# Patient Record
Sex: Male | Born: 1950
Health system: Southern US, Community
[De-identification: ages and names within clinical notes are randomized; demographics above are authoritative.]

## PROBLEM LIST (undated history)

## (undated) DIAGNOSIS — I2510 Atherosclerotic heart disease of native coronary artery without angina pectoris: Secondary | ICD-10-CM

## (undated) DIAGNOSIS — N529 Male erectile dysfunction, unspecified: Secondary | ICD-10-CM

## (undated) DIAGNOSIS — I10 Essential (primary) hypertension: Secondary | ICD-10-CM

## (undated) DIAGNOSIS — I4891 Unspecified atrial fibrillation: Secondary | ICD-10-CM

## (undated) DIAGNOSIS — I219 Acute myocardial infarction, unspecified: Secondary | ICD-10-CM

## (undated) DIAGNOSIS — E782 Mixed hyperlipidemia: Secondary | ICD-10-CM

## (undated) DIAGNOSIS — I499 Cardiac arrhythmia, unspecified: Secondary | ICD-10-CM

## (undated) DIAGNOSIS — Z950 Presence of cardiac pacemaker: Secondary | ICD-10-CM

## (undated) HISTORY — DX: Atherosclerotic heart disease of native coronary artery without angina pectoris: I25.10

## (undated) HISTORY — DX: Mixed hyperlipidemia: E78.2

## (undated) HISTORY — PX: CATARACT EXTRACTION: SUR2

## (undated) HISTORY — DX: Essential (primary) hypertension: I10

## (undated) HISTORY — DX: Acute myocardial infarction, unspecified: I21.9

## (undated) HISTORY — DX: Male erectile dysfunction, unspecified: N52.9

## (undated) HISTORY — DX: Unspecified atrial fibrillation: I48.91

---

## 1997-11-22 ENCOUNTER — Ambulatory Visit (HOSPITAL_COMMUNITY): Admission: RE | Admit: 1997-11-22 | Discharge: 1997-11-22 | Payer: Self-pay | Admitting: Internal Medicine

## 1999-05-07 DIAGNOSIS — I219 Acute myocardial infarction, unspecified: Secondary | ICD-10-CM

## 1999-05-07 HISTORY — PX: CORONARY ARTERY BYPASS GRAFT: SHX141

## 1999-05-07 HISTORY — DX: Acute myocardial infarction, unspecified: I21.9

## 1999-10-02 ENCOUNTER — Inpatient Hospital Stay (HOSPITAL_COMMUNITY): Admission: EM | Admit: 1999-10-02 | Discharge: 1999-10-09 | Payer: Self-pay | Admitting: Cardiology

## 1999-10-04 ENCOUNTER — Encounter: Payer: Self-pay | Admitting: Cardiothoracic Surgery

## 1999-10-05 ENCOUNTER — Encounter: Payer: Self-pay | Admitting: Cardiothoracic Surgery

## 1999-10-06 ENCOUNTER — Encounter: Payer: Self-pay | Admitting: Cardiothoracic Surgery

## 1999-10-07 ENCOUNTER — Encounter: Payer: Self-pay | Admitting: Cardiothoracic Surgery

## 2004-07-20 ENCOUNTER — Ambulatory Visit: Payer: Self-pay | Admitting: Cardiology

## 2004-08-03 ENCOUNTER — Ambulatory Visit: Payer: Self-pay | Admitting: Internal Medicine

## 2005-09-02 ENCOUNTER — Ambulatory Visit: Payer: Self-pay | Admitting: Cardiology

## 2005-09-12 ENCOUNTER — Ambulatory Visit: Payer: Self-pay | Admitting: Cardiology

## 2005-10-31 ENCOUNTER — Ambulatory Visit: Payer: Self-pay | Admitting: Cardiology

## 2005-11-07 ENCOUNTER — Ambulatory Visit: Payer: Self-pay | Admitting: Cardiology

## 2006-10-13 ENCOUNTER — Ambulatory Visit: Payer: Self-pay | Admitting: Physician Assistant

## 2006-10-13 ENCOUNTER — Encounter: Payer: Self-pay | Admitting: Cardiology

## 2007-07-28 ENCOUNTER — Encounter: Payer: Self-pay | Admitting: Cardiology

## 2008-03-30 ENCOUNTER — Ambulatory Visit: Payer: Self-pay | Admitting: Cardiology

## 2008-03-30 ENCOUNTER — Encounter: Payer: Self-pay | Admitting: Physician Assistant

## 2008-04-07 ENCOUNTER — Encounter: Payer: Self-pay | Admitting: Physician Assistant

## 2008-04-08 ENCOUNTER — Ambulatory Visit: Payer: Self-pay | Admitting: Cardiology

## 2009-01-04 DIAGNOSIS — E785 Hyperlipidemia, unspecified: Secondary | ICD-10-CM

## 2009-01-04 DIAGNOSIS — I10 Essential (primary) hypertension: Secondary | ICD-10-CM | POA: Insufficient documentation

## 2009-01-04 DIAGNOSIS — I2510 Atherosclerotic heart disease of native coronary artery without angina pectoris: Secondary | ICD-10-CM

## 2009-03-06 ENCOUNTER — Telehealth (INDEPENDENT_AMBULATORY_CARE_PROVIDER_SITE_OTHER): Payer: Self-pay | Admitting: *Deleted

## 2009-05-16 ENCOUNTER — Encounter: Payer: Self-pay | Admitting: Cardiology

## 2009-05-22 ENCOUNTER — Ambulatory Visit: Payer: Self-pay | Admitting: Cardiology

## 2009-05-22 DIAGNOSIS — Z951 Presence of aortocoronary bypass graft: Secondary | ICD-10-CM

## 2010-06-05 NOTE — Assessment & Plan Note (Signed)
Summary: 1 YR FU -SRS   Visit Type:  Follow-up Primary Provider:  Dr Fara Chute  CC:  follow-up visit.  History of Present Illness: the patient is a 60 year old male with a history of coronary artery bypass grafting in May of 2001.  He status post out of hospital in inferior wall myocardial infarction.  Ejection fraction was 45% by Cardiolite in 2007.the patient is a history of hypertension and dyslipidemia his last cholesterol levels we have available demonstrate an HDL of 28 and a LDL of 116.  His EKG demonstrates old inferior Q waves.  The patient does not smoke.  He denies any chest pain shortness of breath orthopnea or PND.he has good exercise tolerance.  He reports no orthopnea PND palpitations or syncope.  He has a prior history of hypertension but reports that his blood pressure has been well-controlled with home monitoring.  Clinical Review Panels:  CXR CXR results Previous median sternotomy and coronary bypass grafting         have been performed.  The cardiac silhouette is at the upper range         normal size.  Lungs are free of infiltrates.  No pleural disease is         evident.  Bones appear average for age.                   IMPRESSION:         No acute or active process is identified. (07/04/2008)    Preventive Screening-Counseling & Management  Alcohol-Tobacco     Smoking Status: never  Current Medications (verified): 1)  Multivitamins  Tabs (Multiple Vitamin) .... Take 1 Tablet By Mouth Once A Day 2)  Niaspan 500 Mg Cr-Tabs (Niacin (Antihyperlipidemic)) .... Take 1 Tablet By Mouth Once A Day 3)  Vytorin 10-40 Mg Tabs (Ezetimibe-Simvastatin) .... Take 1 Tablet By Mouth Once A Day 4)  Metoprolol Succinate 50 Mg Xr24h-Tab (Metoprolol Succinate) .... Take 1 1/2 Tablet By Mouth Daily 5)  Lisinopril-Hydrochlorothiazide 20-25 Mg Tabs (Lisinopril-Hydrochlorothiazide) .... Take 1 Tablet By Mouth Once A Day 6)  Nitrostat 0.4 Mg Subl (Nitroglycerin) .... Place 1 Tablet  Under Tongue As Directed 7)  Aspirin 81 Mg Tbec (Aspirin) .... Take One Tablet By Mouth Daily  Allergies: 1)  Acetaminophen (Acetaminophen)  Comments:  Nurse/Medical Assistant: The patient's medications were reviewed with the patient and were updated in the Medication List. Pt brought list of medications to office visit. Cyril Loosen, RN, BSN (May 22, 2009 1:55 PM)  Past History:  Past Medical History: Last updated: 01/04/2009 CAD, NATIVE VESSEL (ICD-414.01) HYPERTENSION, UNSPECIFIED (ICD-401.9) HYPERLIPIDEMIA-MIXED (ICD-272.4) Erectile dysfunction  Family History: Last updated: 05/22/2009 noncontributory  Social History: Last updated: 05/22/2009 Full Time Tobacco Use - No.   Risk Factors: Smoking Status: never (05/22/2009)  Family History: Reviewed history and no changes required. noncontributory  Social History: Reviewed history and no changes required. Full Time Tobacco Use - No.  Smoking Status:  never  Review of Systems  The patient denies fatigue, malaise, fever, weight gain/loss, vision loss, decreased hearing, hoarseness, chest pain, palpitations, shortness of breath, prolonged cough, wheezing, sleep apnea, coughing up blood, abdominal pain, blood in stool, nausea, vomiting, diarrhea, heartburn, incontinence, blood in urine, muscle weakness, joint pain, leg swelling, rash, skin lesions, headache, fainting, dizziness, depression, anxiety, enlarged lymph nodes, easy bruising or bleeding, and environmental allergies.    Vital Signs:  Patient profile:   60 year old male Height:      74 inches  Weight:      223.50 pounds BMI:     28.80 Pulse rate:   72 / minute BP sitting:   142 / 78  (left arm) Cuff size:   regular  Vitals Entered By: Cyril Loosen, RN, BSN (May 22, 2009 1:49 PM)  Nutrition Counseling: Patient's BMI is greater than 25 and therefore counseled on weight management options. CC: follow-up visit   Physical Exam  Additional  Exam:  General: Well-developed, well-nourished in no distress head: Normocephalic and atraumatic eyes PERRLA/EOMI intact, conjunctiva and lids normal nose: No deformity or lesions mouth normal dentition, normal posterior pharynx neck: Supple, no JVD.  No masses, thyromegaly or abnormal cervical nodes lungs: Normal breath sounds bilaterally without wheezing.  Normal percussion.  Chest: Well-healed median sternotomy scar. heart: regular rate and rhythm with normal S1 and S2, no S3 or S4.  PMI is normal.  No pathological murmurs abdomen: Normal bowel sounds, abdomen is soft and nontender without masses, organomegaly or hernias noted.  No hepatosplenomegaly musculoskeletal: Back normal, normal gait muscle strength and tone normal pulsus: Pulse is normal in all 4 extremities Extremities: No peripheral pitting edema neurologic: Alert and oriented x 3 skin: Intact without lesions or rashes cervical nodes: No significant adenopathy psychologic: Normal affect    EKG  Procedure date:  05/22/2009  Findings:      normal sinus rhythm possible left atrial large medications old inferior wall myocardial infarction age undetermined no acute ischemic changes  Impression & Recommendations:  Problem # 1:  CORONARY ARTERY BYPASS GRAFT, HX OF (ICD-V45.81) although the patient is asymptomatic, I have recommended a Cardiolite stress study or stress echocardiogram.  The patient is currently refusing any further testing.  He will consider taking the test next year.  He will continue with aggressive risk factor modification, therapeutic lifestyle changes and controlled dyslipidemia.  Problem # 2:  HYPERTENSION, UNSPECIFIED (ICD-401.9) porta patient reports his blood pressure is been under good control.  He remains compliant with his medical regimen. The following medications were removed from the medication list:    Halfprin 162 Mg Tbec (Aspirin) .Marland Kitchen... Take 1 tablet by mouth once a day    Toprol Xl 50 Mg  Xr24h-tab (Metoprolol succinate) .Marland Kitchen... Take 1 1/2 tablet by mouth once a day His updated medication list for this problem includes:    Metoprolol Succinate 50 Mg Xr24h-tab (Metoprolol succinate) .Marland Kitchen... Take 1 1/2 tablet by mouth daily    Lisinopril-hydrochlorothiazide 20-25 Mg Tabs (Lisinopril-hydrochlorothiazide) .Marland Kitchen... Take 1 tablet by mouth once a day    Aspirin 81 Mg Tbec (Aspirin) .Marland Kitchen... Take one tablet by mouth daily  Problem # 3:  HYPERLIPIDEMIA-MIXED (ICD-272.4) last LDL was 116 mg percent.  At this point I recommended to the patient not to further increase medications as he tolerated Niaspan Vytorin.  Increasing simvastatin 80 mg a day will confer high risk for liver function abnormalities. The following medications were removed from the medication list:    Crestor 20 Mg Tabs (Rosuvastatin calcium) .Marland Kitchen... Take 1 tablet by mouth at bedtime His updated medication list for this problem includes:    Niaspan 500 Mg Cr-tabs (Niacin (antihyperlipidemic)) .Marland Kitchen... Take 1 tablet by mouth once a day    Vytorin 10-40 Mg Tabs (Ezetimibe-simvastatin) .Marland Kitchen... Take 1 tablet by mouth once a day  Other Orders: EKG w/ Interpretation (93000)  Patient Instructions: 1)  Your physician recommends that you continue on your current medications as directed. Please refer to the Current Medication list given to you today. 2)  Follow up in 1 year.

## 2010-09-18 NOTE — Assessment & Plan Note (Signed)
[facility]                          [facility] OFFICE NOTE   Michael Melton, Michael Melton                     MRN:          [id number]  DATE:03/30/2008                            DOB:          Jun 25, 1950    REASON FOR VISIT:  Annual followup.   Michael Melton denies any interim development of signs/symptoms suggestive  of unstable angina pectoris.  He continues to remain active both at his  job and at home.  He does, however, complain of left elbow discomfort  which has persisted for the past 3-4 months.  However, this is not  exacerbated by any exertional activity.  There appears to be some focal  point tenderness on the medial aspect.   The patient's last ischemic workup was an adequate exercise stress  Cardiolite test in May 2007.  This was felt to be a low-risk study; EF  45%.   EKG today indicates NSR at 64 bpm with nonspecific ST abnormalities.   CURRENT MEDICATIONS:  1. Niaspan 500 daily.  2. Vytorin 10/40 daily.  3. Coated aspirin.  4. Toprol-XL 75 daily.   PHYSICAL EXAMINATION:  VITAL SIGNS:  Blood pressure 176/96, pulse 63 and  regular, and weight 224 (up 4).  GENERAL:  [name], sitting upright, no distress.  HEENT:  Normocephalic and atraumatic.  NECK:  Palpable carotid pulses without bruits.  LUNGS:  Clear to auscultation in all fields.  HEART:  Regular rate and rhythm.  No significant murmurs.  ABDOMEN:  Soft, nontender, intact bowel sounds.  EXTREMITIES:  Palpable pulses with trace pedal edema.  NEUROLOGIC:  No focal deficit.   IMPRESSION:  1. Multivessel coronary artery disease.      a.     Status post out of hospital inferior myocardial infarction.      b.     Subsequent five-vessel CABG, May 2001.      c.     Adequate, low-risk exercise stress Cardiolite; EF 45%, May       2007.  2. Hypertension, controlled.  3. Dyslipidemia.   PLAN:  1. Adjust medications with addition of lisinopril/hydrochlorothiazide  10/12.5 mg daily for more aggressive blood pressure control.  Down      titrate aspirin initially to 162 daily, and ultimately 81 mg daily.      Provide prescription for p.r.n. nitroglycerin.  2. Follow up blood pressure check in 1 week, with a BMET at that time      for monitoring of electrolytes and renal function.  3. Schedule fasting lipid/liver profile for reassessment of lipid      status.  4. Schedule return clinic followup with myself and Dr. [name] in 6      months.      Michael Serpe, PA-C  Electronically Signed      [name], MD,FACC  Electronically Signed   GS/MedQ  DD: 03/30/2008  DT: 03/31/2008  Job #: [id number]   cc:   Fara Chute

## 2010-09-18 NOTE — Assessment & Plan Note (Signed)
[facility]                          [facility] OFFICE NOTE   GULED, GAHAN                     MRN:          [id number]  DATE:10/13/2006                            DOB:          1950-07-18    PRIMARY CARE PHYSICIAN:  Dr. Neita Carp.   PRIMARY CARDIOLOGIST:  Dr. [name].   HISTORY OF PRESENT ILLNESS:  Mr. Orton is a 60 year old male patient  with a history of coronary artery disease, status post inferior  myocardial infarction in 2001 with subsequent coronary artery bypass  grafting in May of 2001, who returns today for routine followup. He was  last seen in the office in June of 2007. Prior to that he had a  Cardiolite study performed. This was felt to be a low risk study. The  patient had partially reversible anterior defect of moderate size which  could represent ischemia. The patient was asymptomatic at that time and  thus was felt to be low risk. He has done well since we saw him last  year. He denies any chest discomfort or shortness of breath. Denies any  syncope. Denies any problems with edema. Denies any cough.   CURRENT MEDICATIONS:  1. Multivitamin daily.  2. Toprol XL 50 mg daily.  3. Niaspan 500 mg daily.  4. Aspirin 162 mg every 2 to 3 days.  5. Vytorin 10/80 mg daily.  6. Lisinopril 10 mg daily- he has been out of this for the last couple      of days.  7. Cialis 10 mg p.r.n.   ALLERGIES:  TYLENOL.   SOCIAL HISTORY:  Denies any tobacco abuse. He does drink quite a bit of  caffeine and does admit to some dietary salt indiscretion.   PHYSICAL EXAMINATION:  He is well-nourished, well-developed male in no  acute distress. Blood pressure 163/102, repeat blood pressure my me on  right is 150/104, on the left 158/102, manual cuff, heart rate was 81.  HEENT: Normal.  NECK: Without JVD. Carotids without bruits bilaterally.  CARDIAC: Normal S1, S2. Regular rate and rhythm without murmurs.  LUNGS: Clear to auscultation  bilaterally without wheezing, rhonchi or,  rales.  ABDOMEN: Soft nontender with normoactive bowel sounds. No organomegaly.  EXTREMITIES: Without edema, calves soft, nontender.  SKIN: Warm and dry.  NEUROLOGIC: He is alert and oriented x3. Cranial nerves II-XII grossly  intact. Dorsalis pedis and posterior tibialis pulses are 2 +  bilaterally.   Electrocardiogram reveals sinus rhythm with a heart rate of 77, leftward  axis, inferior Q waves. No significant change since previous tracing.   IMPRESSION:  1. Coronary artery disease.      a.     Status post inferior myocardial infarction in 2001.      b.     Status post coronary artery bypass grafting in May of 2001:       LIMA to the LAD right internal mammary to the first obtuse       marginal, saphenous vein graft to the obtuse marginal, saphenous       vein graft to the diagonal, saphenous to posterior descending  artery.      c.     Cardiolite study May 2007 revealing possible anterior       ischemia- felt to be low risk/the patient was asymptomatic.  2. Hypertension, uncontrolled.  3. Dyslipidemia.  4. Erectile dysfunction.   PLAN:  The patient presents to the office today for follow up. Overall  from a cardiovascular standpoint he is doing well. He does need better  blood pressure control. I have increased his Toprol to 75 mg a day. I  have also asked him to get back on the lisinopril at 10 mg a day. He has  a blood pressure machine at work and he is going to keep track on his  blood pressures. If they are greater than or equal to 160/100 he is to  contact us. Otherwise he will return to the office in about 2 to 3 weeks  for blood pressure check with the nurse. If he continues to have  elevated blood pressures I would rather to try to go up on his  lisinopril then to add a diuretic, as he does work at a [employer] in  extremely hot conditions. He does need a follow up on his lipids and  LFTs. We will arrange that. He also  is requesting a refill on his  Cialis. This was prescribed to him by Roxanne Mins, PA-C some years  ago. I have refilled that for him today but have asked him to see if his  primary care physician will refill it for him in the future. I did warn  him about the contra-indications of nitroglycerin with Cialis. He will  return for follow up in 4 months to check his blood pressure and review  the results of his lipid panel.      Tereso Newcomer, PA-C  Electronically Signed      Luis Abed, MD, [facility]  Electronically Signed   SW/MedQ  DD: 10/13/2006  DT: 10/13/2006  Job #: [id number]   cc:   Fara Chute

## 2010-09-21 NOTE — Op Note (Signed)
[place]  Patient:    Michael Melton, Michael Melton                     MRN: [id number] Proc. Date: 10/04/99 Adm. Date:  [id number] Attending:  Mikey Melton CC:         CVTS office             Michael Melton. Michael Melton, M.D.             Michael Melton                           Operative Report  PREOPERATIVE DIAGNOSIS:  Class 4 post-infarction unstable angina, status post recent diaphragmatic myocardial infarct.  POSTOPERATIVE DIAGNOSIS:  Class 4 post-infarction unstable angina, status post recent diaphragmatic myocardial infarct.  OPERATION:  Coronary artery bypass grafting x 5 (left internal mammary artery to LAD, free right internal mammary artery to OM1, saphenous vein graft to OM2, saphenous vein graft to diagonal, saphenous vein graft to posterior descending branch of the right coronary).  SURGEON:  Michael Melton, M.D.  ASSISTANT:  Michael Melton, P.A.-C. and Michael Melton, P.A.-C.  ANESTHESIA:  General by Michael Melton. Michael Melton, M.D.  INDICATIONS:  The patient is a 60 year old white male who presented 48 hours after an episode of acute chest pain and ruled in for MI.  He was stabilized with heparin and nitroglycerin, and underwent diagnostic cath which demonstrated severe three vessel disease including total occlusion of the right coronary, and mild inferior wall hypokinesia.  He was referred for coronary revascularization.  Prior to the operation, I examined the patient in his hospital room following the cath, and reviewed the results of the catheterization with the patient and his family.  I discussed the indications and expected benefits of coronary artery bypass grafting, as well as the alternatives to surgical therapy for his coronary disease.  I reviewed the major aspects of the operation including the choice of conduit, the location of the surgical incisions, use of cardiopulmonary bypass and general anesthesia, and the expected  hospital recovery.  I reviewed the associated risks of the operation including the risks of MI, CVA, bleeding, infection, and death.  He understood the indications for surgery and agreed to proceed with the operation as planned under informed consent.  OPERATIVE FINDINGS:  The patients heart is mildly dilated.  His mammary arteries were approximately 1.5 mm to 2 mm with good flow.  The saphenous vein was of average to above average quality.  The inferior wall had recent evidence of mild infarct.  The right coronary was diffusely diseased and the graft to the right was placed distally in the posterior descending.  For that reason, the right mammary artery was used to the large first circumflex vessel.  DESCRIPTION OF PROCEDURE:  The patient was brought to the operating room and placed supine on the operating room table.  General anesthesia was induced under invasive and hemodynamic monitoring.  The chest, abdomen, and legs were prepped with Betadine and draped as a sterile field.  A median sternotomy was performed and both internal mammary arteries were harvested as pedicle grafts from the subclavian vessels.  The vein was harvested from the right lower extremity.  Heparin was administered and the ACT was documented as being therapeutic.  The patient was placed on cardiopulmonary bypass after being cannulated to the ascending aorta and right atrium.  The heart was inspected and  found to have inferior wall edema and evidence of recent mild infarct. The coronaries were identified and the mammary arteries were prepared for the distal anastomoses.  A cardioplegia cannula was placed and the patient was cooled to 28 degrees.  As the aortic cross-clamp was applied, 500 cc of cold blood cardioplegia was delivered to the aortic root with immediate cardioplegic arrest and septal temperature dropping less than 12 degrees. ________ saline slush was used to augment myocardial preservation and  a pericardial insufflator pad was used to protect the left phrenic nerve.  The distal coronary anastomoses were then performed.  The first distal anastomosis was to the first diagonal.  It was a 1.8 mm vessel with proximal 90% stenosis and a reverse saphenous vein was sewn end-to-side with a running 7-0 Prolene.  The second distal anastomosis was to the posterior descending which was a 1.8 mm vessel with heavy proximal calcification and total occlusion.  A reverse saphenous vein was sewn end-to-side with a running 7-0 Prolene.  There was good flow through the graft.  The third distal anastomosis was to the OM2 which was a 1.5 mm vessel with proximal 90% stenosis, and reverse saphenous vein was sewn to this vessel with a running 7-0 Prolene with good flow through the graft.  The cardioplegia was redosed.  The fourth distal anastomosis was a free right mammary artery to the OM1 which is a large 1.8 mm vessel with proximal 90% stenosis.  An end-to-side anastomosis using running 8-0 Prolene was constructed with good flow through the graft.  The fifth distal anastomosis was to the mid LAD which was a 2.0 mm vessel with proximal 90% stenosis.  The left internal mammary artery pedicle was brought through an opening created in the left lateral pericardium.  It was brought down on the LAD and sewn end-to-side with a running 8-0 Prolene.  There was excellent flow through the anastomosis with immediate rise in septal temperature after release of the pedicle clamp on the mammary artery.  The mammary and pedicle was secured to the epicardium and the aortic cross-clamp was removed.  The heart was noted to resume a spontaneous rhythm.  A partial occluding clamp was placed on the ascending aorta and three proximal vein anastomoses were performed using a 4.0 mm punch and running 6-0 Prolene.  The ascending aorta did have some thickening and atherosclerotic changes.  The partial clamp was removed  and the vein grafts were perfused.  The free right mammary graft was  sewn to the proximal portion of the vein graft to the circumflex marginal #2. This was constructed with a running 7-0 Prolene in an end-to-side fashion between the mammary and the front of the vein.  The partial vascular clamps were removed from the vein and there was excellent flow through all the grafts.  The patient was rewarmed to 37 degrees and temporary pacing wires were applied.  When the patient reached 37 degrees, the lungs re-expanded and the ventilator was resumed.  The patient weaned from cardiopulmonary bypass without inotropics and without blood products.  Protamine was administered and the cannulas were removed.  The mediastinum was irrigated with warm antibiotic irrigation and hemostasis was adequate.  The leg incision was irrigated and closed in the standard fashion.  The pericardium was loosely reapproximated superiorly over the aorta and vein grafts.  Two mediastinal and two pleural chest tubes were placed and brought out through separate incisions.  The sternum was reapproximated with interrupted steel wire.  The pectoralis fascia  and subcutaneous layers were closed with a running Vicryl.  The skin was closed with staples and sterile dressings were applied.  Total cardiopulmonary bypass time was 165 minutes with aortic cross-clamp time of 60 minutes. DD:  10/04/99 TD:  10/09/99 Job: [id number]

## 2010-09-21 NOTE — Discharge Summary (Signed)
[place]  Patient:    Michael Melton, Michael Melton                     MRN: [id number] Adm. Date:  [id number] Disc. Date: [id number] Attending:  [name] Dictator:   Eugenia Pancoast, P.A. CC:         Madolyn Frieze. Jens Som, M.D. [facility]             [name], M.D.             [facility], [place], [place]             Luis Abed, M.D. [facility]                           Discharge Summary  DATE OF BIRTH:  Oct 02, 1980  ADDENDUM:  While the patient was in the hospital, he was noted to have elevated blood sugars from 126-146 range and, because of this he was referred to [facility] for diabetes nutrition management.  This is for control of new onset of diabetes. DD:  10/08/99 TD:  10/11/99 Job: [id number]

## 2010-09-21 NOTE — Discharge Summary (Signed)
[place]. [facility]  Patient:    Michael Melton, Michael Melton                     MRN: [id number] Adm. Date:  [id number] Disc. Date: [id number] Attending:  [name] Dictator:   Eugenia Pancoast, P.A. CC:         [name], M.D.             [facility],             [place], [place]             Luis Abed, M.D. [facility]             Thomas D. Riley Kill, M.D. [facility]                           Discharge Summary  DATE OF BIRTH:  November 27, 1950  FINAL DIAGNOSIS:  Coronary artery disease.  PROCEDURES:  Oct 03, 1999, left heart catheterization per Dr. Arturo Morton. Riley Kill, M.D.  Oct 04, 1999, coronary artery bypass x 5 with the left internal mammary artery to the left anterior descending, free right internal mammary artery to the first obtuse marginal, a saphenous vein graft to the second obtuse marginal, a saphenous vein graft to the diagonal, a saphenous vein graft to the posterior descending artery.  SURGEON:  [name], M.D.  HISTORY OF PRESENT ILLNESS:  This is a 60 year old gentleman without any prior history of hypertension, diabetes, or elevated lipids, or tobacco use.  He has no prior history of any cardiac problems.  He presents to the emergency room at [facility] after onset of substernal epigastric pain "like gas."  On that day, patient had had six hot dogs and two loaded burgers at about 3:30 p.m. that day.  Later that evening, the above symptoms occurred. Patient took Rolaids without relief.   He had profuse diaphoresis, some shortness of breath, and he vomited x 1 at 6:00 a.m. on Sunday.  He had some weakness and left shoulder pain.  He rested on Sunday and took an aspirin x 1, and then he went to work on Monday.  But, because of persistent left shoulder discomfort, he sought medical attention with [facility].  At that time, an EKG was done which showed Q waves in II, III, and aVF.  Because of  these occurrences, patient was transferred to [facility].  Patient was subsequently admitted to [facility] on Oct 02, 1999.  HOSPITAL COURSE:  Patient was admitted and on the following day, Oct 03, 1999, he underwent a left heart catheterization which showed significant three-vessel disease.  Because of these findings, he was referred to Dr. Donata Clay for surgical revascularization.  Patient had a total right coronary artery.  He had mild inferior hypokinesis and significant three vessel disease with a 70% proximal stenosis of the LAD and 80% proximal stenosis of the circumflex.  Because of these findings, as noted, he was referred to Dr. Donata Clay.  Dr. Donata Clay saw the patient.  He discussed the surgery.  Risks and benefits were explained to the patient.  The patient understood and agreed to surgery.  On Oct 04, 1999, patient underwent coronary artery bypass x 5 with the LIMA to the LAD, a free RIMA to the first obtuse marginal, a saphenous vein graft to the second obtuse marginal,  a saphenous vein graft to the diagonal branch, and saphenous vein graft to the posterior descending artery.  Patient tolerated the procedure well.  No intraoperative complications occurred.  Postoperatively, patient did satisfactorily.  He was extubated on the operative day.  He progressed in a satisfactory manner.  On the first postoperative day, he was stable without any complaints at that time.  He continued to progress in a satisfactory manner.  He was transferred to the step-down unit on the second postoperative day.  There, he continued to do well, went through his cardiac rehab phase without difficulty.  His incision was healing satisfactorily.  He did have some mild sinus tachycardia noted on postoperative day #3 but this resolved.  He remained in sinus rhythm throughout his stay.  No other untoward events occurred during his stay.  He progressed well, tolerating his diet.  By October 08, 1999, he was prepared for discharge.  DISCHARGE MEDICATIONS: 1. Lopressor 50 mg one-half tablet q.12h. 2. Enteric-coated aspirin 325 mg q.d. 3. Multivitamin with zinc and folic acid one q.d. 3. Tylox one to two p.o. q.4-6h. p.r.n. pain.  FOLLOW-UP:  Patient will follow up with Dr. Myrtis Ser in [place], [place] on October 23, 1999.  He will come to Dr. Vincent Gros office on October 16, 1999, for staple removal.  He will then see Dr. Donata Clay in approximately three weeks.  CONDITION ON DISCHARGE:  Patient is subsequently discharged home in satisfactory and stable condition on October 09, 1999.DD:  10/08/99 TD:  10/10/99 Job: [id number]

## 2010-10-30 ENCOUNTER — Encounter: Payer: Self-pay | Admitting: Cardiology

## 2010-11-01 ENCOUNTER — Other Ambulatory Visit: Payer: Self-pay | Admitting: *Deleted

## 2010-11-01 MED ORDER — LISINOPRIL-HYDROCHLOROTHIAZIDE 20-25 MG PO TABS: 1.0000 | ORAL_TABLET | Freq: Every day | ORAL | Status: DC

## 2011-04-03 ENCOUNTER — Other Ambulatory Visit: Payer: Self-pay | Admitting: Cardiology

## 2011-06-03 ENCOUNTER — Encounter: Payer: Self-pay | Admitting: *Deleted

## 2011-06-03 ENCOUNTER — Ambulatory Visit (INDEPENDENT_AMBULATORY_CARE_PROVIDER_SITE_OTHER): Payer: Self-pay | Admitting: Cardiology

## 2011-06-03 VITALS — BP 152/93 | HR 60 | Ht 74.0 in | Wt 215.0 lb

## 2011-06-03 DIAGNOSIS — I219 Acute myocardial infarction, unspecified: Secondary | ICD-10-CM

## 2011-06-03 DIAGNOSIS — I10 Essential (primary) hypertension: Secondary | ICD-10-CM

## 2011-06-03 DIAGNOSIS — I2510 Atherosclerotic heart disease of native coronary artery without angina pectoris: Secondary | ICD-10-CM

## 2011-06-03 DIAGNOSIS — Z951 Presence of aortocoronary bypass graft: Secondary | ICD-10-CM

## 2011-06-03 NOTE — Patient Instructions (Signed)
Continue all current medications. Your physician wants you to follow up in:  1 year.  You will receive a reminder letter in the mail one-two months in advance.  If you don't receive a letter, please call our office to schedule the follow up appointment   

## 2011-06-08 ENCOUNTER — Encounter: Payer: Self-pay | Admitting: Cardiology

## 2011-06-08 DIAGNOSIS — Z951 Presence of aortocoronary bypass graft: Secondary | ICD-10-CM | POA: Insufficient documentation

## 2011-06-08 DIAGNOSIS — I10 Essential (primary) hypertension: Secondary | ICD-10-CM | POA: Insufficient documentation

## 2011-06-08 DIAGNOSIS — I219 Acute myocardial infarction, unspecified: Secondary | ICD-10-CM | POA: Insufficient documentation

## 2011-06-08 NOTE — Progress Notes (Signed)
Michael Bottoms, MD, [facility] Board Certified in Adult Cardiovascular Medicine,Internal Medicine and Critical Care Medicine    CC: Follow up patient with coronary artery disease  HPI:  The patient is a 61 year old male with history of coronary artery bypass grafting in May of 2001.  He has an ejection fraction of 45%.  He had a nonischemic Cardiolite in 2007.  Since that time he has declined any further stress testing.  Most recently last year he declined a stress test.  He reports however no symptoms of chest pain or shortness of breath orthopnea or PND.  He has no palpitations or syncope.  He has good exercise tolerance.  He states that his blood pressure is well controlled at home.   PMH: reviewed and listed in Problem List in Electronic Records (and see below) Past Medical History  Diagnosis Date  . CAD in native artery   . Unspecified essential hypertension   . Hyperlipidemia, mixed   . Erectile dysfunction   . Status post coronary artery bypass grafting     May 2001  . Myocardial infarction     Out of hospital myocardial infarction/inferior wall, status post Cardiolite study 2007.  Ejection fraction 45%  . Hypertension    No past surgical history on file.  Allergies/SH/FHX : available in Electronic Records for review  Allergies  Allergen Reactions  . Acetaminophen     REACTION: hallucinations   History   Social History  . Marital Status: Married    Spouse Name: N/A    Number of Children: N/A  . Years of Education: N/A   Occupational History  . Full time [employer]   Social History Main Topics  . Smoking status: Never Smoker   . Smokeless tobacco: Never Used  . Alcohol Use: Not on file  . Drug Use: Not on file  . Sexually Active: Not on file   Other Topics Concern  . Not on file   Social History Narrative  . No narrative on file   No family history on file.  Medications: Current Outpatient Prescriptions  Medication Sig Dispense Refill  . aspirin 81  MG tablet Take 81 mg by mouth daily.        Marland Kitchen CIALIS 10 MG tablet Take 1 tablet by mouth Once daily as needed.      . ezetimibe-simvastatin (VYTORIN) 10-40 MG per tablet Take 1 tablet by mouth daily.        Marland Kitchen lisinopril-hydrochlorothiazide (PRINZIDE,ZESTORETIC) 20-25 MG per tablet Take 1 tablet by mouth daily. PATIENT NEED OFFICE VISIT  90 tablet  0  . metoprolol (TOPROL-XL) 50 MG 24 hr tablet TAKE 1 AND 1/2 TABLETS     DAILY  45 tablet  0  . Multiple Vitamin (MULTIVITAMIN) tablet Take 1 tablet by mouth daily. Doesn't take this everyday      . niacin (NIASPAN) 500 MG CR tablet Take 500 mg by mouth daily.        . nitroGLYCERIN (NITROSTAT) 0.4 MG SL tablet Place 0.4 mg under the tongue every 5 (five) minutes as needed.         ROS: No nausea or vomiting. No fever or chills.No melena or hematochezia.No bleeding.No claudication  Physical Exam: BP 152/93  Pulse 60  Ht 6\' 2"  (1.88 m)  Wt 215 lb (97.523 kg)  BMI 27.60 kg/m2 General:well-nourished white male in no distress Neck:normal carotid upstroke.  No carotid bruits.  No thyromegaly.  Non-nodular thyroid Lungs:clear breath sounds bilaterally Cardiac:regular rate and rhythm  with normal S1 and S2 and no murmur rubs or gallops Vascular:no edema.  Normal distal pulses Skin:warm and dry Physcologic:normal affect  12lead ZOX:WRUEAV sinus rhythm and poor R-wave progression with Q waves in leads 23 and aVF consistent with an old inferior wall myocardial infarction Limited bedside ECHO:ejection fraction is 60% without definite wall motion abnormalities   Patient Active Problem List  Diagnoses  . HYPERLIPIDEMIA-MIXED  . HYPERTENSION, UNSPECIFIED  . CAD, NATIVE VESSEL  . CORONARY ARTERY BYPASS GRAFT, HX OF  . Hypertension  . Myocardial infarction  . Status post coronary artery bypass grafting    PLAN   Patient remains asymptomatic.  We will continue risk factor modification.  He again declined any further stress testing  We did  perform a limited bedsideechocardiogram demonstrating normal LV function and no wall motion abnormalities.  I asked the patient to monitor his blood pressure carefully at home as again it was elevated in the office today.  However he assures me that his blood pressure readings have been normal at home.

## 2012-09-03 ENCOUNTER — Ambulatory Visit: Payer: 59 | Admitting: Cardiology

## 2012-09-14 ENCOUNTER — Encounter: Payer: Self-pay | Admitting: Cardiology

## 2012-09-14 ENCOUNTER — Encounter: Payer: Self-pay | Admitting: *Deleted

## 2012-09-14 ENCOUNTER — Ambulatory Visit (INDEPENDENT_AMBULATORY_CARE_PROVIDER_SITE_OTHER): Payer: 59 | Admitting: Cardiology

## 2012-09-14 VITALS — BP 137/80 | HR 69 | Ht 74.0 in | Wt 216.1 lb

## 2012-09-14 DIAGNOSIS — I10 Essential (primary) hypertension: Secondary | ICD-10-CM

## 2012-09-14 DIAGNOSIS — Z79899 Other long term (current) drug therapy: Secondary | ICD-10-CM

## 2012-09-14 DIAGNOSIS — Z951 Presence of aortocoronary bypass graft: Secondary | ICD-10-CM

## 2012-09-14 DIAGNOSIS — E785 Hyperlipidemia, unspecified: Secondary | ICD-10-CM

## 2012-09-14 DIAGNOSIS — I219 Acute myocardial infarction, unspecified: Secondary | ICD-10-CM

## 2012-09-14 DIAGNOSIS — I2510 Atherosclerotic heart disease of native coronary artery without angina pectoris: Secondary | ICD-10-CM

## 2012-09-14 MED ORDER — ATORVASTATIN CALCIUM 40 MG PO TABS: 40.0000 mg | ORAL_TABLET | Freq: Every day | ORAL | Status: DC

## 2012-09-14 NOTE — Patient Instructions (Addendum)
Your physician recommends that you schedule a follow-up appointment in: 3 months. Your physician has recommended you make the following change in your medication: STOP NIASPAN. START LIPITOR 40 MG DAILY. Your new prescription has been sent to your pharmacy. All other medications will remain the same. Your physician has requested that you have an exercise tolerance test. For further information please visit [email or web address] Please also follow instruction sheet, as given. Your physician recommends that you return for a FASTING lipid/liver profile in 10 week. Around November 23, 2012, Please have your fasting lab work done at [facility].

## 2012-09-14 NOTE — Progress Notes (Signed)
HPI The patient presents for first time visit with me.  He has a history of coronary disease with bypass grafting in 2001. His last stress perfusion study was 2007. I did review some ER records and he was recently in the emergency room with hypertension. Since that visit a couple of days ago his blood pressure has been well controlled. He otherwise has been feeling relatively well. He has had some soreness in his left upper chest that he associates with doing some work and reaching in a certain way on a ladder. He says it's sort out to move his arm in a certain direction. This is not like his previous angina. He is able reactive. He's not having any chest pressure, neck or arm discomfort consistent with angina. He's not having any palpitations, presyncope or syncope. He does have muscle aches and reports flushing with niacin.  Allergies  Allergen Reactions  . Acetaminophen     REACTION: hallucinations    Current Outpatient Prescriptions  Medication Sig Dispense Refill  . aspirin 81 MG tablet Take 81 mg by mouth daily.        Marland Kitchen CIALIS 10 MG tablet Take 1 tablet by mouth Once daily as needed.      Marland Kitchen lisinopril (PRINIVIL,ZESTRIL) 10 MG tablet Take 10 mg by mouth daily.      Marland Kitchen lisinopril-hydrochlorothiazide (PRINZIDE,ZESTORETIC) 20-25 MG per tablet Take 1 tablet by mouth daily. PATIENT NEED OFFICE VISIT  90 tablet  0  . metoprolol (TOPROL-XL) 50 MG 24 hr tablet TAKE 1 AND 1/2 TABLETS     DAILY  45 tablet  0  . Multiple Vitamin (MULTIVITAMIN) tablet Take 1 tablet by mouth daily. Doesn't take this everyday      . nitroGLYCERIN (NITROSTAT) 0.4 MG SL tablet Place 0.4 mg under the tongue every 5 (five) minutes as needed.       Marland Kitchen atorvastatin (LIPITOR) 40 MG tablet Take 1 tablet (40 mg total) by mouth daily.  90 tablet  3   No current facility-administered medications for this visit.    Past Medical History  Diagnosis Date  . CAD in native artery   . Unspecified essential hypertension   .  Hyperlipidemia, mixed   . Erectile dysfunction   . Myocardial infarction     Out of hospital myocardial infarction/inferior wall, status post Cardiolite study 2007.  Ejection fraction 45%    Past Surgical History  Procedure Laterality Date  . Coronary artery bypass graft      2001.  LIMA to LAD, SVG to OM1, SVG to OM 2, SVG to diagonal, SVG to PDA.  . Cataract extraction      ROS:  As stated in the HPI and negative for all other systems.  PHYSICAL EXAM BP 137/80  Pulse 69  Ht 6\' 2"  (1.88 m)  Wt 216 lb 1.9 oz (98.031 kg)  BMI 27.74 kg/m2  SpO2 95% GENERAL:  Well appearing HEENT:  Pupils equal round and reactive, fundi not visualized, oral mucosa unremarkable NECK:  No jugular venous distention, waveform within normal limits, carotid upstroke brisk and symmetric, no bruits, no thyromegaly LYMPHATICS:  No cervical, inguinal adenopathy LUNGS:  Clear to auscultation bilaterally BACK:  No CVA tenderness CHEST:  Well healed sternotomy scar. HEART:  PMI not displaced or sustained,S1 and S2 within normal limits, no S3, no S4, no clicks, no rubs, no murmurs ABD:  Flat, positive bowel sounds normal in frequency in pitch, no bruits, no rebound, no guarding, no midline pulsatile mass, no  hepatomegaly, no splenomegaly EXT:  2 plus pulses throughout, no edema, no cyanosis no clubbing SKIN:  No rashes no nodules NEURO:  Cranial nerves II through XII grossly intact, motor grossly intact throughout PSYCH:  Cognitively intact, oriented to person place and time  EKG:  Sinus rhythm, rate 63, old inferior infarct, poor anterior R wave progression, premature ventricular contractions, nonspecific lateral T-wave flattening. 09/04/12  ASSESSMENT AND PLAN  HYPERLIPIDEMIA-MIXED :  We spent a great deal of time discussing this and I reviewed previous records. He had been on statins in the past and these were discontinued. I cannot find that the discussion of why and he does not recall. He actually doesn't  like taking niacin and doesn't tolerate it. He says he gets hot flashes. We will try to switch him to Lipitor 40 mg daily. He can get a lipid profile and liver enzymes in about 10 weeks.  HYPERTENSION, UNSPECIFIED :  Despite the high blood pressure the other day he is doing well currently. No change in therapy is indicated.  CAD, NATIVE VESSEL/CORONARY ARTERY BYPASS GRAFT, HX OF :  He has atypical ongoing chest pain. Given this stress testing is indicated. I'm going to start with an exercise treadmill test on medications. I suspect this will be submaximal but I have a low pretest suspicion that he has disease. However, if he has ongoing pain in the future and this test has been negative he might need further testing

## 2012-09-14 NOTE — Progress Notes (Signed)
Feels some type of muscular pain in back that may be referring to chest.  No SOB or dizziness.  Does notice with movement of arms that he feels area pulling.

## 2012-09-16 DIAGNOSIS — I2510 Atherosclerotic heart disease of native coronary artery without angina pectoris: Secondary | ICD-10-CM

## 2012-09-17 ENCOUNTER — Other Ambulatory Visit: Payer: Self-pay | Admitting: Cardiology

## 2012-09-21 ENCOUNTER — Telehealth: Payer: Self-pay | Admitting: *Deleted

## 2012-09-21 NOTE — Telephone Encounter (Signed)
Message copied by Eustace Moore on Mon Sep 21, 2012 10:56 AM ------      Message from: Rollene Rotunda      Created: Sat Sep 19, 2012  9:48 AM       Stress test with no obvious ischemia but frequent ectopy.  I will see him back in two months to discuss further.  Send results to Estanislado Pandy, MD       ------

## 2012-09-21 NOTE — Telephone Encounter (Signed)
Patient informed. 

## 2012-10-05 ENCOUNTER — Other Ambulatory Visit: Payer: Self-pay | Admitting: *Deleted

## 2012-10-05 MED ORDER — LISINOPRIL 10 MG PO TABS: 10.0000 mg | ORAL_TABLET | Freq: Every day | ORAL | Status: DC

## 2012-12-31 ENCOUNTER — Encounter: Payer: Self-pay | Admitting: Cardiology

## 2012-12-31 ENCOUNTER — Ambulatory Visit (INDEPENDENT_AMBULATORY_CARE_PROVIDER_SITE_OTHER): Payer: 59 | Admitting: Cardiology

## 2012-12-31 VITALS — BP 134/88 | HR 81 | Ht 74.0 in | Wt 217.0 lb

## 2012-12-31 DIAGNOSIS — I255 Ischemic cardiomyopathy: Secondary | ICD-10-CM

## 2012-12-31 DIAGNOSIS — I219 Acute myocardial infarction, unspecified: Secondary | ICD-10-CM

## 2012-12-31 DIAGNOSIS — I10 Essential (primary) hypertension: Secondary | ICD-10-CM

## 2012-12-31 DIAGNOSIS — Z951 Presence of aortocoronary bypass graft: Secondary | ICD-10-CM

## 2012-12-31 DIAGNOSIS — I2589 Other forms of chronic ischemic heart disease: Secondary | ICD-10-CM

## 2012-12-31 NOTE — Progress Notes (Signed)
HPI The patient presents for first time visit with me.  He has a history of coronary disease with bypass grafting in 2001. His last stress perfusion study was 2007. At our first visit together I did change in the Lipitor as he was having some symptoms on his other therapy. I sent him for routine screening exercise treadmill testing and he had no evidence of ischemic ST changes though he had frequent ectopy with some NSVT. Reviewing his previous Cardiolite in 2007 his EF was said to be 47%. He has had no reassessment of this since then. He denies any chest discomfort, neck or arm discomfort. He's had no palpitations and doesn't feel the stent has had no presyncope or syncope. He has no shortness of breath, PND or orthopnea. He works aggressively in the yard.  Allergies  Allergen Reactions  . Acetaminophen     REACTION: hallucinations    Current Outpatient Prescriptions  Medication Sig Dispense Refill  . aspirin 81 MG tablet Take 81 mg by mouth daily.        Marland Kitchen atorvastatin (LIPITOR) 40 MG tablet Take 40 mg by mouth daily.      Marland Kitchen CIALIS 10 MG tablet Take 1 tablet by mouth Once daily as needed.      Marland Kitchen lisinopril (PRINIVIL,ZESTRIL) 10 MG tablet Take 1 tablet (10 mg total) by mouth daily.  30 tablet  1  . metoprolol (TOPROL-XL) 50 MG 24 hr tablet TAKE 1 AND 1/2 TABLETS     DAILY  45 tablet  0  . Multiple Vitamin (MULTIVITAMIN) tablet Take 1 tablet by mouth daily. Doesn't take this everyday      . nitroGLYCERIN (NITROSTAT) 0.4 MG SL tablet Place 0.4 mg under the tongue every 5 (five) minutes as needed.        No current facility-administered medications for this visit.    Past Medical History  Diagnosis Date  . CAD in native artery   . Unspecified essential hypertension   . Hyperlipidemia, mixed   . Erectile dysfunction   . Myocardial infarction     Out of hospital myocardial infarction/inferior wall, status post Cardiolite study 2007.  Ejection fraction 45%    Past Surgical History    Procedure Laterality Date  . Coronary artery bypass graft      2001.  LIMA to LAD, SVG to OM1, SVG to OM 2, SVG to diagonal, SVG to PDA.  . Cataract extraction      ROS:  As stated in the HPI and negative for all other systems.  PHYSICAL EXAM BP 134/88  Pulse 81  Ht 6\' 2"  (1.88 m)  Wt 217 lb (98.431 kg)  BMI 27.85 kg/m2 GENERAL:  Well appearing HEENT:  Pupils equal round and reactive, fundi not visualized, oral mucosa unremarkable NECK:  No jugular venous distention, waveform within normal limits, carotid upstroke brisk and symmetric, no bruits, no thyromegaly LYMPHATICS:  No cervical, inguinal adenopathy LUNGS:  Clear to auscultation bilaterally BACK:  No CVA tenderness CHEST:  Well healed sternotomy scar. HEART:  PMI not displaced or sustained,S1 and S2 within normal limits, no S3, no S4, no clicks, no rubs, no murmurs ABD:  Flat, positive bowel sounds normal in frequency in pitch, no bruits, no rebound, no guarding, no midline pulsatile mass, no hepatomegaly, no splenomegaly EXT:  2 plus pulses throughout, no edema, no cyanosis no clubbing SKIN:  No rashes no nodules NEURO:  Cranial nerves II through XII grossly intact, motor grossly intact throughout PSYCH:  Cognitively intact,  oriented to person place and time    ASSESSMENT AND PLAN  HYPERLIPIDEMIA-MIXED :  He tolerated the change to Lipitor. He said his labs were checked recently and he was told they were "good". He will get me a copy of this.  HYPERTENSION, UNSPECIFIED :  The blood pressure is at target. No change in medications is indicated. We will continue with therapeutic lifestyle changes (TLC).  CAD, NATIVE VESSEL/CORONARY ARTERY BYPASS GRAFT, HX OF :  He had no ischemia on his recent exercise treadmill test. He did have some PVCs and short runs of NSVT. Given this and a slightly reduced ejection fraction in the past I will reassess his EF was an echocardiogram. If this is stable or improved no change in therapy  will be indicated.

## 2012-12-31 NOTE — Patient Instructions (Addendum)

## 2013-01-07 ENCOUNTER — Other Ambulatory Visit (INDEPENDENT_AMBULATORY_CARE_PROVIDER_SITE_OTHER): Payer: 59

## 2013-01-07 DIAGNOSIS — I219 Acute myocardial infarction, unspecified: Secondary | ICD-10-CM

## 2013-01-07 DIAGNOSIS — I255 Ischemic cardiomyopathy: Secondary | ICD-10-CM

## 2013-01-07 DIAGNOSIS — I2589 Other forms of chronic ischemic heart disease: Secondary | ICD-10-CM

## 2013-01-14 ENCOUNTER — Telehealth: Payer: Self-pay | Admitting: *Deleted

## 2013-01-14 NOTE — Telephone Encounter (Signed)
Patient informed via voicemail. Copy sent to PCP 

## 2013-01-14 NOTE — Telephone Encounter (Signed)
Message copied by Eustace Moore on Thu Jan 14, 2013  1:26 PM ------      Message from: Rollene Rotunda      Created: Thu Jan 14, 2013  7:59 AM       EF is the same or slightly better than previous.  Call Mr. Reh with the results and send results to Estanislado Pandy, MD       ------

## 2013-02-02 ENCOUNTER — Other Ambulatory Visit: Payer: Self-pay | Admitting: *Deleted

## 2013-02-02 MED ORDER — METOPROLOL SUCCINATE ER 50 MG PO TB24: ORAL_TABLET | ORAL | Status: DC

## 2013-06-21 ENCOUNTER — Encounter: Payer: Self-pay | Admitting: Cardiology

## 2013-06-22 HISTORY — PX: LAPAROSCOPIC CHOLECYSTECTOMY: SUR755

## 2013-11-18 ENCOUNTER — Other Ambulatory Visit: Payer: Self-pay

## 2013-11-18 MED ORDER — ATORVASTATIN CALCIUM 40 MG PO TABS: 40.0000 mg | ORAL_TABLET | Freq: Every day | ORAL | Status: DC

## 2014-02-03 ENCOUNTER — Encounter: Payer: Self-pay | Admitting: *Deleted

## 2014-02-08 ENCOUNTER — Encounter: Payer: Self-pay | Admitting: Cardiology

## 2014-02-08 ENCOUNTER — Ambulatory Visit (INDEPENDENT_AMBULATORY_CARE_PROVIDER_SITE_OTHER): Payer: 59 | Admitting: Cardiology

## 2014-02-08 VITALS — BP 119/75 | HR 65 | Ht 74.0 in | Wt 217.0 lb

## 2014-02-08 DIAGNOSIS — I10 Essential (primary) hypertension: Secondary | ICD-10-CM

## 2014-02-08 DIAGNOSIS — E785 Hyperlipidemia, unspecified: Secondary | ICD-10-CM

## 2014-02-08 DIAGNOSIS — I2510 Atherosclerotic heart disease of native coronary artery without angina pectoris: Secondary | ICD-10-CM

## 2014-02-08 MED ORDER — LISINOPRIL 10 MG PO TABS: 10.0000 mg | ORAL_TABLET | Freq: Every day | ORAL | Status: DC

## 2014-02-08 NOTE — Progress Notes (Signed)
Clinical Summary [name] is a 63 y.o.male last seen by Dr Antoine Poche, this is our first visit together. He is seen for the following medical problems.  1. CAD - hx of CABG in 2001, at [facility].  - GXT 09/2012 with no ischemic changes, did have frequent PVCs, 3 beat run of NSVT - echo 01/2013 LVEF 50%, grade I diastoilc dysfunction.   - denies any chest pain. Walks regularly, denies any SOB or DOE. No LE edema, no orthopnea - compliant with meds:   2. Hyperlipidemia - prior side effects on other statins, significant muscle.  - compliant with current statin, tolerating well  3, HTN - checks sometimes at home. Typically around 100s/60s - compliant with meds  Past Medical History  Diagnosis Date  . CAD in native artery   . Unspecified essential hypertension   . Hyperlipidemia, mixed   . Erectile dysfunction   . Myocardial infarction     Out of hospital myocardial infarction/inferior wall, status post Cardiolite study 2007.  Ejection fraction 45%     Allergies  Allergen Reactions  . Acetaminophen     REACTION: hallucinations     Current Outpatient Prescriptions  Medication Sig Dispense Refill  . aspirin 81 MG tablet Take 81 mg by mouth daily.        Marland Kitchen atorvastatin (LIPITOR) 40 MG tablet Take 1 tablet (40 mg total) by mouth daily.  30 tablet  1  . CIALIS 10 MG tablet Take 1 tablet by mouth Once daily as needed.      Marland Kitchen lisinopril (PRINIVIL,ZESTRIL) 10 MG tablet Take 1 tablet (10 mg total) by mouth daily.  30 tablet  1  . metoprolol succinate (TOPROL-XL) 50 MG 24 hr tablet Take 1 & 1/2 by mouth daily  135 tablet  3  . Multiple Vitamin (MULTIVITAMIN) tablet Take 1 tablet by mouth daily. Doesn't take this everyday      . nitroGLYCERIN (NITROSTAT) 0.4 MG SL tablet Place 0.4 mg under the tongue every 5 (five) minutes as needed.        No current facility-administered medications for this visit.     Past Surgical History  Procedure Laterality Date  . Coronary artery  bypass graft      2001.  LIMA to LAD, SVG to OM1, SVG to OM 2, SVG to diagonal, SVG to PDA.  . Cataract extraction    . Laparoscopic cholecystectomy  06/22/2013    Dr. Cristy Folks     Allergies  Allergen Reactions  . Acetaminophen     REACTION: hallucinations      No family history on file.   Social History Mr. Lodge reports that he has never smoked. He has never used smokeless tobacco. Mr. Bowdish has no alcohol history on file.   Review of Systems CONSTITUTIONAL: No weight loss, fever, chills, weakness or fatigue.  HEENT: Eyes: No visual loss, blurred vision, double vision or yellow sclerae.No hearing loss, sneezing, congestion, runny nose or sore throat.  SKIN: No rash or itching.  CARDIOVASCULAR: per HPI RESPIRATORY: No shortness of breath, cough or sputum.  GASTROINTESTINAL: No anorexia, nausea, vomiting or diarrhea. No abdominal pain or blood.  GENITOURINARY: No burning on urination, no polyuria NEUROLOGICAL: No headache, dizziness, syncope, paralysis, ataxia, numbness or tingling in the extremities. No change in bowel or bladder control.  MUSCULOSKELETAL: No muscle, back pain, joint pain or stiffness.  LYMPHATICS: No enlarged nodes. No history of splenectomy.  PSYCHIATRIC: No history of depression or anxiety.  ENDOCRINOLOGIC: No  reports of sweating, cold or heat intolerance. No polyuria or polydipsia.  Marland Kitchen.   Physical Examination p 65 bp 119/75 Wt 217 lbs BMI 28 Gen: resting comfortably, no acute distress HEENT: no scleral icterus, pupils equal round and reactive, no palptable cervical adenopathy,  CV: RRR, no m/r/g, no JVD, no carotid bruits Resp: Clear to auscultation bilaterally GI: abdomen is soft, non-tender, non-distended, normal bowel sounds, no hepatosplenomegaly MSK: extremities are warm, no edema.  Skin: warm, no rash Neuro:  no focal deficits Psych: appropriate affect   Diagnostic Studies 01/2013 Echo Study Conclusions  - Left ventricle: Systolic  function is low normal, with an estimated EF of 50-55%, but closer to 50%. There is mild global hypokinesis. The cavity size was normal. Wall thickness was increased in a pattern of mild LVH. There was an increased relative contribution of atrial contraction to ventricular filling. Doppler parameters are consistent with abnormal left ventricular relaxation (grade 1 diastolic dysfunction). - Ventricular septum: Motion consistent with post-operative changes. The contour showed diastolic flattening. - Left atrium: The atrium was mildly dilated.    09/2012 ETT Exc 6 min 50 sec, 10.1 METs. No specific ischemic changes, frequent PVCs and PACs, 3 beat run of NSVT.   Assessment and Plan   1. CAD - no current symptoms, continue risk factor modification and secondary prevention  2. Hyperlipidemia - side effects on high dose statin, continue lipitor 40mg  daily - request most recent lipid panel from pcp  3. HTN - at goal, continue current meds     Antoine PocheJonathan F. Schyler Counsell, M.D.

## 2014-02-08 NOTE — Patient Instructions (Signed)
There were no changes to your medications. Continue as directed. Your physician wants you to follow up in:  1 year.  You will receive a reminder letter in the mail one-two months in advance.  If you don't receive a letter, please call our office to schedule the follow up appointment. 

## 2014-09-05 ENCOUNTER — Ambulatory Visit (INDEPENDENT_AMBULATORY_CARE_PROVIDER_SITE_OTHER): Payer: 59 | Admitting: Cardiology

## 2014-09-05 ENCOUNTER — Encounter: Payer: Self-pay | Admitting: Cardiology

## 2014-09-05 VITALS — BP 144/90 | HR 69 | Ht 74.0 in | Wt 221.0 lb

## 2014-09-05 DIAGNOSIS — I10 Essential (primary) hypertension: Secondary | ICD-10-CM | POA: Diagnosis not present

## 2014-09-05 MED ORDER — ATORVASTATIN CALCIUM 80 MG PO TABS: 80.0000 mg | ORAL_TABLET | Freq: Every day | ORAL | Status: DC

## 2014-09-05 NOTE — Patient Instructions (Signed)
Your physician wants you to follow-up in: 6 MONTHS WITH DR. BRANCH You will receive a reminder letter in the mail two months in advance. If you don't receive a letter, please call our office to schedule the follow-up appointment.  Your physician has recommended you make the following change in your medication:   INCREASE ATORVASTATIN 80 MG DAILY  CONTINUE ALL OTHER MEDICATIONS AS DIRECTED  Thank you for choosing [facility]!!

## 2014-09-05 NOTE — Progress Notes (Signed)
Clinical Summary [name] is a 64 y.o.male seen today for follow up of the following medical problems.   1. CAD - [facility] in 2001, [facility].  - GXT 09/2012 with no ischemic changes, did have frequent PVCs, 3 beat run of NSVT - echo 01/2013 LVEF 50%, grade I diastoilc dysfunction.   - recently has had some episodes of chest pain - left sided pain to left shoulder blade, sharp pain7/10. No other associated symptoms. Can occur at rest or with exertion, but most often after eating Can last 1-20 minutes. Not positional. No SOB or DOE, remains very physically active. Since starting zantac significant decrease in frequency. Heavy burping is often associated with symptoms and helps relieve pain.   2. Hyperlipidemia - compliant with statin - no recent panel in our system, reports recent blood work at his job  3, HTN - compliant with meds Past Medical History  Diagnosis Date  . CAD in native artery   . Unspecified essential hypertension   . Hyperlipidemia, mixed   . Erectile dysfunction   . Myocardial infarction     Out of hospital myocardial infarction/inferior wall, status post Cardiolite study 2007.  Ejection fraction 45%     Allergies  Allergen Reactions  . Acetaminophen     REACTION: hallucinations     Current Outpatient Prescriptions  Medication Sig Dispense Refill  . aspirin 81 MG tablet Take 81 mg by mouth daily.      Marland Kitchen. atorvastatin (LIPITOR) 40 MG tablet Take 1 tablet (40 mg total) by mouth daily. 30 tablet 1  . CIALIS 10 MG tablet Take 1 tablet by mouth Once daily as needed.    Marland Kitchen. lisinopril (PRINIVIL,ZESTRIL) 10 MG tablet Take 1 tablet (10 mg total) by mouth daily. 90 tablet 3  . metoprolol succinate (TOPROL-XL) 50 MG 24 hr tablet Take 1 & 1/2 by mouth daily 135 tablet 3  . Multiple Vitamin (MULTIVITAMIN) tablet Take 1 tablet by mouth daily. Doesn't take this everyday    . nitroGLYCERIN (NITROSTAT) 0.4 MG SL tablet Place 0.4 mg under the tongue every 5  (five) minutes as needed.      No current facility-administered medications for this visit.     Past Surgical History  Procedure Laterality Date  . Coronary artery bypass graft      2001.  LIMA to LAD, SVG to OM1, SVG to OM 2, SVG to diagonal, SVG to PDA.  . Cataract extraction    . Laparoscopic cholecystectomy  06/22/2013    Dr. Cristy FolksBeacham     Allergies  Allergen Reactions  . Acetaminophen     REACTION: hallucinations      No family history on file.   Social History Michael Melton reports that he has never smoked. He has never used smokeless tobacco. [name] has no alcohol history on file.   Review of Systems CONSTITUTIONAL: No weight loss, fever, chills, weakness or fatigue.  HEENT: Eyes: No visual loss, blurred vision, double vision or yellow sclerae.No hearing loss, sneezing, congestion, runny nose or sore throat.  SKIN: No rash or itching.  CARDIOVASCULAR: per HPI RESPIRATORY: No shortness of breath, cough or sputum.  GASTROINTESTINAL: No anorexia, nausea, vomiting or diarrhea. No abdominal pain or blood.  GENITOURINARY: No burning on urination, no polyuria NEUROLOGICAL: No headache, dizziness, syncope, paralysis, ataxia, numbness or tingling in the extremities. No change in bowel or bladder control.  MUSCULOSKELETAL: No muscle, back pain, joint pain or stiffness.  LYMPHATICS: No enlarged  nodes. No history of splenectomy.  PSYCHIATRIC: No history of depression or anxiety.  ENDOCRINOLOGIC: No reports of sweating, cold or heat intolerance. No polyuria or polydipsia.  Marland Kitchen   Physical Examination p 69 bp 140/70 Wt 221 lbs BMI 28 Gen: resting comfortably, no acute distress HEENT: no scleral icterus, pupils equal round and reactive, no palptable cervical adenopathy,  CV: RRR, no m/r/g, no JVD, no carotid bruits Resp: Clear to auscultation bilaterally GI: abdomen is soft, non-tender, non-distended, normal bowel sounds, no hepatosplenomegaly MSK: extremities are warm, no  edema.  Skin: warm, no rash Neuro:  no focal deficits Psych: appropriate affect   Diagnostic Studies  01/2013 Echo Study Conclusions  - Left ventricle: Systolic function is low normal, with an estimated EF of 50-55%, but closer to 50%. There is mild global hypokinesis. The cavity size was normal. Wall thickness was increased in a pattern of mild LVH. There was an increased relative contribution of atrial contraction to ventricular filling. Doppler parameters are consistent with abnormal left ventricular relaxation (grade 1 diastolic dysfunction). - Ventricular septum: Motion consistent with post-operative changes. The contour showed diastolic flattening. - Left atrium: The atrium was mildly dilated.    09/2012 ETT Exc 6 min 50 sec, 10.1 METs. No specific ischemic changes, frequent PVCs and PACs, 3 beat run of NSVT.    Assessment and Plan   1. CAD - current symptoms very classic for GERD. Continue zantac as it is relieving his symptoms. No indication for stress testing at this time.  - EKG reviewed in clinic with chronic lateral ST/T changes, no new ischemic changes  2. Hyperlipidemia - will try to increase atorva to  daily due to his hx of CAD - he will drop off his labs from work  3. HTN - at goal, continue current meds   F/u 6 months  Antoine Poche, M.D.

## 2015-04-10 ENCOUNTER — Encounter: Payer: Self-pay | Admitting: Cardiology

## 2015-04-10 ENCOUNTER — Encounter (INDEPENDENT_AMBULATORY_CARE_PROVIDER_SITE_OTHER): Payer: 59 | Admitting: Cardiology

## 2015-04-10 ENCOUNTER — Encounter: Payer: Self-pay | Admitting: *Deleted

## 2015-04-10 NOTE — Patient Instructions (Signed)

## 2015-04-10 NOTE — Progress Notes (Deleted)
Patient ID: Michael Melton, male   DOB: December 28, 1950, 64 y.o.   MRN: [id number]     Clinical Summary Michael Melton is a 64 y.o.male seen today for follow up of the following medical problems.   1. CAD - hx of CABG in 2001, at [facility].  - GXT 09/2012 with no ischemic changes, did have frequent PVCs, 3 beat run of NSVT - echo 01/2013 LVEF 50%, grade I diastoilc dysfunction.   - recently has had some episodes of chest pain - left sided pain to left shoulder blade, sharp pain7/10. No other associated symptoms. Can occur at rest or with exertion, but most often after eating Can last 1-20 minutes. Not positional. No SOB or DOE, remains very physically active. Since starting zantac significant decrease in frequency. Heavy burping is often associated with symptoms and helps relieve pain.   - denies any significant chest pain. Not as much associated with eating. Sharp pain through left chest, 6/10. Occurred with activity. No other associated symptoms. Lasted up 10 minutes. Not positional. 3 episodes over the last 3 months, last episode 3-4 weeks ago. Takes zantac just prn.    2. Hyperlipidemia - compliant with statin - no recent panel in our system, reports recent blood work at his job  3, HTN - compliant with meds  SH: works at [employer]   Past Medical History  Diagnosis Date  . CAD in native artery   . Unspecified essential hypertension   . Hyperlipidemia, mixed   . Erectile dysfunction   . Myocardial infarction [facility])     Out of hospital myocardial infarction/inferior wall, status post Cardiolite study 2007.  Ejection fraction 45%     Allergies  Allergen Reactions  . Acetaminophen     REACTION: hallucinations     Current Outpatient Prescriptions  Medication Sig Dispense Refill  . aspirin 81 MG tablet Take 81 mg by mouth daily.      Marland Kitchen atorvastatin (LIPITOR) 80 MG tablet Take 1 tablet (80 mg total) by mouth daily. 90 tablet 3  . CIALIS 10 MG tablet Take 1 tablet by mouth Once  daily as needed.    Marland Kitchen lisinopril (PRINIVIL,ZESTRIL) 10 MG tablet Take 1 tablet (10 mg total) by mouth daily. 90 tablet 3  . metoprolol succinate (TOPROL-XL) 50 MG 24 hr tablet Take 1 & 1/2 by mouth daily 135 tablet 3  . Multiple Vitamin (MULTIVITAMIN) tablet Take 1 tablet by mouth daily. Doesn't take this everyday    . nitroGLYCERIN (NITROSTAT) 0.4 MG SL tablet Place 0.4 mg under the tongue every 5 (five) minutes as needed.     . Ranitidine HCl (ZANTAC PO) Take by mouth as needed.     No current facility-administered medications for this visit.     Past Surgical History  Procedure Laterality Date  . Coronary artery bypass graft      2001.  LIMA to LAD, SVG to OM1, SVG to OM 2, SVG to diagonal, SVG to PDA.  . Cataract extraction    . Laparoscopic cholecystectomy  06/22/2013    Dr. Cristy Folks     Allergies  Allergen Reactions  . Acetaminophen     REACTION: hallucinations      No family history on file.   Social History Michael Melton reports that he has never smoked.  He has never used smokeless tobacco. Michael Melton has no alcohol history on file.   Review of Systems CONSTITUTIONAL: No weight loss, fever, chills, weakness or fatigue.  HEENT: Eyes: No visual loss,  blurred vision, double vision or yellow sclerae.No hearing loss, sneezing, congestion, runny nose or sore throat.  SKIN: No rash or itching.  CARDIOVASCULAR:  RESPIRATORY: No shortness of breath, cough or sputum.  GASTROINTESTINAL: No anorexia, nausea, vomiting or diarrhea. No abdominal pain or blood.  GENITOURINARY: No burning on urination, no polyuria NEUROLOGICAL: No headache, dizziness, syncope, paralysis, ataxia, numbness or tingling in the extremities. No change in bowel or bladder control.  MUSCULOSKELETAL: No muscle, back pain, joint pain or stiffness.  LYMPHATICS: No enlarged nodes. No history of splenectomy.  PSYCHIATRIC: No history of depression or anxiety.  ENDOCRINOLOGIC: No reports of sweating, cold or  heat intolerance. No polyuria or polydipsia.  Marland Kitchen.   Physical Examination There were no vitals filed for this visit. There were no vitals filed for this visit.  Gen: resting comfortably, no acute distress HEENT: no scleral icterus, pupils equal round and reactive, no palptable cervical adenopathy,  CV Resp: Clear to auscultation bilaterally GI: abdomen is soft, non-tender, non-distended, normal bowel sounds, no hepatosplenomegaly MSK: extremities are warm, no edema.  Skin: warm, no rash Neuro:  no focal deficits Psych: appropriate affect   Diagnostic Studies  01/2013 Echo Study Conclusions  - Left ventricle: Systolic function is low normal, with an estimated EF of 50-55%, but closer to 50%. There is mild global hypokinesis. The cavity size was normal. Wall thickness was increased in a pattern of mild LVH. There was an increased relative contribution of atrial contraction to ventricular filling. Doppler parameters are consistent with abnormal left ventricular relaxation (grade 1 diastolic dysfunction). - Ventricular septum: Motion consistent with post-operative changes. The contour showed diastolic flattening. - Left atrium: The atrium was mildly dilated.    09/2012 ETT Exc 6 min 50 sec, 10.1 METs. No specific ischemic changes, frequent PVCs and PACs, 3 beat run of NSVT.    Assessment and Plan   1. CAD - current symptoms very classic for GERD. Continue zantac as it is relieving his symptoms. No indication for stress testing at this time.  - EKG reviewed in clinic with chronic lateral ST/T changes, no new ischemic changes  2. Hyperlipidemia - will try to increase atorva to 80mg  daily due to his hx of CAD - he will drop off his labs from work  3. HTN - at goal, continue current meds     Antoine PocheJonathan F. Trula Frede, M.D., F.A.C.C.

## 2015-04-11 NOTE — Progress Notes (Signed)
ERROR No Show  

## 2015-04-19 ENCOUNTER — Other Ambulatory Visit: Payer: Self-pay | Admitting: Cardiology

## 2015-04-19 ENCOUNTER — Other Ambulatory Visit: Payer: Self-pay | Admitting: *Deleted

## 2015-10-18 ENCOUNTER — Encounter: Payer: Self-pay | Admitting: *Deleted

## 2015-10-20 ENCOUNTER — Ambulatory Visit (INDEPENDENT_AMBULATORY_CARE_PROVIDER_SITE_OTHER): Payer: 59 | Admitting: Cardiology

## 2015-10-20 VITALS — BP 156/81 | HR 80 | Ht 74.0 in | Wt 216.0 lb

## 2015-10-20 DIAGNOSIS — E785 Hyperlipidemia, unspecified: Secondary | ICD-10-CM | POA: Diagnosis not present

## 2015-10-20 DIAGNOSIS — I10 Essential (primary) hypertension: Secondary | ICD-10-CM

## 2015-10-20 DIAGNOSIS — I2510 Atherosclerotic heart disease of native coronary artery without angina pectoris: Secondary | ICD-10-CM

## 2015-10-20 NOTE — Progress Notes (Signed)
Clinical Summary Michael Melton is a 65 y.o.male seen today for follow up of the following medical problems.   1. CAD - hx of CABG in 2001, at [facility].  - GXT 09/2012 with no ischemic changes, did have frequent PVCs, 3 beat run of NSVT - echo 01/2013 LVEF 50%, grade I diastoilc dysfunction.   - rare symptoms since last visit. Overall previous chest pain significantly improved after starting zantac.  - no SOB or DOE - compliant with meds   2. Hyperlipidemia - compliant with statin - 09/2015 Labs from work show  TC 151 HDL 40 TG 81 LDL 94  3, HTN - compliant with meds - bp's at home 120-130s/60s     SH: heading [place] next [place].     Past Medical History  Diagnosis Date  . CAD in native artery   . Unspecified essential hypertension   . Hyperlipidemia, mixed   . Erectile dysfunction   . Myocardial infarction [facility])     Out of hospital myocardial infarction/inferior wall, status post Cardiolite study 2007.  Ejection fraction 45%     Allergies  Allergen Reactions  . Acetaminophen     REACTION: hallucinations     Current Outpatient Prescriptions  Medication Sig Dispense Refill  . aspirin 81 MG tablet Take 81 mg by mouth daily.      Marland Kitchen atorvastatin (LIPITOR) 80 MG tablet Take 1 tablet (80 mg total) by mouth daily. 90 tablet 3  . CIALIS 10 MG tablet Take 1 tablet by mouth Once daily as needed.    Marland Kitchen lisinopril (PRINIVIL,ZESTRIL) 10 MG tablet TAKE 1 TABLET BY MOUTH EVERY DAY 90 tablet 3  . metoprolol succinate (TOPROL-XL) 50 MG 24 hr tablet Take 1 & 1/2 by mouth daily 135 tablet 3  . Multiple Vitamin (MULTIVITAMIN) tablet Take 1 tablet by mouth daily. Doesn't take this everyday    . nitroGLYCERIN (NITROSTAT) 0.4 MG SL tablet Place 0.4 mg under the tongue every 5 (five) minutes as needed.     . Ranitidine HCl (ZANTAC PO) Take by mouth as needed.     No current facility-administered medications for this visit.     Past Surgical History  Procedure  Laterality Date  . Coronary artery bypass graft      2001.  LIMA to LAD, SVG to OM1, SVG to OM 2, SVG to diagonal, SVG to PDA.  . Cataract extraction    . Laparoscopic cholecystectomy  06/22/2013    Dr. Cristy Folks     Allergies  Allergen Reactions  . Acetaminophen     REACTION: hallucinations      No family history on file.   Social History Michael Melton reports that he has never smoked. He has never used smokeless tobacco. Michael Melton has no alcohol history on file.   Review of Systems CONSTITUTIONAL: No weight loss, fever, chills, weakness or fatigue.  HEENT: Eyes: No visual loss, blurred vision, double vision or yellow sclerae.No hearing loss, sneezing, congestion, runny nose or sore throat.  SKIN: No rash or itching.  CARDIOVASCULAR: per HPI RESPIRATORY: No shortness of breath, cough or sputum.  GASTROINTESTINAL: No anorexia, nausea, vomiting or diarrhea. No abdominal pain or blood.  GENITOURINARY: No burning on urination, no polyuria NEUROLOGICAL: No headache, dizziness, syncope, paralysis, ataxia, numbness or tingling in the extremities. No change in bowel or bladder control.  MUSCULOSKELETAL: No muscle, back pain, joint pain or stiffness.  LYMPHATICS: No enlarged nodes. No history of splenectomy.  PSYCHIATRIC: No history  of depression or anxiety.  ENDOCRINOLOGIC: No reports of sweating, cold or heat intolerance. No polyuria or polydipsia.  Marland Kitchen.   Physical Examination Filed Vitals:   10/20/15 1440  BP: 156/81  Pulse: 80   Filed Vitals:   10/20/15 1440  Height: 6\' 2"  (1.88 m)  Weight: 216 lb (97.977 kg)    Gen: resting comfortably, no acute distress HEENT: no scleral icterus, pupils equal round and reactive, no palptable cervical adenopathy,  CV Resp: Clear to auscultation bilaterally GI: abdomen is soft, non-tender, non-distended, normal bowel sounds, no hepatosplenomegaly MSK: extremities are warm, no edema.  Skin: warm, no rash Neuro:  no focal  deficits Psych: appropriate affect   Diagnostic Studies  01/2013 Echo Study Conclusions  - Left ventricle: Systolic function is low normal, with an estimated EF of 50-55%, but closer to 50%. There is mild global hypokinesis. The cavity size was normal. Wall thickness was increased in a pattern of mild LVH. There was an increased relative contribution of atrial contraction to ventricular filling. Doppler parameters are consistent with abnormal left ventricular relaxation (grade 1 diastolic dysfunction). - Ventricular septum: Motion consistent with post-operative changes. The contour showed diastolic flattening. - Left atrium: The atrium was mildly dilated.    09/2012 ETT Exc 6 min 50 sec, 10.1 METs. No specific ischemic changes, frequent PVCs and PACs, 3 beat run of NSVT.    Assessment and Plan   1. CAD - previous chest pain symptoms significantly improved with zantac - EKG in clinic shows SR with chronic nonspecific ST/T changes - we will continue current meds  2. Hyperlipidemia - at goal, continue current statin  3. HTN - at goal, we will continue current meds   F/u 1 year     Michael Melton, M.D.

## 2015-10-20 NOTE — Patient Instructions (Signed)
Medication Instructions:  Continue all current medications.  Labwork: NONE  Testing/Procedures: NONE  Follow-Up: Your physician wants you to follow up in:  1 year.  You will receive a reminder letter in the mail one-two months in advance.  If you don't receive a letter, please call our office to schedule the follow up appointment    If you need a refill on your cardiac medications before your next appointment, please call your pharmacy.  

## 2015-10-22 ENCOUNTER — Encounter: Payer: Self-pay | Admitting: Cardiology

## 2015-10-23 ENCOUNTER — Encounter: Payer: Self-pay | Admitting: *Deleted

## 2015-10-23 ENCOUNTER — Other Ambulatory Visit: Payer: Self-pay | Admitting: *Deleted

## 2015-10-23 MED ORDER — ATORVASTATIN CALCIUM 80 MG PO TABS: 80.0000 mg | ORAL_TABLET | Freq: Every day | ORAL | Status: DC

## 2016-06-04 ENCOUNTER — Ambulatory Visit (INDEPENDENT_AMBULATORY_CARE_PROVIDER_SITE_OTHER): Payer: 59 | Admitting: Cardiology

## 2016-06-04 ENCOUNTER — Encounter: Payer: Self-pay | Admitting: Cardiology

## 2016-06-04 ENCOUNTER — Encounter: Payer: Self-pay | Admitting: *Deleted

## 2016-06-04 VITALS — BP 136/78 | HR 56 | Ht 74.0 in | Wt 217.2 lb

## 2016-06-04 DIAGNOSIS — I10 Essential (primary) hypertension: Secondary | ICD-10-CM

## 2016-06-04 DIAGNOSIS — R079 Chest pain, unspecified: Secondary | ICD-10-CM | POA: Diagnosis not present

## 2016-06-04 DIAGNOSIS — I2510 Atherosclerotic heart disease of native coronary artery without angina pectoris: Secondary | ICD-10-CM

## 2016-06-04 DIAGNOSIS — E782 Mixed hyperlipidemia: Secondary | ICD-10-CM

## 2016-06-04 NOTE — Patient Instructions (Signed)
Your physician recommends that you schedule a follow-up appointment in: TO BE DETERMINED AFTER TESTING   Your physician recommends that you continue on your current medications as directed. Please refer to the Current Medication list given to you today.  Your physician has requested that you have en exercise stress myoview. For further information please visit [email or web address]. Please follow instruction sheet, as given.  Thank you for choosing [facility]!!    

## 2016-06-04 NOTE — Progress Notes (Signed)
Clinical Summary [name] is a 66 y.o.male seen today for follow up of the following medical problems.   1. CAD - hx of CABG in 2001, at [facility].  - GXT 09/2012 with no ischemic changes, did have frequent PVCs, 3 beat run of NSVT - echo 01/2013 LVEF 50%, grade I diastoilc dysfunction.   - recent chest pain symptoms started about 2 weeks ago. Across entire chest into left side, burning like pain. Can be severe. Tends to occur with exertion, resolves with rest. Not a constant pain. No SOB, no N/V/D, no diaphoresis. Longest is a few minutes - 2 episodes during major snow while using snow blower. Can occur with other forms of exertion.  - compliant with meds  2. Hyperlipidemia -he remains compliant with statin - 09/2015 Labs from work show  TC 151 HDL 40 TG 81 LDL 94  3, HTN - compliant with meds - bp's at home stable around 120-130s/60s        Past Medical History:  Diagnosis Date  . CAD in native artery   . Erectile dysfunction   . Hyperlipidemia, mixed   . Myocardial infarction    Out of hospital myocardial infarction/inferior wall, status post Cardiolite study 2007.  Ejection fraction 45%  . Unspecified essential hypertension      Allergies  Allergen Reactions  . Acetaminophen     REACTION: hallucinations     Current Outpatient Prescriptions  Medication Sig Dispense Refill  . aspirin 81 MG tablet Take 81 mg by mouth daily.      Marland Kitchen. atorvastatin (LIPITOR) 80 MG tablet Take 1 tablet (80 mg total) by mouth daily. 90 tablet 3  . CIALIS 10 MG tablet Take 1 tablet by mouth Once daily as needed.    Marland Kitchen. lisinopril (PRINIVIL,ZESTRIL) 10 MG tablet TAKE 1 TABLET BY MOUTH EVERY DAY 90 tablet 3  . metoprolol succinate (TOPROL-XL) 50 MG 24 hr tablet Take 1 & 1/2 by mouth daily 135 tablet 3  . Multiple Vitamin (MULTIVITAMIN) tablet Take 1 tablet by mouth daily. Doesn't take this everyday    . nitroGLYCERIN (NITROSTAT) 0.4 MG SL tablet Place 0.4 mg under the tongue  every 5 (five) minutes as needed.     . Ranitidine HCl (ZANTAC PO) Take by mouth as needed.     No current facility-administered medications for this visit.      Past Surgical History:  Procedure Laterality Date  . CATARACT EXTRACTION    . CORONARY ARTERY BYPASS GRAFT     2001.  LIMA to LAD, SVG to OM1, SVG to OM 2, SVG to diagonal, SVG to PDA.  Marland Kitchen. LAPAROSCOPIC CHOLECYSTECTOMY  06/22/2013   Dr. Cristy FolksBeacham     Allergies  Allergen Reactions  . Acetaminophen     REACTION: hallucinations      No family history on file.   Social History Michael Melton reports that he has never smoked. He has never used smokeless tobacco. [name] has no alcohol history on file.   Review of Systems CONSTITUTIONAL: No weight loss, fever, chills, weakness or fatigue.  HEENT: Eyes: No visual loss, blurred vision, double vision or yellow sclerae.No hearing loss, sneezing, congestion, runny nose or sore throat.  SKIN: No rash or itching.  CARDIOVASCULAR: per HPI RESPIRATORY: No shortness of breath, cough or sputum.  GASTROINTESTINAL: No anorexia, nausea, vomiting or diarrhea. No abdominal pain or blood.  GENITOURINARY: No burning on urination, no polyuria NEUROLOGICAL: No headache, dizziness, syncope, paralysis, ataxia, numbness or tingling  in the extremities. No change in bowel or bladder control.  MUSCULOSKELETAL: No muscle, back pain, joint pain or stiffness.  LYMPHATICS: No enlarged nodes. No history of splenectomy.  PSYCHIATRIC: No history of depression or anxiety.  ENDOCRINOLOGIC: No reports of sweating, cold or heat intolerance. No polyuria or polydipsia.  Marland Kitchen   Physical Examination Vitals:   06/04/16 1010  BP: 136/78  Pulse: (!) 56   Vitals:   06/04/16 1010  Weight: 217 lb 3.2 oz (98.5 kg)  Height: 6\' 2"  (1.88 m)    Gen: resting comfortably, no acute distress HEENT: no scleral icterus, pupils equal round and reactive, no palptable cervical adenopathy,  CV: RRR, no m/r/g, no  jvd Resp: Clear to auscultation bilaterally GI: abdomen is soft, non-tender, non-distended, normal bowel sounds, no hepatosplenomegaly MSK: extremities are warm, no edema.  Skin: warm, no rash Neuro:  no focal deficits Psych: appropriate affect   Diagnostic Studies 01/2013 Echo Study Conclusions  - Left ventricle: Systolic function is low normal, with an estimated EF of 50-55%, but closer to 50%. There is mild global hypokinesis. The cavity size was normal. Wall thickness was increased in a pattern of mild LVH. There was an increased relative contribution of atrial contraction to ventricular filling. Doppler parameters are consistent with abnormal left ventricular relaxation (grade 1 diastolic dysfunction). - Ventricular septum: Motion consistent with post-operative changes. The contour showed diastolic flattening. - Left atrium: The atrium was mildly dilated.    09/2012 ETT Exc 6 min 50 sec, 10.1 METs. No specific ischemic changes, frequent PVCs and PACs, 3 beat run of NSVT.     Assessment and Plan  1. CAD - recent chest pain symptoms. EKG in slinic SR, no acute ischemic changes - we will obtain an exericse nuclear stress test to further evaluate  2. Hyperlipidemia - lipids at goal, continue current statin  3. HTN - he has been at goal,continue current meds   F/u pending stress results      Antoine Poche, M.D.

## 2016-06-14 ENCOUNTER — Encounter (HOSPITAL_COMMUNITY): Payer: Self-pay

## 2016-06-14 ENCOUNTER — Encounter (HOSPITAL_COMMUNITY): Admission: RE | Admit: 2016-06-14 | Payer: 59 | Source: Ambulatory Visit

## 2016-06-14 ENCOUNTER — Encounter (HOSPITAL_COMMUNITY)
Admission: RE | Admit: 2016-06-14 | Discharge: 2016-06-14 | Disposition: A | Discharge status: Home / Self Care | Payer: 59 | Source: Ambulatory Visit | Attending: Cardiology | Admitting: Cardiology

## 2016-06-14 DIAGNOSIS — R079 Chest pain, unspecified: Secondary | ICD-10-CM | POA: Diagnosis present

## 2016-06-14 DIAGNOSIS — I510 Cardiac septal defect, acquired: Secondary | ICD-10-CM | POA: Insufficient documentation

## 2016-06-14 LAB — NM MYOCAR MULTI W/SPECT W/WALL MOTION / EF
CHL CUP NUCLEAR SDS: 3
CHL CUP NUCLEAR SRS: 9
CHL CUP NUCLEAR SSS: 12
CHL RATE OF PERCEIVED EXERTION: 15
CSEPED: 7 min
CSEPPHR: 150 {beats}/min
Estimated workload: 10.1 METS
Exercise duration (sec): 11 s
LHR: 0.41
LV dias vol: 133 mL (ref 62–150)
LVSYSVOL: 80 mL
MPHR: 157 {beats}/min
NUC STRESS TID: 1.04
Percent HR: 95 %
Rest HR: 66 {beats}/min

## 2016-06-14 MED ORDER — TECHNETIUM TC 99M TETROFOSMIN IV KIT
10.0000 | PACK | Freq: Once | INTRAVENOUS | Status: AC | PRN
  Administered 2016-06-14: 11 via INTRAVENOUS

## 2016-06-14 MED ORDER — TECHNETIUM TC 99M TETROFOSMIN IV KIT
30.0000 | PACK | Freq: Once | INTRAVENOUS | Status: AC | PRN
  Administered 2016-06-14: 32 via INTRAVENOUS

## 2016-06-14 MED ORDER — REGADENOSON 0.4 MG/5ML IV SOLN
INTRAVENOUS | Status: AC
  Filled 2016-06-14: qty 5

## 2016-06-14 MED ORDER — SODIUM CHLORIDE 0.9% FLUSH
INTRAVENOUS | Status: AC
  Filled 2016-06-14: qty 10

## 2016-06-18 ENCOUNTER — Telehealth: Payer: Self-pay | Admitting: *Deleted

## 2016-06-18 DIAGNOSIS — R9439 Abnormal result of other cardiovascular function study: Secondary | ICD-10-CM

## 2016-06-18 NOTE — Telephone Encounter (Signed)
-----   Message from Antoine PocheJonathan F Branch, MD sent at 06/14/2016  3:51 PM EST ----- Stress test shows a small area that suggests possibly a new small blockage, and also suggests that the pumping function of his heart may be mildly decreased. Has he had any more chest pain? Please order an echo for him. Based on echo and if any additional symptoms will help us decide where to go from here  Dominga FerryJ Branch MD

## 2016-06-18 NOTE — Telephone Encounter (Signed)
Pt aware and agreeable to echo - pt denies any chest pain since having stress test - will place orders and forward to schedulers

## 2016-07-10 ENCOUNTER — Ambulatory Visit (INDEPENDENT_AMBULATORY_CARE_PROVIDER_SITE_OTHER): Payer: 59

## 2016-07-10 ENCOUNTER — Other Ambulatory Visit: Payer: Self-pay

## 2016-07-10 DIAGNOSIS — R9439 Abnormal result of other cardiovascular function study: Secondary | ICD-10-CM | POA: Diagnosis not present

## 2016-07-12 ENCOUNTER — Telehealth: Payer: Self-pay | Admitting: Cardiology

## 2016-07-12 NOTE — Telephone Encounter (Signed)
Returned call

## 2016-07-12 NOTE — Telephone Encounter (Signed)
Echo does show some mild weakness to the pumping function of his heart. He should see me back in 1-2 weeks (can have a 940 or 340 slot) to discuss test results in detail and we can decide if additional testing or treatment is Corinna Capraneccesary   J BrancH MD   Pt aware and will come 3/16 Friday @ 9:40 [place] office for f/u appt

## 2016-07-18 ENCOUNTER — Encounter: Payer: Self-pay | Admitting: Cardiology

## 2016-07-18 ENCOUNTER — Encounter: Payer: Self-pay | Admitting: *Deleted

## 2016-07-18 ENCOUNTER — Ambulatory Visit (INDEPENDENT_AMBULATORY_CARE_PROVIDER_SITE_OTHER): Payer: 59 | Admitting: Cardiology

## 2016-07-18 ENCOUNTER — Telehealth: Payer: Self-pay | Admitting: Cardiology

## 2016-07-18 VITALS — BP 136/73 | HR 80 | Ht 74.0 in | Wt 223.2 lb

## 2016-07-18 DIAGNOSIS — I10 Essential (primary) hypertension: Secondary | ICD-10-CM | POA: Diagnosis not present

## 2016-07-18 DIAGNOSIS — I5022 Chronic systolic (congestive) heart failure: Secondary | ICD-10-CM

## 2016-07-18 DIAGNOSIS — I25119 Atherosclerotic heart disease of native coronary artery with unspecified angina pectoris: Secondary | ICD-10-CM | POA: Diagnosis not present

## 2016-07-18 DIAGNOSIS — E782 Mixed hyperlipidemia: Secondary | ICD-10-CM | POA: Diagnosis not present

## 2016-07-18 MED ORDER — METOPROLOL SUCCINATE ER 100 MG PO TB24: 100.0000 mg | ORAL_TABLET | Freq: Every day | ORAL | 1 refills | Status: DC

## 2016-07-18 NOTE — Telephone Encounter (Signed)
Pre-cert Verification for the following procedure   LHC 4/2 @ 8:30AM DR Eldridge DaceVARANASI

## 2016-07-18 NOTE — Progress Notes (Addendum)
   Clinical Summary Michael Melton is a 65 y.o.male seen today for follow up of the following medical problems.   1. CAD - hx of CABG in 2001, at [place].  - GXT 09/2012 with no ischemic changes, did have frequent PVCs, 3 beat run of NSVT - echo 01/2013 LVEF 50%, grade I diastoilc dysfunction.  - recent chest pain symptoms started about 2 weeks ago. Across entire chest into left side, burning like pain. Can be severe. Tends to occur with exertion, resolves with rest. Not a constant pain. No SOB, no N/V/D, no diaphoresis. Longest is a few minutes - 2 episodes during major snow while using snow blower. Can occur with other forms of exertion.  - compliant with meds  - echo 3/32018 LVEF 40%, grade I diastolic dysfunction - 06/2016 nuclear stress primarily scar with mild peri-infarct ischemia. LVEF 40%, intermediate risk.   - has had some chest pain. Throbbing pain midchest, can last 1min up to 30 minutes. Can occur with exertion at times. Can sometimes get better with antacid some times. Some times comes on after eating.   2. Hyperlipidemia -compliant with statin - 09/2015 lipid panel shows TC 151 HDL 40 TG 81 LDL 94  3, HTN - he is compliant with meds    Past Medical History:  Diagnosis Date  . CAD in native artery   . Erectile dysfunction   . Hyperlipidemia, mixed   . Myocardial infarction    Out of hospital myocardial infarction/inferior wall, status post Cardiolite study 2007.  Ejection fraction 45%  . Unspecified essential hypertension      Allergies  Allergen Reactions  . Acetaminophen     REACTION: hallucinations     Current Outpatient Prescriptions  Medication Sig Dispense Refill  . aspirin 81 MG tablet Take 81 mg by mouth daily.      . atorvastatin (LIPITOR) 80 MG tablet Take 1 tablet (80 mg total) by mouth daily. 90 tablet 3  . CIALIS 10 MG tablet Take 1 tablet by mouth Once daily as needed.    . lisinopril (PRINIVIL,ZESTRIL) 10 MG tablet TAKE 1 TABLET BY  MOUTH EVERY DAY 90 tablet 3  . metoprolol succinate (TOPROL-XL) 50 MG 24 hr tablet Take 1 & 1/2 by mouth daily 135 tablet 3  . Multiple Vitamin (MULTIVITAMIN) tablet Take 1 tablet by mouth daily. Doesn't take this everyday    . nitroGLYCERIN (NITROSTAT) 0.4 MG SL tablet Place 0.4 mg under the tongue every 5 (five) minutes as needed.     . Ranitidine HCl (ZANTAC PO) Take by mouth as needed.     No current facility-administered medications for this visit.      Past Surgical History:  Procedure Laterality Date  . CATARACT EXTRACTION    . CORONARY ARTERY BYPASS GRAFT     2001.  LIMA to LAD, SVG to OM1, SVG to OM 2, SVG to diagonal, SVG to PDA.  . LAPAROSCOPIC CHOLECYSTECTOMY  06/22/2013   Dr. Beacham     Allergies  Allergen Reactions  . Acetaminophen     REACTION: hallucinations      No family history on file.   Social History Michael Melton reports that he has never smoked. He has never used smokeless tobacco. Michael Melton has no alcohol history on file.   Review of Systems CONSTITUTIONAL: No weight loss, fever, chills, weakness or fatigue.  HEENT: Eyes: No visual loss, blurred vision, double vision or yellow sclerae.No hearing loss, sneezing, congestion, runny nose or sore throat.    SKIN: No rash or itching.  CARDIOVASCULAR: per hpi RESPIRATORY: No shortness of breath, cough or sputum.  GASTROINTESTINAL: No anorexia, nausea, vomiting or diarrhea. No abdominal pain or blood.  GENITOURINARY: No burning on urination, no polyuria NEUROLOGICAL: No headache, dizziness, syncope, paralysis, ataxia, numbness or tingling in the extremities. No change in bowel or bladder control.  MUSCULOSKELETAL: No muscle, back pain, joint pain or stiffness.  LYMPHATICS: No enlarged nodes. No history of splenectomy.  PSYCHIATRIC: No history of depression or anxiety.  ENDOCRINOLOGIC: No reports of sweating, cold or heat intolerance. No polyuria or polydipsia.  .   Physical Examination Vitals:    07/18/16 1534  BP: 136/73  Pulse: 80   Vitals:   07/18/16 1534  Weight: 223 lb 3.2 oz (101.2 kg)  Height: 6' 2" (1.88 m)    Gen: resting comfortably, no acute distress HEENT: no scleral icterus, pupils equal round and reactive, no palptable cervical adenopathy,  CV: RRR, no m/r/g, n ojvd Resp: Clear to auscultation bilaterally GI: abdomen is soft, non-tender, non-distended, normal bowel sounds, no hepatosplenomegaly MSK: extremities are warm, no edema.  Skin: warm, no rash Neuro:  no focal deficits Psych: appropriate affect   Diagnostic Studies 01/2013 Echo Study Conclusions  - Left ventricle: Systolic function is low normal, with an estimated EF of 50-55%, but closer to 50%. There is mild global hypokinesis. The cavity size was normal. Wall thickness was increased in a pattern of mild LVH. There was an increased relative contribution of atrial contraction to ventricular filling. Doppler parameters are consistent with abnormal left ventricular relaxation (grade 1 diastolic dysfunction). - Ventricular septum: Motion consistent with post-operative changes. The contour showed diastolic flattening. - Left atrium: The atrium was mildly dilated.   09/2012 ETT Exc 6 min 50 sec, 10.1 METs. No specific ischemic changes, frequent PVCs and PACs, 3 beat run of NSVT.   06/2016 Nuclear stress test  ECG nondiagnostic due to IVCD. No arrhythmias noted during exercise.  Blood pressure demonstrated a hypertensive response to exercise.  Medium, moderate intensity, inferior/inferolateral defect extended from apex to base. There is a partially reversible zone in the inferolateral wall at the base. Most suggestive of scar with mild peri-infarct ischemia.  This is an intermediate risk study.  Nuclear stress EF: 40%. Consider confirmation with echocardiography.   07/2016 echo Study Conclusions  - Left ventricle: The cavity size was at the upper limits of   normal. Wall thickness was  increased in a pattern of mild LVH.   The estimated ejection fraction was 40%. Diffuse hypokinesis.   There is akinesis of the basalinferior myocardium. Doppler   parameters are consistent with abnormal left ventricular   relaxation (grade 1 diastolic dysfunction). - Aortic valve: Mildly calcified annulus. Trileaflet. - Mitral valve: Mildly thickened leaflets . There was mild   regurgitation. - Left atrium: The atrium was mildly dilated. - Tricuspid valve: There was trivial regurgitation. - Pulmonary arteries: Systolic pressure could not be accurately   estimated. - Pericardium, extracardiac: There was no pericardial effusion.  Impressions:  - Mild LVH with upper normal chamber size and LVEF approximately   40%. There is diffuse hypokinesis with inferior basal akinesis.   Probable grade 1 diastolic dysfunction. Mild left atrial   enlargement. Mildly thickened mitral leaflets with mild mitral   regurgitation.  Assessment and Plan  1. CAD/Chronic systolic HF/ICM - recent chest pain symptoms. Nuclear stress with evidence of ischemia, echo shows decrease in LVEF - we will plan for LHC, patient has requested to   be completed first week of April.  - increase Toprol XL to 100mg daily in setting of decreased LVEF  2. Hyperlipidemia - his lipids have been at goal, continue  statin  3. HTN - at goal, continue current meds   F/u 1 month  I have reviewed the risks, indications, and alternatives to cardiac catheterization, possible angioplasty, and stenting with the patient . Risks include but are not limited to bleeding, infection, vascular injury, stroke, myocardial infection, arrhythmia, kidney injury, radiation-related injury in the case of prolonged fluoroscopy use, emergency cardiac surgery, and death. The patient understands the risks of serious complication is 1-2 in 1000 with diagnostic cardiac cath and 1-2% or less with angioplasty/stenting.     Loy Mccartt F. Hiroshi Krummel, M.D.,  F.A.C.C. 

## 2016-07-18 NOTE — Patient Instructions (Signed)
Your physician recommends that you schedule a follow-up appointment in: 1 MONTH WITH DR. BRANCH  Your physician has recommended you make the following change in your medication:   INCREASE TOPROL XL 100 MG DAILY   Your physician has requested that you have a cardiac catheterization. Cardiac catheterization is used to diagnose and/or treat various heart conditions. Doctors may recommend this procedure for a number of different reasons. The most common reason is to evaluate chest pain. Chest pain can be a symptom of coronary artery disease (CAD), and cardiac catheterization can show whether plaque is narrowing or blocking your heart's arteries. This procedure is also used to evaluate the valves, as well as measure the blood flow and oxygen levels in different parts of your heart. For further information please visit [email or web address] Please follow instruction sheet, as given.  Thank you for choosing [facility]!!

## 2016-07-19 ENCOUNTER — Ambulatory Visit: Payer: 59 | Admitting: Cardiology

## 2016-07-21 ENCOUNTER — Other Ambulatory Visit: Payer: Self-pay | Admitting: Cardiology

## 2016-07-21 DIAGNOSIS — R0789 Other chest pain: Secondary | ICD-10-CM

## 2016-07-26 LAB — PROTIME-INR

## 2016-07-30 ENCOUNTER — Telehealth: Payer: Self-pay | Admitting: *Deleted

## 2016-07-30 NOTE — Telephone Encounter (Signed)
-----   Message from Antoine PocheJonathan F Branch, MD sent at 07/30/2016 10:06 AM EDT ----- Labs look good  Dominga FerryJ Branch MD

## 2016-07-30 NOTE — Telephone Encounter (Signed)
Pt aware - routed to pcp  

## 2016-08-03 ENCOUNTER — Other Ambulatory Visit: Payer: Self-pay | Admitting: Cardiology

## 2016-08-05 ENCOUNTER — Ambulatory Visit (HOSPITAL_COMMUNITY)
Admission: RE | Admit: 2016-08-05 | Discharge: 2016-08-06 | Disposition: A | Discharge status: Home / Self Care | Payer: 59 | Source: Ambulatory Visit | Attending: Interventional Cardiology | Admitting: Interventional Cardiology

## 2016-08-05 ENCOUNTER — Encounter (HOSPITAL_COMMUNITY): Payer: Self-pay | Admitting: Interventional Cardiology

## 2016-08-05 ENCOUNTER — Encounter (HOSPITAL_COMMUNITY)
Admission: RE | Disposition: A | Discharge status: Home / Self Care | Payer: Self-pay | Source: Ambulatory Visit | Attending: Interventional Cardiology

## 2016-08-05 DIAGNOSIS — R9439 Abnormal result of other cardiovascular function study: Secondary | ICD-10-CM

## 2016-08-05 DIAGNOSIS — E782 Mixed hyperlipidemia: Secondary | ICD-10-CM | POA: Insufficient documentation

## 2016-08-05 DIAGNOSIS — I110 Hypertensive heart disease with heart failure: Secondary | ICD-10-CM | POA: Insufficient documentation

## 2016-08-05 DIAGNOSIS — I2582 Chronic total occlusion of coronary artery: Secondary | ICD-10-CM | POA: Insufficient documentation

## 2016-08-05 DIAGNOSIS — I25810 Atherosclerosis of coronary artery bypass graft(s) without angina pectoris: Secondary | ICD-10-CM | POA: Diagnosis not present

## 2016-08-05 DIAGNOSIS — I252 Old myocardial infarction: Secondary | ICD-10-CM | POA: Insufficient documentation

## 2016-08-05 DIAGNOSIS — R079 Chest pain, unspecified: Secondary | ICD-10-CM | POA: Diagnosis not present

## 2016-08-05 DIAGNOSIS — Z951 Presence of aortocoronary bypass graft: Secondary | ICD-10-CM

## 2016-08-05 DIAGNOSIS — Z955 Presence of coronary angioplasty implant and graft: Secondary | ICD-10-CM

## 2016-08-05 DIAGNOSIS — Z7982 Long term (current) use of aspirin: Secondary | ICD-10-CM | POA: Diagnosis not present

## 2016-08-05 DIAGNOSIS — I2510 Atherosclerotic heart disease of native coronary artery without angina pectoris: Secondary | ICD-10-CM

## 2016-08-05 DIAGNOSIS — I5042 Chronic combined systolic (congestive) and diastolic (congestive) heart failure: Secondary | ICD-10-CM | POA: Insufficient documentation

## 2016-08-05 DIAGNOSIS — R0789 Other chest pain: Secondary | ICD-10-CM

## 2016-08-05 HISTORY — PX: CORONARY STENT INTERVENTION: CATH118234

## 2016-08-05 HISTORY — PX: CORONARY BALLOON ANGIOPLASTY: CATH118233

## 2016-08-05 HISTORY — PX: LEFT HEART CATH AND CORS/GRAFTS ANGIOGRAPHY: CATH118250

## 2016-08-05 LAB — POCT ACTIVATED CLOTTING TIME: ACTIVATED CLOTTING TIME: 406 s

## 2016-08-05 SURGERY — LEFT HEART CATH AND CORS/GRAFTS ANGIOGRAPHY
Anesthesia: LOCAL

## 2016-08-05 MED ORDER — SODIUM CHLORIDE 0.9 % IV SOLN
INTRAVENOUS | Status: DC | PRN
  Administered 2016-08-05: 1.75 mg/kg/h via INTRAVENOUS

## 2016-08-05 MED ORDER — IOPAMIDOL (ISOVUE-370) INJECTION 76%
INTRAVENOUS | Status: AC
  Filled 2016-08-05: qty 100

## 2016-08-05 MED ORDER — LISINOPRIL 10 MG PO TABS
10.0000 mg | ORAL_TABLET | Freq: Every day | ORAL | Status: DC
  Administered 2016-08-06: 11:00:00 10 mg via ORAL
  Filled 2016-08-05: qty 1

## 2016-08-05 MED ORDER — SODIUM CHLORIDE 0.9% FLUSH: 3.0000 mL | Freq: Two times a day (BID) | INTRAVENOUS | Status: DC

## 2016-08-05 MED ORDER — SODIUM CHLORIDE 0.9% FLUSH: 3.0000 mL | INTRAVENOUS | Status: DC | PRN

## 2016-08-05 MED ORDER — LIDOCAINE HCL (PF) 1 % IJ SOLN
INTRAMUSCULAR | Status: AC
  Filled 2016-08-05: qty 30

## 2016-08-05 MED ORDER — LABETALOL HCL 5 MG/ML IV SOLN: 10.0000 mg | INTRAVENOUS | Status: AC | PRN

## 2016-08-05 MED ORDER — SODIUM CHLORIDE 0.9 % IV SOLN: INTRAVENOUS | Status: AC

## 2016-08-05 MED ORDER — SODIUM CHLORIDE 0.9 % WEIGHT BASED INFUSION
3.0000 mL/kg/h | INTRAVENOUS | Status: DC
  Administered 2016-08-05: 3 mL/kg/h via INTRAVENOUS

## 2016-08-05 MED ORDER — NITROGLYCERIN 0.4 MG SL SUBL: 0.4000 mg | SUBLINGUAL_TABLET | SUBLINGUAL | Status: DC | PRN

## 2016-08-05 MED ORDER — CLOPIDOGREL BISULFATE 300 MG PO TABS
ORAL_TABLET | ORAL | Status: AC
  Filled 2016-08-05: qty 2

## 2016-08-05 MED ORDER — NITROGLYCERIN 1 MG/10 ML FOR IR/CATH LAB
INTRA_ARTERIAL | Status: DC | PRN
  Administered 2016-08-05: 200 ug via INTRACORONARY

## 2016-08-05 MED ORDER — HEPARIN (PORCINE) IN NACL 2-0.9 UNIT/ML-% IJ SOLN
INTRAMUSCULAR | Status: DC | PRN
  Administered 2016-08-05: 1000 mL

## 2016-08-05 MED ORDER — CLOPIDOGREL BISULFATE 300 MG PO TABS
ORAL_TABLET | ORAL | Status: DC | PRN
  Administered 2016-08-05: 600 mg via ORAL

## 2016-08-05 MED ORDER — CLOPIDOGREL BISULFATE 75 MG PO TABS
75.0000 mg | ORAL_TABLET | Freq: Every day | ORAL | Status: DC
  Administered 2016-08-06: 10:00:00 75 mg via ORAL
  Filled 2016-08-05: qty 1

## 2016-08-05 MED ORDER — METOPROLOL SUCCINATE ER 25 MG PO TB24
100.0000 mg | ORAL_TABLET | Freq: Every day | ORAL | Status: DC
  Administered 2016-08-06: 100 mg via ORAL
  Filled 2016-08-05: qty 4

## 2016-08-05 MED ORDER — IOPAMIDOL (ISOVUE-370) INJECTION 76%
INTRAVENOUS | Status: DC | PRN
  Administered 2016-08-05: 145 mL via INTRA_ARTERIAL

## 2016-08-05 MED ORDER — MORPHINE SULFATE (PF) 4 MG/ML IV SOLN: 2.0000 mg | INTRAVENOUS | Status: DC | PRN

## 2016-08-05 MED ORDER — ATORVASTATIN CALCIUM 80 MG PO TABS
80.0000 mg | ORAL_TABLET | Freq: Every day | ORAL | Status: DC
  Administered 2016-08-06: 11:00:00 80 mg via ORAL
  Filled 2016-08-05: qty 1

## 2016-08-05 MED ORDER — ASPIRIN 81 MG PO CHEW: 81.0000 mg | CHEWABLE_TABLET | ORAL | Status: DC

## 2016-08-05 MED ORDER — MIDAZOLAM HCL 2 MG/2ML IJ SOLN
INTRAMUSCULAR | Status: AC
  Filled 2016-08-05: qty 2

## 2016-08-05 MED ORDER — LIDOCAINE HCL (PF) 1 % IJ SOLN
INTRAMUSCULAR | Status: DC | PRN
  Administered 2016-08-05: 20 mL

## 2016-08-05 MED ORDER — NITROGLYCERIN 1 MG/10 ML FOR IR/CATH LAB
INTRA_ARTERIAL | Status: AC
  Filled 2016-08-05: qty 10

## 2016-08-05 MED ORDER — SODIUM CHLORIDE 0.9 % IV SOLN: 250.0000 mL | INTRAVENOUS | Status: DC | PRN

## 2016-08-05 MED ORDER — IOPAMIDOL (ISOVUE-370) INJECTION 76%
INTRAVENOUS | Status: AC
  Filled 2016-08-05: qty 125

## 2016-08-05 MED ORDER — FAMOTIDINE 20 MG PO TABS
10.0000 mg | ORAL_TABLET | Freq: Every day | ORAL | Status: DC
  Administered 2016-08-06: 11:00:00 10 mg via ORAL
  Filled 2016-08-05: qty 1

## 2016-08-05 MED ORDER — ANGIOPLASTY BOOK
Freq: Once | Status: DC
Start: 2016-08-06 — End: 2016-08-06
  Filled 2016-08-05: qty 1

## 2016-08-05 MED ORDER — FENTANYL CITRATE (PF) 100 MCG/2ML IJ SOLN
INTRAMUSCULAR | Status: DC | PRN
  Administered 2016-08-05 (×3): 25 ug via INTRAVENOUS

## 2016-08-05 MED ORDER — ASPIRIN 81 MG PO CHEW: 81.0000 mg | CHEWABLE_TABLET | Freq: Every day | ORAL | Status: DC

## 2016-08-05 MED ORDER — BIVALIRUDIN 250 MG IV SOLR
INTRAVENOUS | Status: AC
  Filled 2016-08-05: qty 250

## 2016-08-05 MED ORDER — HEPARIN (PORCINE) IN NACL 2-0.9 UNIT/ML-% IJ SOLN
INTRAMUSCULAR | Status: AC
  Filled 2016-08-05: qty 1000

## 2016-08-05 MED ORDER — ASPIRIN 81 MG PO CHEW
81.0000 mg | CHEWABLE_TABLET | Freq: Every day | ORAL | Status: DC
  Administered 2016-08-06: 81 mg via ORAL
  Filled 2016-08-05: qty 1

## 2016-08-05 MED ORDER — SODIUM CHLORIDE 0.9% FLUSH
3.0000 mL | INTRAVENOUS | Status: DC | PRN
  Administered 2016-08-05: 3 mL via INTRAVENOUS
  Filled 2016-08-05: qty 3

## 2016-08-05 MED ORDER — MIDAZOLAM HCL 2 MG/2ML IJ SOLN
INTRAMUSCULAR | Status: DC | PRN
  Administered 2016-08-05: 2 mg via INTRAVENOUS
  Administered 2016-08-05 (×2): 1 mg via INTRAVENOUS

## 2016-08-05 MED ORDER — FENTANYL CITRATE (PF) 100 MCG/2ML IJ SOLN
INTRAMUSCULAR | Status: AC
  Filled 2016-08-05: qty 2

## 2016-08-05 MED ORDER — BIVALIRUDIN BOLUS VIA INFUSION - CUPID
INTRAVENOUS | Status: DC | PRN
  Administered 2016-08-05: 74.85 mg via INTRAVENOUS

## 2016-08-05 MED ORDER — HYDRALAZINE HCL 20 MG/ML IJ SOLN: 5.0000 mg | INTRAMUSCULAR | Status: AC | PRN

## 2016-08-05 MED ORDER — SODIUM CHLORIDE 0.9 % WEIGHT BASED INFUSION: 1.0000 mL/kg/h | INTRAVENOUS | Status: DC

## 2016-08-05 MED ORDER — HYDRALAZINE HCL 20 MG/ML IJ SOLN: 10.0000 mg | Freq: Three times a day (TID) | INTRAMUSCULAR | Status: DC | PRN

## 2016-08-05 MED ORDER — ONDANSETRON HCL 4 MG/2ML IJ SOLN: 4.0000 mg | Freq: Four times a day (QID) | INTRAMUSCULAR | Status: DC | PRN

## 2016-08-05 SURGICAL SUPPLY — 23 items
BALLN ANGIOSCULPT RX 2.5X10 (BALLOONS) ×2
BALLN MOZEC 2.0X12 (BALLOONS) ×2
BALLN MOZEC 2.50X20 (BALLOONS) ×2
BALLN ~~LOC~~ EMERGE MR 4.0X12 (BALLOONS) ×2
BALLOON ANGIOSCULPT RX 2.5X10 (BALLOONS) ×1 IMPLANT
BALLOON MOZEC 2.0X12 (BALLOONS) ×1 IMPLANT
BALLOON MOZEC 2.50X20 (BALLOONS) ×1 IMPLANT
BALLOON ~~LOC~~ EMERGE MR 4.0X12 (BALLOONS) ×1 IMPLANT
CATH INFINITI 5FR MULTPACK ANG (CATHETERS) ×2 IMPLANT
DEVICE WIRE ANGIOSEAL 6FR (Vascular Products) ×2 IMPLANT
KIT ENCORE 26 ADVANTAGE (KITS) ×2 IMPLANT
KIT HEART LEFT (KITS) ×2 IMPLANT
PACK CARDIAC CATHETERIZATION (CUSTOM PROCEDURE TRAY) ×2 IMPLANT
SHEATH PINNACLE 5F 10CM (SHEATH) ×2 IMPLANT
SHEATH PINNACLE 6F 10CM (SHEATH) ×2 IMPLANT
STENT PROMUS PREM MR 3.5X28 (Permanent Stent) ×2 IMPLANT
SYR MEDRAD MARK V 150ML (SYRINGE) ×2 IMPLANT
TRANSDUCER W/STOPCOCK (MISCELLANEOUS) ×2 IMPLANT
TUBING CIL FLEX 10 FLL-RA (TUBING) ×2 IMPLANT
VALVE GUARDIAN II ~~LOC~~ HEMO (MISCELLANEOUS) ×2 IMPLANT
WIRE ASAHI PROWATER 180CM (WIRE) ×2 IMPLANT
WIRE EMERALD 3MM-J .035X150CM (WIRE) ×2 IMPLANT
WIRE HI TORQ BMW 190CM (WIRE) ×2 IMPLANT

## 2016-08-05 NOTE — H&P (View-Only) (Signed)
Clinical Summary Mr. Barmore is a 66 y.o.male seen today for follow up of the following medical problems.   1. CAD - hx of CABG in 2001, at [facility].  - GXT 09/2012 with no ischemic changes, did have frequent PVCs, 3 beat run of NSVT - echo 01/2013 LVEF 50%, grade I diastoilc dysfunction.  - recent chest pain symptoms started about 2 weeks ago. Across entire chest into left side, burning like pain. Can be severe. Tends to occur with exertion, resolves with rest. Not a constant pain. No SOB, no N/V/D, no diaphoresis. Longest is a few minutes - 2 episodes during major snow while using snow blower. Can occur with other forms of exertion.  - compliant with meds  - echo 3/32018 LVEF 40%, grade I diastolic dysfunction - 06/2016 nuclear stress primarily scar with mild peri-infarct ischemia. LVEF 40%, intermediate risk.   - has had some chest pain. Throbbing pain midchest, can last up to 30 minutes. Can occur with exertion at times. Can sometimes get better with antacid some times. Some times comes on after eating.   2. Hyperlipidemia -compliant with statin - 09/2015 lipid panel shows TC 151 HDL 40 TG 81 LDL 94  3, HTN - he is compliant with meds    Past Medical History:  Diagnosis Date  . CAD in native artery   . Erectile dysfunction   . Hyperlipidemia, mixed   . Myocardial infarction    Out of hospital myocardial infarction/inferior wall, status post Cardiolite study 2007.  Ejection fraction 45%  . Unspecified essential hypertension      Allergies  Allergen Reactions  . Acetaminophen     REACTION: hallucinations     Current Outpatient Prescriptions  Medication Sig Dispense Refill  . aspirin 81 MG tablet Take 81 mg by mouth daily.      Marland Kitchen atorvastatin (LIPITOR) 80 MG tablet Take 1 tablet (80 mg total) by mouth daily. 90 tablet 3  . CIALIS 10 MG tablet Take 1 tablet by mouth Once daily as needed.    Marland Kitchen lisinopril (PRINIVIL,ZESTRIL) 10 MG tablet TAKE 1 TABLET BY  MOUTH EVERY DAY 90 tablet 3  . metoprolol succinate (TOPROL-XL) 50 MG 24 hr tablet Take 1 & 1/2 by mouth daily 135 tablet 3  . Multiple Vitamin (MULTIVITAMIN) tablet Take 1 tablet by mouth daily. Doesn't take this everyday    . nitroGLYCERIN (NITROSTAT) 0.4 MG SL tablet Place 0.4 mg under the tongue every 5 (five) minutes as needed.     . Ranitidine HCl (ZANTAC PO) Take by mouth as needed.     No current facility-administered medications for this visit.      Past Surgical History:  Procedure Laterality Date  . CATARACT EXTRACTION    . CORONARY ARTERY BYPASS GRAFT     2001.  LIMA to LAD, SVG to OM1, SVG to OM 2, SVG to diagonal, SVG to PDA.  Marland Kitchen LAPAROSCOPIC CHOLECYSTECTOMY  06/22/2013   Dr. Cristy Folks     Allergies  Allergen Reactions  . Acetaminophen     REACTION: hallucinations      No family history on file.   Social History Mr. Vanderheiden reports that he has never smoked. He has never used smokeless tobacco. Mr. Glorioso has no alcohol history on file.   Review of Systems CONSTITUTIONAL: No weight loss, fever, chills, weakness or fatigue.  HEENT: Eyes: No visual loss, blurred vision, double vision or yellow sclerae.No hearing loss, sneezing, congestion, runny nose or sore throat.  SKIN: No rash or itching.  CARDIOVASCULAR: per hpi RESPIRATORY: No shortness of breath, cough or sputum.  GASTROINTESTINAL: No anorexia, nausea, vomiting or diarrhea. No abdominal pain or blood.  GENITOURINARY: No burning on urination, no polyuria NEUROLOGICAL: No headache, dizziness, syncope, paralysis, ataxia, numbness or tingling in the extremities. No change in bowel or bladder control.  MUSCULOSKELETAL: No muscle, back pain, joint pain or stiffness.  LYMPHATICS: No enlarged nodes. No history of splenectomy.  PSYCHIATRIC: No history of depression or anxiety.  ENDOCRINOLOGIC: No reports of sweating, cold or heat intolerance. No polyuria or polydipsia.  Marland Kitchen   Physical Examination Vitals:    07/18/16 1534  BP: 136/73  Pulse: 80   Vitals:   07/18/16 1534  Weight: 223 lb 3.2 oz (101.2 kg)  Height:  (1.88 m)    Gen: resting comfortably, no acute distress HEENT: no scleral icterus, pupils equal round and reactive, no palptable cervical adenopathy,  CV: RRR, no m/r/g, n ojvd Resp: Clear to auscultation bilaterally GI: abdomen is soft, non-tender, non-distended, normal bowel sounds, no hepatosplenomegaly MSK: extremities are warm, no edema.  Skin: warm, no rash Neuro:  no focal deficits Psych: appropriate affect   Diagnostic Studies 01/2013 Echo Study Conclusions  - Left ventricle: Systolic function is low normal, with an estimated EF of 50-55%, but closer to 50%. There is mild global hypokinesis. The cavity size was normal. Wall thickness was increased in a pattern of mild LVH. There was an increased relative contribution of atrial contraction to ventricular filling. Doppler parameters are consistent with abnormal left ventricular relaxation (grade 1 diastolic dysfunction). - Ventricular septum: Motion consistent with post-operative changes. The contour showed diastolic flattening. - Left atrium: The atrium was mildly dilated.   09/2012 ETT Exc 6 min 50 sec, 10.1 METs. No specific ischemic changes, frequent PVCs and PACs, 3 beat run of NSVT.   06/2016 Nuclear stress test  ECG nondiagnostic due to IVCD. No arrhythmias noted during exercise.  Blood pressure demonstrated a hypertensive response to exercise.  Medium, moderate intensity, inferior/inferolateral defect extended from apex to base. There is a partially reversible zone in the inferolateral wall at the base. Most suggestive of scar with mild peri-infarct ischemia.  This is an intermediate risk study.  Nuclear stress EF: 40%. Consider confirmation with echocardiography.   07/2016 echo Study Conclusions  - Left ventricle: The cavity size was at the upper limits of   normal. Wall thickness was  increased in a pattern of mild LVH.   The estimated ejection fraction was 40%. Diffuse hypokinesis.   There is akinesis of the basalinferior myocardium. Doppler   parameters are consistent with abnormal left ventricular   relaxation (grade 1 diastolic dysfunction). - Aortic valve: Mildly calcified annulus. Trileaflet. - Mitral valve: Mildly thickened leaflets . There was mild   regurgitation. - Left atrium: The atrium was mildly dilated. - Tricuspid valve: There was trivial regurgitation. - Pulmonary arteries: Systolic pressure could not be accurately   estimated. - Pericardium, extracardiac: There was no pericardial effusion.  Impressions:  - Mild LVH with upper normal chamber size and LVEF approximately   40%. There is diffuse hypokinesis with inferior basal akinesis.   Probable grade 1 diastolic dysfunction. Mild left atrial   enlargement. Mildly thickened mitral leaflets with mild mitral   regurgitation.  Assessment and Plan  1. CAD/Chronic systolic HF/ICM - recent chest pain symptoms. Nuclear stress with evidence of ischemia, echo shows decrease in LVEF - we will plan for LHC, patient has requested to  be completed first week of April.  - increase Toprol XL to  daily in setting of decreased LVEF  2. Hyperlipidemia - his lipids have been at goal, continue  statin  3. HTN - at goal, continue current meds   F/u 1 month  I have reviewed the risks, indications, and alternatives to cardiac catheterization, possible angioplasty, and stenting with the patient . Risks include but are not limited to bleeding, infection, vascular injury, stroke, myocardial infection, arrhythmia, kidney injury, radiation-related injury in the case of prolonged fluoroscopy use, emergency cardiac surgery, and death. The patient understands the risks of serious complication is 1-2 in 1000 with diagnostic cardiac cath and 1-2% or less with angioplasty/stenting.     Antoine Poche, M.D.,  F.A.C.C.

## 2016-08-05 NOTE — Care Management Note (Signed)
Case Management Note  Patient Details  Name: Michael Melton MRN: [id number] Date of Birth: Jun 28, 1950  Subjective/Objective:     s/p coronary stent intervention, will be on plavix and asa, NCM will cont to follow for dc needs.               Action/Plan:   Expected Discharge Date:                  Expected Discharge Plan:  Home/Self Care  In-House Referral:     Discharge planning Services  CM Consult  Post Acute Care Choice:    Choice offered to:     DME Arranged:    DME Agency:     HH Arranged:    HH Agency:     Status of Service:  In process, will continue to follow  If discussed at Long Length of Stay Meetings, dates discussed:    Additional Comments:  Leone Haven, RN 08/05/2016, 2:22 PM

## 2016-08-05 NOTE — Discharge Summary (Signed)
The patient has been seen in conjunction with Laverda Page, NP-C. All aspects of care have been considered and discussed. The patient has been personally interviewed, examined, and all clinical data has been reviewed.   Bypass graft failure documented by coronary angiography including atresia of LIMA to LAD, occlusion of SVG to RCA, occlusion of SVG to circumflex/OM 2, occlusion of SVG to obtuse marginal 1. Patent SVG to the diagonal, representing the only patent graft.  Successful native circumflex stent. Decreased LV function with estimated EF 35%.  No recurrent symptoms.  All laboratory data is stable. Discharge this morning and will need outpatient up titration of heart failure therapy   Discharge Summary    Patient ID: Michael Melton,  MRN: [id number], DOB/AGE: [birth year] 66 y.o.  Admit date: 08/05/2016 Discharge date: 08/06/2016  Primary Care Provider: Estanislado Pandy Primary Cardiologist: Branch  Discharge Diagnoses    Principal Problem:   Abnormal nuclear stress test Active Problems:   Status post coronary artery bypass grafting   Other chest pain   Chronic combined systolic and diastolic heart failure (HCC)   Allergies Allergies  Allergen Reactions  . Acetaminophen     REACTION: hallucinations    Diagnostic Studies/Procedures    LHC: 08/05/16  Conclusion     Severe native three-vessel coronary artery disease.  Mid RCA lesion, 100 %stenosed. SVG to PDA is occluded. There are right to right and left to right collaterals.  Mid LAD lesion, 100 %stenosed. LIMA to LAD is atretic. LAD fills from flow from the SVG to diagonal filling the LAD.  Ost 2nd Mrg to 2nd Mrg lesion, 99 %stenosed. SVG to OM2 is occluded.  Ost 1st Mrg to 1st Mrg lesion, 80 %stenosed. Free RIMA graft to OM 1 is occluded. A STENT PROMUS PREM MR 3.5X28 drug eluting stent was successfully placed., postdilated to > 4mm.  Post intervention, there is a 0% residual stenosis.  Mid Cx  lesion, 80 %stenosed. THis was treated with initial cutting balloon angioplasty with a 2.5 mm balloon prior to stent placement in the OM. Then after stent placement, PTCA was done with a 2.0 balloon.  Post intervention, there is a 10% residual stenosis.  The left ventricular ejection fraction is 25-35% by visual estimate.  LV end diastolic pressure is moderately elevated.  There is moderate to severe left ventricular systolic dysfunction.  There is no aortic valve stenosis.   Severe coronary artery disease. Severe graft disease. Only patent graft is the SVG to diagonal which feeds both the diagonal and the LAD. Successful stenting of a large OM1 and balloon angioplasty of the mid circumflex. The RCA territory fills by right to right and left to right collaterals. Continue aggressive secondary prevention. He'll need dual antiplatelet therapy for at least a year. Consider clopidogrel longer-term given the extent of his disease.   _____________   History of Present Illness     Michael Melton is a 66 yo male with PMH of CAD s/p CABG in 2011, ICM, HL and HTN who is followed by Dr. Wyline Mood. Has GXT in 5/14 that showed no ischemic changes. Last underwent 2/18 with nuclear stress with primary scar with mid peri-infarct ischemia, with EF repotnoted at intermediate risk. He presented to the office on 07/18/16 with ongoing chest pain that was worse with exertion. Reported 2 episodes while using the snow blower. Given reported symptoms he was planned for LHC. His Toprol XL was increased to  daily in the setting of reduced EF.  Hospital Course     Consultants: None  He presented for outpatient cath on 08/05/16 with Dr. Eldridge Dace showing both severe CAD and graft disease, with the only patent graft being the SVG to the diag/LAD. Had successful stenting of the large OM1 with balloon angioplasty of the mid circumflex. Plan to continue DAPT with ASA and plavix for at least one year.   He felt well post  procedure. No chest pain. Labs were stable, along with cath site. Worked with cardiac rehab without complaints. Noted to have a decreased EF this admission. Currently on medical therapy with Toprol XL, lisinopril, ASA and Plavix. Given that he received contrast, will hold on adding spironolactone, but consider addition, and up tiration of medications at follow up visit. He was seen and assessed by Dr. Katrinka Blazing on 08/06/16 and determined stable for discharge home. Follow up has been arranged in the office. Zantac was changed to Protonix given the addition of Plavix.   Physical Exam:  Gen: Pleasant WM, no acute distress. HEENT: no scleral icterus, pupils equal round and reactive, no palptable cervical adenopathy,  CV: RRR, no m/r/g, no JVD Resp: Clear to auscultation bilaterally GI: abdomen is soft, non-tender, non-distended, normal bowel sounds, no hepatosplenomegaly MSK: extremities are warm, no edema. Right groin site without bruising or hematoma.  Skin: warm, no rash Neuro:  no focal deficits Psych: appropriate affect _____________  Discharge Vitals Blood pressure (!) 152/67, pulse 64, temperature 98.4 F (36.9 C), temperature source Oral, resp. rate 19, height  (1.88 m), weight 222 lb 7.1 oz (100.9 kg), SpO2 93 %.  Filed Weights   08/05/16 0758 08/06/16 0300  Weight: 220 lb (99.8 kg) 222 lb 7.1 oz (100.9 kg)    Labs & Radiologic Studies    CBC  Recent Labs  08/06/16 0255  WBC 9.8  HGB 13.0  HCT 40.2  MCV 89.7  PLT 160   Basic Metabolic Panel  Recent Labs  08/06/16 0255  NA 138  K 4.0  CL 104  CO2 27  GLUCOSE 101*  BUN 10  CREATININE 0.90  CALCIUM 8.7*   Liver Function Tests No results for input(s): AST, ALT, ALKPHOS, BILITOT, PROT, ALBUMIN in the last 72 hours. No results for input(s): LIPASE, AMYLASE in the last 72 hours. Cardiac Enzymes No results for input(s): CKTOTAL, CKMB, CKMBINDEX, TROPONINI in the last 72 hours. BNP Invalid input(s):  POCBNP D-Dimer No results for input(s): DDIMER in the last 72 hours. Hemoglobin A1C No results for input(s): HGBA1C in the last 72 hours. Fasting Lipid Panel No results for input(s): CHOL, HDL, LDLCALC, TRIG, CHOLHDL, LDLDIRECT in the last 72 hours. Thyroid Function Tests No results for input(s): TSH, T4TOTAL, T3FREE, THYROIDAB in the last 72 hours.  Invalid input(s): FREET3 _____________  No results found. Disposition   Pt is being discharged home today in good condition.  Follow-up Plans & Appointments    Follow-up Information    Jacolyn Reedy, PA-C Follow up on 08/14/2016.   Specialty:  Cardiology Why:  at 2pm for your follow up appt. Contact information: [address] [place] [address] [phone]          Discharge Instructions    (HEART FAILURE PATIENTS) Call MD:  Anytime you have any of the following symptoms: 1) 3 pound weight gain in 24 hours or 5 pounds in 1 week 2) shortness of breath, with or without a dry hacking cough 3) swelling in the hands, feet or stomach 4) if you have to sleep  on extra pillows at night in order to breathe.    Complete by:  As directed    Amb Referral to Cardiac Rehabilitation    Complete by:  As directed    Diagnosis:  Coronary Stents   Call MD for:  redness, tenderness, or signs of infection (pain, swelling, redness, odor or green/yellow discharge around incision site)    Complete by:  As directed    Diet - low sodium heart healthy    Complete by:  As directed    Discharge instructions    Complete by:  As directed    Groin Site Care Refer to this sheet in the next few weeks. These instructions provide you with information on caring for yourself after your procedure. Your caregiver may also give you more specific instructions. Your treatment has been planned according to current medical practices, but problems sometimes occur. Call your caregiver if you have any problems or questions after your procedure. HOME CARE  INSTRUCTIONS You may shower 24 hours after the procedure. Remove the bandage (dressing) and gently wash the site with plain soap and water. Gently pat the site dry.  Do not apply powder or lotion to the site.  Do not sit in a bathtub, swimming pool, or whirlpool for 5 to 7 days.  No bending, squatting, or lifting anything over 10 pounds (4.5 kg) as directed by your caregiver.  Inspect the site at least twice daily.  Do not drive home if you are discharged the same day of the procedure. Have someone else drive you.  You may drive 24 hours after the procedure unless otherwise instructed by your caregiver.  What to expect: Any bruising will usually fade within 1 to 2 weeks.  Blood that collects in the tissue (hematoma) may be painful to the touch. It should usually decrease in size and tenderness within 1 to 2 weeks.  SEEK IMMEDIATE MEDICAL CARE IF: You have unusual pain at the groin site or down the affected leg.  You have redness, warmth, swelling, or pain at the groin site.  You have drainage (other than a small amount of blood on the dressing).  You have chills.  You have a fever or persistent symptoms for more than 72 hours.  You have a fever and your symptoms suddenly get worse.  Your leg becomes pale, cool, tingly, or numb.  You have heavy bleeding from the site. Hold pressure on the site. .   Increase activity slowly    Complete by:  As directed       Discharge Medications   Current Discharge Medication List    START taking these medications   Details  clopidogrel (PLAVIX) 75 MG tablet Take 1 tablet (75 mg total) by mouth daily with breakfast. Qty: 30 tablet, Refills: 11    pantoprazole (PROTONIX) 40 MG tablet Take 1 tablet (40 mg total) by mouth daily. Qty: 30 tablet, Refills: 6      CONTINUE these medications which have NOT CHANGED   Details  aspirin 81 MG tablet Take 81 mg by mouth daily.      atorvastatin (LIPITOR) 80 MG tablet Take 1 tablet (80 mg total) by mouth  daily. Qty: 90 tablet, Refills: 3    lisinopril (PRINIVIL,ZESTRIL) 10 MG tablet TAKE 1 TABLET BY MOUTH EVERY DAY Qty: 90 tablet, Refills: 3    metoprolol succinate (TOPROL-XL) 100 MG 24 hr tablet Take 1 tablet (100 mg total) by mouth daily. Take with or immediately following a meal. Qty: 90 tablet,  Refills: 1    Multiple Vitamin (MULTIVITAMIN) tablet Take 1 tablet by mouth daily. Doesn't take this everyday    nitroGLYCERIN (NITROSTAT) 0.4 MG SL tablet Place 0.4 mg under the tongue every 5 (five) minutes as needed.       STOP taking these medications     CIALIS 10 MG tablet      ranitidine (ZANTAC) 150 MG tablet          Aspirin prescribed at discharge?  Yes High Intensity Statin Prescribed? (Lipitor 40-80mg  or Crestor 20-40mg ): Yes Beta Blocker Prescribed? Yes For EF <40%, was ACEI/ARB Prescribed? Yes ADP Receptor Inhibitor Prescribed? (i.e. Plavix etc.-Includes Medically Managed Patients): Yes For EF <40%, Aldosterone Inhibitor Prescribed? No: Consider in at outpatient follow up Was EF assessed during THIS hospitalization? Yes Was Cardiac Rehab II ordered? (Included Medically managed Patients): Yes   Outstanding Labs/Studies   BMET, follow up echo in 6 weeks for LV function.   Duration of Discharge Encounter   Greater than 30 minutes including physician time.  Signed, Laverda Page NP-C 08/06/2016, 9:26 AM

## 2016-08-05 NOTE — Interval H&P Note (Signed)
Cath Lab Visit (complete for each Cath Lab visit)  Clinical Evaluation Leading to the Procedure:   ACS: No.  Non-ACS:    Anginal Classification: CCS III  Anti-ischemic medical therapy: Minimal Therapy (1 class of medications)  Non-Invasive Test Results: Intermediate-risk stress test findings: cardiac mortality 1-3%/year  Prior CABG: Previous CABG      History and Physical Interval Note:  08/05/2016 9:03 AM  Michael Melton  has presented today for surgery, with the diagnosis of cp  The various methods of treatment have been discussed with the patient and family. After consideration of risks, benefits and other options for treatment, the patient has consented to  Procedure(s): Left Heart Cath and Coronary Angiography (N/A) as a surgical intervention .  The patient's history has been reviewed, patient examined, no change in status, stable for surgery.  I have reviewed the patient's chart and labs.  Questions were answered to the patient's satisfaction.     Lance Muss

## 2016-08-06 ENCOUNTER — Encounter (HOSPITAL_COMMUNITY): Payer: Self-pay | Admitting: Cardiology

## 2016-08-06 ENCOUNTER — Telehealth: Payer: Self-pay

## 2016-08-06 DIAGNOSIS — I5042 Chronic combined systolic (congestive) and diastolic (congestive) heart failure: Secondary | ICD-10-CM

## 2016-08-06 DIAGNOSIS — I2582 Chronic total occlusion of coronary artery: Secondary | ICD-10-CM | POA: Diagnosis not present

## 2016-08-06 DIAGNOSIS — I25810 Atherosclerosis of coronary artery bypass graft(s) without angina pectoris: Secondary | ICD-10-CM | POA: Diagnosis not present

## 2016-08-06 DIAGNOSIS — R0789 Other chest pain: Secondary | ICD-10-CM | POA: Diagnosis not present

## 2016-08-06 DIAGNOSIS — R9439 Abnormal result of other cardiovascular function study: Secondary | ICD-10-CM

## 2016-08-06 DIAGNOSIS — I252 Old myocardial infarction: Secondary | ICD-10-CM | POA: Diagnosis not present

## 2016-08-06 DIAGNOSIS — R079 Chest pain, unspecified: Secondary | ICD-10-CM | POA: Diagnosis not present

## 2016-08-06 LAB — BASIC METABOLIC PANEL
ANION GAP: 7 (ref 5–15)
BUN: 10 mg/dL (ref 6–20)
CALCIUM: 8.7 mg/dL — AB (ref 8.9–10.3)
CO2: 27 mmol/L (ref 22–32)
Chloride: 104 mmol/L (ref 101–111)
Creatinine, Ser: 0.9 mg/dL (ref 0.61–1.24)
GFR calc non Af Amer: >60 mL/min (ref >60)
Glucose, Bld: 101 mg/dL — ABNORMAL HIGH (ref 65–99)
POTASSIUM: 4 mmol/L (ref 3.5–5.1)
Sodium: 138 mmol/L (ref 135–145)

## 2016-08-06 LAB — CBC
HEMATOCRIT: 40.2 % (ref 39.0–52.0)
HEMOGLOBIN: 13 g/dL (ref 13.0–17.0)
MCH: 29 pg (ref 26.0–34.0)
MCHC: 32.3 g/dL (ref 30.0–36.0)
MCV: 89.7 fL (ref 78.0–100.0)
Platelets: 160 10*3/uL (ref 150–400)
RBC: 4.48 MIL/uL (ref 4.22–5.81)
RDW: 13 % (ref 11.5–15.5)
WBC: 9.8 10*3/uL (ref 4.0–10.5)

## 2016-08-06 MED ORDER — PANTOPRAZOLE SODIUM 40 MG PO TBEC: 40.0000 mg | DELAYED_RELEASE_TABLET | Freq: Every day | ORAL | 6 refills | Status: DC

## 2016-08-06 MED ORDER — CLOPIDOGREL BISULFATE 75 MG PO TABS
75.0000 mg | ORAL_TABLET | Freq: Every day | ORAL | 11 refills | Status: DC
Start: 2016-08-06 — End: 2017-12-26

## 2016-08-06 NOTE — Progress Notes (Signed)
CARDIAC REHAB PHASE I   PRE:  Rate/Rhythm: 64 SR  BP:  Supine: 154/73  Sitting:   Standing:    SaO2: 93%RA  MODE:  Ambulation: 800 ft   POST:  Rate/Rhythm: 74 SR  BP:  Supine: 157/65  Sitting:   Standing:    SaO2: 97%RA 0805-0917 Pt walked 800 ft with steady gait. Tolerated well. No CP. Gave pt CHF booklet and reviewed signs/symptoms of when to call MD. Encouraged 2000 mg sodium restriction, 2L FR, and daily weights. Gave heart healthy and low sodium diets. Discussed CRP 2 and will refer to [facility]. Discussed importance of plavix with stent and reviewed NTG use. Pt voiced understanding of ed.   Luetta Nutting, RN BSN  08/06/2016 9:13 AM

## 2016-08-06 NOTE — Telephone Encounter (Signed)
-----   Message from Dyane Dustman sent at 08/06/2016  8:14 AM EDT ----- Regarding: TCM TCM - patient to be discharged 4/3.Marland Kitchen  Thanks, [employer]

## 2016-08-06 NOTE — Telephone Encounter (Signed)
LMTCB, apt Wed 4/11 at 2 pm with m lenze

## 2016-08-08 ENCOUNTER — Telehealth: Payer: Self-pay

## 2016-08-08 NOTE — Telephone Encounter (Signed)
Patient cancelled TCM apt here in [place] and made apt in [place] office

## 2016-08-08 NOTE — Telephone Encounter (Signed)
-----   Message from Terry L Goins sent at 08/06/2016  8:14 AM EDT ----- Regarding: TCM TCM - patient to be discharged 4/3..  Thanks, Terry 

## 2016-08-14 ENCOUNTER — Encounter: Payer: 59 | Admitting: Physician Assistant

## 2016-09-05 ENCOUNTER — Ambulatory Visit (INDEPENDENT_AMBULATORY_CARE_PROVIDER_SITE_OTHER): Payer: 59 | Admitting: Cardiology

## 2016-09-05 ENCOUNTER — Encounter: Payer: Self-pay | Admitting: Cardiology

## 2016-09-05 VITALS — BP 131/76 | HR 64 | Ht 74.0 in | Wt 219.2 lb

## 2016-09-05 DIAGNOSIS — R079 Chest pain, unspecified: Secondary | ICD-10-CM

## 2016-09-05 DIAGNOSIS — I5022 Chronic systolic (congestive) heart failure: Secondary | ICD-10-CM | POA: Diagnosis not present

## 2016-09-05 DIAGNOSIS — I2510 Atherosclerotic heart disease of native coronary artery without angina pectoris: Secondary | ICD-10-CM

## 2016-09-05 NOTE — Patient Instructions (Signed)
Your physician wants you to follow-up in: [facility] You will receive a reminder letter in the mail two months in advance. If you don't receive a letter, please call our office to schedule the follow-up appointment.  Your physician has recommended you make the following change in your medication:   DECREASE LIPITOR 40 MG DAILY  Thank you for choosing [facility]!!

## 2016-09-05 NOTE — Progress Notes (Signed)
Clinical Summary [name] is a 66 y.o.male seen today for follow up of the following medical problems. This is a focused visit on his history of CAD.   1. CAD - h[facility] of [facility] in 2001, [facility].  - GXT 09/2012 with no ischemic changes, did have frequent PVCs, 3 beat run of NSVT - echo 01/2013 LVEF 50%, grade I diastoilc dysfunction.  - recent chest pain symptoms started about 2 weeks ago. Across entire chest into left side, burning like pain. Can be severe. Tends to occur with exertion, resolves with rest. Not a constant pain. No SOB, no N/V/D, no diaphoresis. Longest is a few minutes - 2 episodes during major snow while using snow blower. Can occur with other forms of exertion.  - compliant with meds  - echo 3/32018 LVEF 40%, grade I diastolic dysfunction - 06/2016 nuclear stress primarily scar with mild peri-infarct ischemia. LVEF 40%, intermediate risk.    - cath 08/2016: mid LAD occluded, mid LCX 80%, RCA 100%. OM 1 80%. OM2 99%. SVG-RPDA occluded, LIMA-LAD atretic and occluded in midportion, LAD fills from SVG-diag. SVG-D2 patent, RIMA-OM1 occluded, SVG-OM2 occluded. RCA territory fills by collaterals Received angiolpasty to LCX, OM1 80% receivded DES. Recs for indefinite plavix.    - no recent chest pain since last visit. No SOB or DOE. Compliant with meds    Past Medical History:  Diagnosis Date  . CAD in native artery    08/05/16 Cath, atresia of LIMA to LAD, occlusion of SVG-->RCA, SVG-->Circumflex/OM2, SVG-->OM1, patent SVG to diag, DES to native circumflex.   . Erectile dysfunction   . Hyperlipidemia, mixed   . Myocardial infarction [facility](HCC)    Out of hospital myocardial infarction/inferior wall, status post Cardiolite study 2007.  Ejection fraction 45%  . Unspecified essential hypertension      Allergies  Allergen Reactions  . Acetaminophen     REACTION: hallucinations     Current Outpatient Prescriptions  Medication Sig Dispense Refill  . aspirin 81 MG  tablet Take 81 mg by mouth daily.      Marland Kitchen. atorvastatin (LIPITOR) 80 MG tablet Take 1 tablet (80 mg total) by mouth daily. 90 tablet 3  . clopidogrel (PLAVIX) 75 MG tablet Take 1 tablet (75 mg total) by mouth daily with breakfast. 30 tablet 11  . lisinopril (PRINIVIL,ZESTRIL) 10 MG tablet TAKE 1 TABLET BY MOUTH EVERY DAY 90 tablet 3  . metoprolol succinate (TOPROL-XL) 100 MG 24 hr tablet Take 1 tablet (100 mg total) by mouth daily. Take with or immediately following a meal. 90 tablet 1  . Multiple Vitamin (MULTIVITAMIN) tablet Take 1 tablet by mouth daily. Doesn't take this everyday    . nitroGLYCERIN (NITROSTAT) 0.4 MG SL tablet Place 0.4 mg under the tongue every 5 (five) minutes as needed.     . pantoprazole (PROTONIX) 40 MG tablet Take 1 tablet (40 mg total) by mouth daily. 30 tablet 6   No current facility-administered medications for this visit.      Past Surgical History:  Procedure Laterality Date  . CATARACT EXTRACTION    . CORONARY ARTERY BYPASS GRAFT     2001.  LIMA to LAD, SVG to OM1, SVG to OM 2, SVG to diagonal, SVG to PDA.  Marland Kitchen. CORONARY BALLOON ANGIOPLASTY N/A 08/05/2016   Procedure: Coronary Balloon Angioplasty;  Surgeon: Corky CraftsJayadeep S Varanasi, MD;  [facility]AS[facility] CV LAB;  Service: Cardiovascular;  Laterality: N/A;  CFX  . CORONARY STENT INTERVENTION N/A 08/05/2016  Procedure: Coronary Stent Intervention;  Surgeon: Corky Crafts, MD;  Location: [facility] INVASIVE CV LAB;  Service: Cardiovascular;  Laterality: N/A;  OM  . LAPAROSCOPIC CHOLECYSTECTOMY  06/22/2013   Dr. Cristy Folks  . LEFT HEART CATH AND CORS/GRAFTS ANGIOGRAPHY N/A 08/05/2016   Procedure: Left Heart Cath and Cors/Grafts Angiography;  Surgeon: Corky Crafts, MD;  Location: [facility] INVASIVE CV LAB;  Service: Cardiovascular;  Laterality: N/A;     Allergies  Allergen Reactions  . Acetaminophen     REACTION: hallucinations      No family history on file.   Social History [name] reports that he has never  smoked. He has never used smokeless tobacco. [name] reports that he drinks alcohol.   Review of Systems CONSTITUTIONAL: No weight loss, fever, chills, weakness or fatigue.  HEENT: Eyes: No visual loss, blurred vision, double vision or yellow sclerae.No hearing loss, sneezing, congestion, runny nose or sore throat.  SKIN: No rash or itching.  CARDIOVASCULAR: per hpi RESPIRATORY: No shortness of breath, cough or sputum.  GASTROINTESTINAL: No anorexia, nausea, vomiting or diarrhea. No abdominal pain or blood.  GENITOURINARY: No burning on urination, no polyuria NEUROLOGICAL: No headache, dizziness, syncope, paralysis, ataxia, numbness or tingling in the extremities. No change in bowel or bladder control.  MUSCULOSKELETAL: No muscle, back pain, joint pain or stiffness.  LYMPHATICS: No enlarged nodes. No history of splenectomy.  PSYCHIATRIC: No history of depression or anxiety.  ENDOCRINOLOGIC: No reports of sweating, cold or heat intolerance. No polyuria or polydipsia.  Marland Kitchen   Physical Examination Vitals:   09/05/16 1300  BP: 131/76  Pulse: 64   Vitals:   09/05/16 1300  Weight: 219 lb 3.2 oz (99.4 kg)  Height: 6\' 2"  (1.88 m)    Gen: resting comfortably, no acute distress HEENT: no scleral icterus, pupils equal round and reactive, no palptable cervical adenopathy,  CV: RRR, no mrg, no jvd Resp: Clear to auscultation bilaterally GI: abdomen is soft, non-tender, non-distended, normal bowel sounds, no hepatosplenomegaly MSK: extremities are warm, no edema.  Skin: warm, no rash Neuro:  no focal deficits Psych: appropriate affect   Diagnostic Studies 01/2013 Echo Study Conclusions  - Left ventricle: Systolic function is low normal, with an estimated EF of 50-55%, but closer to 50%. There is mild global hypokinesis. The cavity size was normal. Wall thickness was increased in a pattern of mild LVH. There was an increased relative contribution of atrial contraction to  ventricular filling. Doppler parameters are consistent with abnormal left ventricular relaxation (grade 1 diastolic dysfunction). - Ventricular septum: Motion consistent with post-operative changes. The contour showed diastolic flattening. - Left atrium: The atrium was mildly dilated.   09/2012 ETT Exc 6 min 50 sec, 10.1 METs. No specific ischemic changes, frequent PVCs and PACs, 3 beat run of NSVT.   06/2016 Nuclear stress test  ECG nondiagnostic due to IVCD. No arrhythmias noted during exercise.  Blood pressure demonstrated a hypertensive response to exercise.  Medium, moderate intensity, inferior/inferolateral defect extended from apex to base. There is a partially reversible zone in the inferolateral wall at the base. Most suggestive of scar with mild peri-infarct ischemia.  This is an intermediate risk study.  Nuclear stress EF: 40%. Consider confirmation with echocardiography.   07/2016 echo Study Conclusions  - Left ventricle: The cavity size was at the upper limits of normal. Wall thickness was increased in a pattern of mild LVH. The estimated ejection fraction was 40%. Diffuse hypokinesis. There is akinesis of the basalinferior myocardium. Doppler  parameters are consistent with abnormal left ventricular relaxation (grade 1 diastolic dysfunction). - Aortic valve: Mildly calcified annulus. Trileaflet. - Mitral valve: Mildly thickened leaflets . There was mild regurgitation. - Left atrium: The atrium was mildly dilated. - Tricuspid valve: There was trivial regurgitation. - Pulmonary arteries: Systolic pressure could not be accurately estimated. - Pericardium, extracardiac: There was no pericardial effusion.  Impressions:  - Mild LVH with upper normal chamber size and LVEF approximately 40%. There is diffuse hypokinesis with inferior basal akinesis. Probable grade 1 diastolic dysfunction. Mild left atrial enlargement. Mildly thickened  mitral leaflets with mild mitral regurgitation.   08/2016 cath  Severe native three-vessel coronary artery disease.  Mid RCA lesion, 100 %stenosed. SVG to PDA is occluded. There are right to right and left to right collaterals.  Mid LAD lesion, 100 %stenosed. LIMA to LAD is atretic. LAD fills from flow from the SVG to diagonal filling the LAD.  Ost 2nd Mrg to 2nd Mrg lesion, 99 %stenosed. SVG to OM2 is occluded.  Ost 1st Mrg to 1st Mrg lesion, 80 %stenosed. Free RIMA graft to OM 1 is occluded. A STENT PROMUS PREM MR 3.5X28 drug eluting stent was successfully placed., postdilated to > 4mm.  Post intervention, there is a 0% residual stenosis.  Mid Cx lesion, 80 %stenosed. THis was treated with initial cutting balloon angioplasty with a 2.5 mm balloon prior to stent placement in the OM. Then after stent placement, PTCA was done with a 2.0 balloon.  Post intervention, there is a 10% residual stenosis.  The left ventricular ejection fraction is 25-35% by visual estimate.  LV end diastolic pressure is moderately elevated.  There is moderate to severe left ventricular systolic dysfunction.  There is no aortic valve stenosis.   Severe coronary artery disease. Severe graft disease. Only patent graft is the SVG to diagonal which feeds both the diagonal and the LAD. Successful stenting of a large OM1 and balloon angioplasty of the mid circumflex. The RCA territory fills by right to right and left to right collaterals. Continue aggressive secondary prevention. He'll need dual antiplatelet therapy for at least a year. Consider clopidogrel longer-term given the extent of his disease.  Assessment and Plan  1. CAD/Chronic systolic HF/ICM - recent chest pain symptoms, s/p stent.  - symptoms have now resolved - continue medical therapy, indefinte DAPT        Antoine Poche, M.D.

## 2017-02-22 ENCOUNTER — Other Ambulatory Visit: Payer: Self-pay | Admitting: Cardiology

## 2017-02-28 IMAGING — NM NM MYOCAR MULTI W/SPECT W/WALL MOTION & EF
2 series · 12 of 12 positions shown · non-contrast
Comparison: none

[Series 1: rest · 8.28mm/px · 6 of 64 frames shown]
[frame 6/64]
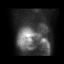
[frame 16/64]
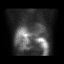
[frame 27/64]
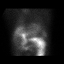
[frame 38/64]
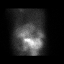
[frame 48/64]
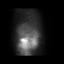
[frame 59/64]
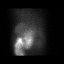

[Series 2: stress gated · 8.28mm/px · 6 of 64 frames shown]
[frame 6/64]
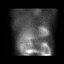
[frame 16/64]
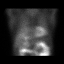
[frame 27/64]
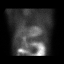
[frame 38/64]
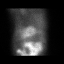
[frame 48/64]
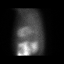
[frame 59/64]
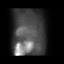

[12 of 12 positions shown; findings below may reference images not displayed]

Canned report from images found in remote index.

Refer to host system for actual result text.

## 2017-06-30 ENCOUNTER — Encounter: Payer: Self-pay | Admitting: Cardiology

## 2017-06-30 ENCOUNTER — Encounter: Payer: Self-pay | Admitting: *Deleted

## 2017-06-30 ENCOUNTER — Ambulatory Visit (INDEPENDENT_AMBULATORY_CARE_PROVIDER_SITE_OTHER): Payer: BC Managed Care – PPO | Admitting: Cardiology

## 2017-06-30 VITALS — BP 150/100 | HR 72 | Ht 74.0 in | Wt 222.0 lb

## 2017-06-30 DIAGNOSIS — I10 Essential (primary) hypertension: Secondary | ICD-10-CM | POA: Diagnosis not present

## 2017-06-30 DIAGNOSIS — I5022 Chronic systolic (congestive) heart failure: Secondary | ICD-10-CM | POA: Diagnosis not present

## 2017-06-30 DIAGNOSIS — E782 Mixed hyperlipidemia: Secondary | ICD-10-CM

## 2017-06-30 DIAGNOSIS — I2510 Atherosclerotic heart disease of native coronary artery without angina pectoris: Secondary | ICD-10-CM | POA: Diagnosis not present

## 2017-06-30 NOTE — Progress Notes (Signed)
Clinical Summary Mr. Corales is a 67 y.o.male seen today for follow up of the following medical problems.   1. CAD - hx of CABG in 2001, at [facility].  - GXT 09/2012 with no ischemic changes, did have frequent PVCs, 3 beat run of NSVT - echo 01/2013 LVEF 50%, grade I diastoilc dysfunction.  - recent chest pain symptoms started about 2 weeks ago. Across entire chest into left side, burning like pain. Can be severe. Tends to occur with exertion, resolves with rest. Not a constant pain. No SOB, no N/V/D, no diaphoresis. Longest is a few minutes - 2 episodes during major snow while using snow blower. Can occur with other forms of exertion.  - compliant with meds  - echo 3/32018 LVEF 40%, grade I diastolic dysfunction - 06/2016 nuclear stress primarily scar with mild peri-infarct ischemia. LVEF 40%, intermediate risk.    - cath 08/2016: mid LAD occluded, mid LCX 80%, RCA 100%. OM 1 80%. OM2 99%. SVG-RPDA occluded, LIMA-LAD atretic and occluded in midportion, LAD fills from SVG-diag. SVG-D2 patent, RIMA-OM1 occluded, SVG-OM2 occluded. RCA territory fills by collaterals Received angiolpasty to LCX, OM1 80% receivded DES. Recs for indefinite plavix.     - no recent chest pain. No SOB/DOE - compliant with meds  2. Hyperlipidemia  - 09/2015 lipid panel shows TC 151 HDL 40 TG 81 LDL 94 - compliant with statin  3, HTN -compliant with meds - saw pcp last week  bp 120s/70s  Past Medical History:  Diagnosis Date  . CAD in native artery    08/05/16 Cath, atresia of LIMA to LAD, occlusion of SVG-->RCA, SVG-->Circumflex/OM2, SVG-->OM1, patent SVG to diag, DES to native circumflex.   . Erectile dysfunction   . Hyperlipidemia, mixed   . Myocardial infarction [facility])    Out of hospital myocardial infarction/inferior wall, status post Cardiolite study 2007.  Ejection fraction 45%  . Unspecified essential hypertension      Allergies  Allergen Reactions  . Acetaminophen     REACTION:  hallucinations     Current Outpatient Medications  Medication Sig Dispense Refill  . aspirin 81 MG tablet Take 81 mg by mouth daily.      Marland Kitchen atorvastatin (LIPITOR) 40 MG tablet Take 40 mg by mouth daily.    . clopidogrel (PLAVIX) 75 MG tablet Take 1 tablet (75 mg total) by mouth daily with breakfast. 30 tablet 11  . lisinopril (PRINIVIL,ZESTRIL) 10 MG tablet TAKE 1 TABLET BY MOUTH EVERY DAY 90 tablet 3  . metoprolol succinate (TOPROL-XL) 100 MG 24 hr tablet TAKE 1 TABLET (100 MG TOTAL) BY MOUTH DAILY. TAKE WITH OR IMMEDIATELY FOLLOWING A MEAL. 90 tablet 1  . Multiple Vitamin (MULTIVITAMIN) tablet Take 1 tablet by mouth daily. Doesn't take this everyday    . nitroGLYCERIN (NITROSTAT) 0.4 MG SL tablet Place 0.4 mg under the tongue every 5 (five) minutes as needed.     . pantoprazole (PROTONIX) 40 MG tablet Take 1 tablet (40 mg total) by mouth daily. 30 tablet 6   No current facility-administered medications for this visit.      Past Surgical History:  Procedure Laterality Date  . CATARACT EXTRACTION    . CORONARY ARTERY BYPASS GRAFT     2001.  LIMA to LAD, SVG to OM1, SVG to OM 2, SVG to diagonal, SVG to PDA.  Marland Kitchen CORONARY BALLOON ANGIOPLASTY N/A 08/05/2016   Procedure: Coronary Balloon Angioplasty;  Surgeon: Corky Crafts, MD;  Location: [facility] INVASIVE CV LAB;  Service: Cardiovascular;  Laterality: N/A;  CFX  . CORONARY STENT INTERVENTION N/A 08/05/2016   Procedure: Coronary Stent Intervention;  Surgeon: Corky CraftsJayadeep S Varanasi, MD;  [facility];  Service: Cardiovascular;  Laterality: N/A;  OM  . LAPAROSCOPIC CHOLECYSTECTOMY  06/22/2013   Dr. Cristy FolksBeacham  . LEFT HEART CATH AND CORS/GRAFTS ANGIOGRAPHY N/A 08/05/2016   Procedure: Left Heart Cath and Cors/Grafts Angiography;  Surgeon: Corky CraftsJayadeep S Varanasi, MD;  [facility];  Service: Cardiovascular;  Laterality: N/A;     Allergies  Allergen Reactions  . Acetaminophen     REACTION: hallucinations      Family  History  Problem Relation Age of Onset  . Alzheimer's disease Mother   . Brain cancer Mother        brain tumor  . Stroke Father   . Alzheimer's disease Father      Social History Mr. Tavares reports that  has never smoked. he has never used smokeless tobacco. [name] reports that he drinks alcohol.   Review of Systems CONSTITUTIONAL: No weight loss, fever, chills, weakness or fatigue.  HEENT: Eyes: No visual loss, blurred vision, double vision or yellow sclerae.No hearing loss, sneezing, congestion, runny nose or sore throat.  SKIN: No rash or itching.  CARDIOVASCULAR: per hpi RESPIRATORY: No shortness of breath, cough or sputum.  GASTROINTESTINAL: No anorexia, nausea, vomiting or diarrhea. No abdominal pain or blood.  GENITOURINARY: No burning on urination, no polyuria NEUROLOGICAL: No headache, dizziness, syncope, paralysis, ataxia, numbness or tingling in the extremities. No change in bowel or bladder control.  MUSCULOSKELETAL: No muscle, back pain, joint pain or stiffness.  LYMPHATICS: No enlarged nodes. No history of splenectomy.  PSYCHIATRIC: No history of depression or anxiety.  ENDOCRINOLOGIC: No reports of sweating, cold or heat intolerance. No polyuria or polydipsia.  Marland Kitchen.   Physical Examination Vitals:   06/30/17 1021  BP: (!) 150/100  Pulse: 72  SpO2: 98%   Vitals:   06/30/17 1021  Weight: 222 lb (100.7 kg)  Height: 6\' 2"  (1.88 m)    Gen: resting comfortably, no acute distress HEENT: no scleral icterus, pupils equal round and reactive, no palptable cervical adenopathy,  CV: RRR, no m/r/g, no jvd Resp: Clear to auscultation bilaterally GI: abdomen is soft, non-tender, non-distended, normal bowel sounds, no hepatosplenomegaly MSK: extremities are warm, no edema.  Skin: warm, no rash Neuro:  no focal deficits Psych: appropriate affect   Diagnostic Studies  01/2013 Echo Study Conclusions  - Left ventricle: Systolic function is low normal, with  an estimated EF of 50-55%, but closer to 50%. There is mild global hypokinesis. The cavity size was normal. Wall thickness was increased in a pattern of mild LVH. There was an increased relative contribution of atrial contraction to ventricular filling. Doppler parameters are consistent with abnormal left ventricular relaxation (grade 1 diastolic dysfunction). - Ventricular septum: Motion consistent with post-operative changes. The contour showed diastolic flattening. - Left atrium: The atrium was mildly dilated.   09/2012 ETT Exc 6 min 50 sec, 10.1 METs. No specific ischemic changes, frequent PVCs and PACs, 3 beat run of NSVT.   06/2016 Nuclear stress test  ECG nondiagnostic due to IVCD. No arrhythmias noted during exercise.  Blood pressure demonstrated a hypertensive response to exercise.  Medium, moderate intensity, inferior/inferolateral defect extended from apex to base. There is a partially reversible zone in the inferolateral wall at the base. Most suggestive of scar with mild peri-infarct ischemia.  This is an intermediate risk study.  Nuclear stress EF: 40%. Consider confirmation with echocardiography.   07/2016 echo Study Conclusions  - Left ventricle: The cavity size was at the upper limits of normal. Wall thickness was increased in a pattern of mild LVH. The estimated ejection fraction was 40%. Diffuse hypokinesis. There is akinesis of the basalinferior myocardium. Doppler parameters are consistent with abnormal left ventricular relaxation (grade 1 diastolic dysfunction). - Aortic valve: Mildly calcified annulus. Trileaflet. - Mitral valve: Mildly thickened leaflets . There was mild regurgitation. - Left atrium: The atrium was mildly dilated. - Tricuspid valve: There was trivial regurgitation. - Pulmonary arteries: Systolic pressure could not be accurately estimated. - Pericardium, extracardiac: There was no pericardial  effusion.  Impressions:  - Mild LVH with upper normal chamber size and LVEF approximately 40%. There is diffuse hypokinesis with inferior basal akinesis. Probable grade 1 diastolic dysfunction. Mild left atrial enlargement. Mildly thickened mitral leaflets with mild mitral regurgitation.   08/2016 cath  Severe native three-vessel coronary artery disease.  Mid RCA lesion, 100 %stenosed. SVG to PDA is occluded. There are right to right and left to right collaterals.  Mid LAD lesion, 100 %stenosed. LIMA to LAD is atretic. LAD fills from flow from the SVG to diagonal filling the LAD.  Ost 2nd Mrg to 2nd Mrg lesion, 99 %stenosed. SVG to OM2 is occluded.  Ost 1st Mrg to 1st Mrg lesion, 80 %stenosed. Free RIMA graft to OM 1 is occluded. A STENT PROMUS PREM MR 3.5X28 drug eluting stent was successfully placed., postdilated to > 4mm.  Post intervention, there is a 0% residual stenosis.  Mid Cx lesion, 80 %stenosed. THis was treated with initial cutting balloon angioplasty with a 2.5 mm balloon prior to stent placement in the OM. Then after stent placement, PTCA was done with a 2.0 balloon.  Post intervention, there is a 10% residual stenosis.  The left ventricular ejection fraction is 25-35% by visual estimate.  LV end diastolic pressure is moderately elevated.  There is moderate to severe left ventricular systolic dysfunction.  There is no aortic valve stenosis.  Severe coronary artery disease. Severe graft disease. Only patent graft is the SVG to diagonal which feeds both the diagonal and the LAD. Successful stenting of a large OM1 and balloon angioplasty of the mid circumflex. The RCA territory fills by right to right and left to right collaterals. Continue aggressive secondary prevention. He'll need dual antiplatelet therapy for at least a year. Consider clopidogrel longer-term given the extent of his disease.    Assessment and Plan  1. CAD/Chronic systolic HF/ICM -  no recent symptoms, continue current meds - ekg in clinic today shows SR, nonspecific conduction delay  2. Hyperlipidemia -request labs from pcp - continue current statin  3. HTN - elevated in clinic, he reports bp was at goal at recent pcp visit - given info on DASH diet, discussed exercise, sodium limitation,and weight loss - continue to monitor bp at this time, no med changes  F/h 6 months  Antoine Poche, M.D.

## 2017-06-30 NOTE — Patient Instructions (Signed)
Your physician wants you to follow-up in: 6 MONTHS WITH DR [facility] You will receive a reminder letter in the mail two months in advance. If you don't receive a letter, please call our office to schedule the follow-up appointment.  Your physician recommends that you continue on your current medications as directed. Please refer to the Current Medication list given to you today.   DASH Eating Plan DASH stands for "Dietary Approaches to Stop Hypertension." The DASH eating plan is a healthy eating plan that has been shown to reduce high blood pressure (hypertension). It may also reduce your risk for type 2 diabetes, heart disease, and stroke. The DASH eating plan may also help with weight loss. What are tips for following this plan? General guidelines  Avoid eating more than 2,300 mg (milligrams) of salt (sodium) a day. If you have hypertension, you may need to reduce your sodium intake to 1,500 mg a day.  Limit alcohol intake to no more than 1 drink a day for nonpregnant women and 2 drinks a day for men. One drink equals 12 oz of beer, 5 oz of wine, or 1 oz of hard liquor.  Work with your health care provider to maintain a healthy body weight or to lose weight. Ask what an ideal weight is for you.  Get at least 30 minutes of exercise that causes your heart to beat faster (aerobic exercise) most days of the week. Activities may include walking, swimming, or biking.  Work with your health care provider or diet and nutrition specialist (dietitian) to adjust your eating plan to your individual calorie needs. Reading food labels  Check food labels for the amount of sodium per serving. Choose foods with less than 5 percent of the Daily Value of sodium. Generally, foods with less than 300 mg of sodium per serving fit into this eating plan.  To find whole grains, look for the word "whole" as the first word in the ingredient list. Shopping  Buy products labeled as "low-sodium" or "no salt  added."  Buy fresh foods. Avoid canned foods and premade or frozen meals. Cooking  Avoid adding salt when cooking. Use salt-free seasonings or herbs instead of table salt or sea salt. Check with your health care provider or pharmacist before using salt substitutes.  Do not fry foods. Cook foods using healthy methods such as baking, boiling, grilling, and broiling instead.  Cook with heart-healthy oils, such as olive, canola, soybean, or sunflower oil. Meal planning   Eat a balanced diet that includes: ? 5 or more servings of fruits and vegetables each day. At each meal, try to fill half of your plate with fruits and vegetables. ? Up to 6-8 servings of whole grains each day. ? Less than 6 oz of lean meat, poultry, or fish each day. A 3-oz serving of meat is about the same size as a deck of cards. One egg equals 1 oz. ? 2 servings of low-fat dairy each day. ? A serving of nuts, seeds, or beans 5 times each week. ? Heart-healthy fats. Healthy fats called Omega-3 fatty acids are found in foods such as flaxseeds and coldwater fish, like sardines, salmon, and mackerel.  Limit how much you eat of the following: ? Canned or prepackaged foods. ? Food that is high in trans fat, such as fried foods. ? Food that is high in saturated fat, such as fatty meat. ? Sweets, desserts, sugary drinks, and other foods with added sugar. ? Full-fat dairy products.  Do not  salt foods before eating.  Try to eat at least 2 vegetarian meals each week.  Eat more home-cooked food and less restaurant, buffet, and fast food.  When eating at a restaurant, ask that your food be prepared with less salt or no salt, if possible. What foods are recommended? The items listed may not be a complete list. Talk with your dietitian about what dietary choices are best for you. Grains Whole-grain or whole-wheat bread. Whole-grain or whole-wheat pasta. Brown rice. Orpah Cobb. Bulgur. Whole-grain and low-sodium cereals.  Pita bread. Low-fat, low-sodium crackers. Whole-wheat flour tortillas. Vegetables Fresh or frozen vegetables (raw, steamed, roasted, or grilled). Low-sodium or reduced-sodium tomato and vegetable juice. Low-sodium or reduced-sodium tomato sauce and tomato paste. Low-sodium or reduced-sodium canned vegetables. Fruits All fresh, dried, or frozen fruit. Canned fruit in natural juice (without added sugar). Meat and other protein foods Skinless chicken or [place]. Ground chicken or [place]. Pork with fat trimmed off. Fish and seafood. Egg whites. Dried beans, peas, or lentils. Unsalted nuts, nut butters, and seeds. Unsalted canned beans. Lean cuts of beef with fat trimmed off. Low-sodium, lean deli meat. Dairy Low-fat (1%) or fat-free (skim) milk. Fat-free, low-fat, or reduced-fat cheeses. Nonfat, low-sodium ricotta or cottage cheese. Low-fat or nonfat yogurt. Low-fat, low-sodium cheese. Fats and oils Soft margarine without trans fats. Vegetable oil. Low-fat, reduced-fat, or light mayonnaise and salad dressings (reduced-sodium). Canola, safflower, olive, soybean, and sunflower oils. Avocado. Seasoning and other foods Herbs. Spices. Seasoning mixes without salt. Unsalted popcorn and pretzels. Fat-free sweets. What foods are not recommended? The items listed may not be a complete list. Talk with your dietitian about what dietary choices are best for you. Grains Baked goods made with fat, such as croissants, muffins, or some breads. Dry pasta or rice meal packs. Vegetables Creamed or fried vegetables. Vegetables in a cheese sauce. Regular canned vegetables (not low-sodium or reduced-sodium). Regular canned tomato sauce and paste (not low-sodium or reduced-sodium). Regular tomato and vegetable juice (not low-sodium or reduced-sodium). Rosita Fire. Olives. Fruits Canned fruit in a light or heavy syrup. Fried fruit. Fruit in cream or butter sauce. Meat and other protein foods Fatty cuts of meat. Ribs. Fried  meat. Tomasa Blase. Sausage. Bologna and other processed lunch meats. Salami. Fatback. Hotdogs. Bratwurst. Salted nuts and seeds. Canned beans with added salt. Canned or smoked fish. Whole eggs or egg yolks. Chicken or [place] with skin. Dairy Whole or 2% milk, cream, and half-and-half. Whole or full-fat cream cheese. Whole-fat or sweetened yogurt. Full-fat cheese. Nondairy creamers. Whipped toppings. Processed cheese and cheese spreads. Fats and oils Butter. Stick margarine. Lard. Shortening. Ghee. Bacon fat. Tropical oils, such as coconut, palm kernel, or palm oil. Seasoning and other foods Salted popcorn and pretzels. Onion salt, garlic salt, seasoned salt, table salt, and sea salt. Worcestershire sauce. Tartar sauce. Barbecue sauce. Teriyaki sauce. Soy sauce, including reduced-sodium. Steak sauce. Canned and packaged gravies. Fish sauce. Oyster sauce. Cocktail sauce. Horseradish that you find on the shelf. Ketchup. Mustard. Meat flavorings and tenderizers. Bouillon cubes. Hot sauce and Tabasco sauce. Premade or packaged marinades. Premade or packaged taco seasonings. Relishes. Regular salad dressings. Where to find more information:  National Heart, Lung, and Blood Institute: PopSteam.is  American Heart Association: www.heart.org Summary  The DASH eating plan is a healthy eating plan that has been shown to reduce high blood pressure (hypertension). It may also reduce your risk for type 2 diabetes, heart disease, and stroke.  With the DASH eating plan, you should limit salt (sodium) intake to  2,300 mg a day. If you have hypertension, you may need to reduce your sodium intake to 1,500 mg a day.  When on the DASH eating plan, aim to eat more fresh fruits and vegetables, whole grains, lean proteins, low-fat dairy, and heart-healthy fats.  Work with your health care provider or diet and nutrition specialist (dietitian) to adjust your eating plan to your individual calorie needs. This information is  not intended to replace advice given to you by your health care provider. Make sure you discuss any questions you have with your health care provider. Document Released: 04/11/2011 Document Revised: 04/15/2016 Document Reviewed: 04/15/2016 Elsevier Interactive Patient Education  [employer]2018 Else[employer] Inc.

## 2017-07-04 ENCOUNTER — Encounter: Payer: Self-pay | Admitting: Cardiology

## 2017-07-07 ENCOUNTER — Other Ambulatory Visit: Payer: Self-pay | Admitting: Cardiology

## 2017-08-22 ENCOUNTER — Other Ambulatory Visit: Payer: Self-pay | Admitting: Cardiology

## 2017-10-28 DIAGNOSIS — C44729 Squamous cell carcinoma of skin of left lower limb, including hip: Secondary | ICD-10-CM | POA: Diagnosis not present

## 2017-10-28 DIAGNOSIS — L570 Actinic keratosis: Secondary | ICD-10-CM | POA: Diagnosis not present

## 2017-11-05 DIAGNOSIS — C44729 Squamous cell carcinoma of skin of left lower limb, including hip: Secondary | ICD-10-CM | POA: Diagnosis not present

## 2017-12-04 DIAGNOSIS — Z950 Presence of cardiac pacemaker: Secondary | ICD-10-CM

## 2017-12-04 HISTORY — DX: Presence of cardiac pacemaker: Z95.0

## 2017-12-23 DIAGNOSIS — I5021 Acute systolic (congestive) heart failure: Secondary | ICD-10-CM | POA: Diagnosis not present

## 2017-12-23 DIAGNOSIS — E785 Hyperlipidemia, unspecified: Secondary | ICD-10-CM | POA: Diagnosis not present

## 2017-12-23 DIAGNOSIS — Z955 Presence of coronary angioplasty implant and graft: Secondary | ICD-10-CM | POA: Diagnosis not present

## 2017-12-23 DIAGNOSIS — I255 Ischemic cardiomyopathy: Secondary | ICD-10-CM | POA: Diagnosis not present

## 2017-12-23 DIAGNOSIS — R0602 Shortness of breath: Secondary | ICD-10-CM | POA: Diagnosis not present

## 2017-12-23 DIAGNOSIS — R001 Bradycardia, unspecified: Secondary | ICD-10-CM | POA: Diagnosis not present

## 2017-12-23 DIAGNOSIS — I712 Thoracic aortic aneurysm, without rupture: Secondary | ICD-10-CM | POA: Diagnosis not present

## 2017-12-23 DIAGNOSIS — Z951 Presence of aortocoronary bypass graft: Secondary | ICD-10-CM | POA: Diagnosis not present

## 2017-12-23 DIAGNOSIS — R0600 Dyspnea, unspecified: Secondary | ICD-10-CM | POA: Diagnosis not present

## 2017-12-23 DIAGNOSIS — I4891 Unspecified atrial fibrillation: Secondary | ICD-10-CM | POA: Diagnosis not present

## 2017-12-23 DIAGNOSIS — I2510 Atherosclerotic heart disease of native coronary artery without angina pectoris: Secondary | ICD-10-CM | POA: Diagnosis not present

## 2017-12-23 DIAGNOSIS — I4430 Unspecified atrioventricular block: Secondary | ICD-10-CM | POA: Diagnosis not present

## 2017-12-23 DIAGNOSIS — F5104 Psychophysiologic insomnia: Secondary | ICD-10-CM | POA: Diagnosis not present

## 2017-12-23 DIAGNOSIS — R200 Anesthesia of skin: Secondary | ICD-10-CM | POA: Diagnosis not present

## 2017-12-23 DIAGNOSIS — W03XXXA Other fall on same level due to collision with another person, initial encounter: Secondary | ICD-10-CM | POA: Diagnosis not present

## 2017-12-23 DIAGNOSIS — I509 Heart failure, unspecified: Secondary | ICD-10-CM | POA: Diagnosis not present

## 2017-12-23 DIAGNOSIS — M79662 Pain in left lower leg: Secondary | ICD-10-CM | POA: Diagnosis not present

## 2017-12-25 ENCOUNTER — Inpatient Hospital Stay (HOSPITAL_COMMUNITY)
Admission: AD | Admit: 2017-12-25 | Discharge: 2017-12-26 | DRG: 242 | Disposition: A | Discharge status: Home / Self Care | Payer: Medicare PPO | Source: Other Acute Inpatient Hospital | Attending: Cardiology | Admitting: Cardiology

## 2017-12-25 ENCOUNTER — Encounter (HOSPITAL_COMMUNITY)
Admission: AD | Disposition: A | Discharge status: Home / Self Care | Payer: Self-pay | Source: Other Acute Inpatient Hospital | Attending: Cardiology

## 2017-12-25 DIAGNOSIS — I4891 Unspecified atrial fibrillation: Secondary | ICD-10-CM | POA: Diagnosis present

## 2017-12-25 DIAGNOSIS — I442 Atrioventricular block, complete: Secondary | ICD-10-CM | POA: Diagnosis not present

## 2017-12-25 DIAGNOSIS — Z823 Family history of stroke: Secondary | ICD-10-CM

## 2017-12-25 DIAGNOSIS — I5023 Acute on chronic systolic (congestive) heart failure: Secondary | ICD-10-CM

## 2017-12-25 DIAGNOSIS — Z7902 Long term (current) use of antithrombotics/antiplatelets: Secondary | ICD-10-CM | POA: Diagnosis not present

## 2017-12-25 DIAGNOSIS — Z9049 Acquired absence of other specified parts of digestive tract: Secondary | ICD-10-CM

## 2017-12-25 DIAGNOSIS — Z9849 Cataract extraction status, unspecified eye: Secondary | ICD-10-CM

## 2017-12-25 DIAGNOSIS — Z820 Family history of epilepsy and other diseases of the nervous system: Secondary | ICD-10-CM | POA: Diagnosis not present

## 2017-12-25 DIAGNOSIS — I447 Left bundle-branch block, unspecified: Secondary | ICD-10-CM | POA: Diagnosis present

## 2017-12-25 DIAGNOSIS — Z950 Presence of cardiac pacemaker: Secondary | ICD-10-CM

## 2017-12-25 DIAGNOSIS — I252 Old myocardial infarction: Secondary | ICD-10-CM

## 2017-12-25 DIAGNOSIS — Z7982 Long term (current) use of aspirin: Secondary | ICD-10-CM

## 2017-12-25 DIAGNOSIS — I481 Persistent atrial fibrillation: Secondary | ICD-10-CM

## 2017-12-25 DIAGNOSIS — I255 Ischemic cardiomyopathy: Secondary | ICD-10-CM

## 2017-12-25 DIAGNOSIS — Z886 Allergy status to analgesic agent status: Secondary | ICD-10-CM | POA: Diagnosis not present

## 2017-12-25 DIAGNOSIS — E782 Mixed hyperlipidemia: Secondary | ICD-10-CM | POA: Diagnosis present

## 2017-12-25 DIAGNOSIS — I2510 Atherosclerotic heart disease of native coronary artery without angina pectoris: Secondary | ICD-10-CM | POA: Diagnosis present

## 2017-12-25 DIAGNOSIS — I482 Chronic atrial fibrillation: Secondary | ICD-10-CM

## 2017-12-25 DIAGNOSIS — I110 Hypertensive heart disease with heart failure: Secondary | ICD-10-CM | POA: Diagnosis present

## 2017-12-25 DIAGNOSIS — R0902 Hypoxemia: Secondary | ICD-10-CM | POA: Diagnosis present

## 2017-12-25 DIAGNOSIS — Z951 Presence of aortocoronary bypass graft: Secondary | ICD-10-CM | POA: Diagnosis not present

## 2017-12-25 DIAGNOSIS — R001 Bradycardia, unspecified: Principal | ICD-10-CM | POA: Diagnosis present

## 2017-12-25 DIAGNOSIS — I712 Thoracic aortic aneurysm, without rupture: Secondary | ICD-10-CM | POA: Diagnosis not present

## 2017-12-25 DIAGNOSIS — I5022 Chronic systolic (congestive) heart failure: Secondary | ICD-10-CM | POA: Diagnosis not present

## 2017-12-25 DIAGNOSIS — Z808 Family history of malignant neoplasm of other organs or systems: Secondary | ICD-10-CM

## 2017-12-25 DIAGNOSIS — I5043 Acute on chronic combined systolic (congestive) and diastolic (congestive) heart failure: Secondary | ICD-10-CM | POA: Diagnosis not present

## 2017-12-25 DIAGNOSIS — R0989 Other specified symptoms and signs involving the circulatory and respiratory systems: Secondary | ICD-10-CM | POA: Diagnosis not present

## 2017-12-25 HISTORY — PX: BIV PACEMAKER INSERTION CRT-P: EP1199

## 2017-12-25 LAB — BASIC METABOLIC PANEL
Anion gap: 6 (ref 5–15)
BUN: 23 mg/dL (ref 8–23)
CALCIUM: 9.6 mg/dL (ref 8.9–10.3)
CO2: 35 mmol/L — AB (ref 22–32)
CREATININE: 1.24 mg/dL (ref 0.61–1.24)
Chloride: 100 mmol/L (ref 98–111)
GFR calc non Af Amer: 59 mL/min — ABNORMAL LOW (ref >60)
Glucose, Bld: 113 mg/dL — ABNORMAL HIGH (ref 70–99)
Potassium: 3.8 mmol/L (ref 3.5–5.1)
Sodium: 141 mmol/L (ref 135–145)

## 2017-12-25 LAB — CBC
HEMATOCRIT: 46.5 % (ref 39.0–52.0)
Hemoglobin: 14.7 g/dL (ref 13.0–17.0)
MCH: 29.2 pg (ref 26.0–34.0)
MCHC: 31.6 g/dL (ref 30.0–36.0)
MCV: 92.4 fL (ref 78.0–100.0)
Platelets: 200 10*3/uL (ref 150–400)
RBC: 5.03 MIL/uL (ref 4.22–5.81)
RDW: 12.5 % (ref 11.5–15.5)
WBC: 9.8 10*3/uL (ref 4.0–10.5)

## 2017-12-25 LAB — MRSA PCR SCREENING: MRSA BY PCR: NEGATIVE

## 2017-12-25 LAB — SURGICAL PCR SCREEN
MRSA, PCR: NEGATIVE
Staphylococcus aureus: NEGATIVE

## 2017-12-25 LAB — HIV ANTIBODY (ROUTINE TESTING W REFLEX): HIV Screen 4th Generation wRfx: NONREACTIVE

## 2017-12-25 SURGERY — BIV PACEMAKER INSERTION CRT-P

## 2017-12-25 MED ORDER — YOU HAVE A PACEMAKER BOOK
Freq: Once | Status: AC
  Administered 2017-12-25: 21:00:00
  Filled 2017-12-25: qty 1

## 2017-12-25 MED ORDER — CHLORHEXIDINE GLUCONATE 4 % EX LIQD
60.0000 mL | Freq: Once | CUTANEOUS | Status: DC
  Administered 2017-12-25: 4 via TOPICAL

## 2017-12-25 MED ORDER — ONDANSETRON HCL 4 MG/2ML IJ SOLN: 4.0000 mg | Freq: Four times a day (QID) | INTRAMUSCULAR | Status: DC | PRN

## 2017-12-25 MED ORDER — FENTANYL CITRATE (PF) 100 MCG/2ML IJ SOLN: 25.0000 ug | INTRAMUSCULAR | Status: DC | PRN

## 2017-12-25 MED ORDER — HEPARIN (PORCINE) IN NACL 1000-0.9 UT/500ML-% IV SOLN
INTRAVENOUS | Status: DC | PRN
  Administered 2017-12-25: 500 mL

## 2017-12-25 MED ORDER — CHLORHEXIDINE GLUCONATE 4 % EX LIQD: 60.0000 mL | Freq: Once | CUTANEOUS | Status: AC

## 2017-12-25 MED ORDER — MIDAZOLAM HCL 5 MG/5ML IJ SOLN
INTRAMUSCULAR | Status: AC
  Filled 2017-12-25: qty 5

## 2017-12-25 MED ORDER — CEFAZOLIN SODIUM-DEXTROSE 1-4 GM/50ML-% IV SOLN
1.0000 g | Freq: Four times a day (QID) | INTRAVENOUS | Status: AC
  Administered 2017-12-25 – 2017-12-26 (×3): 1 g via INTRAVENOUS
  Filled 2017-12-25 (×3): qty 50

## 2017-12-25 MED ORDER — IOPAMIDOL (ISOVUE-370) INJECTION 76%
INTRAVENOUS | Status: AC
  Filled 2017-12-25: qty 50

## 2017-12-25 MED ORDER — SODIUM CHLORIDE 0.9 % IV SOLN
INTRAVENOUS | Status: AC
  Filled 2017-12-25: qty 2

## 2017-12-25 MED ORDER — CLOPIDOGREL BISULFATE 75 MG PO TABS
75.0000 mg | ORAL_TABLET | Freq: Every day | ORAL | Status: DC
  Administered 2017-12-25 – 2017-12-26 (×2): 75 mg via ORAL
  Filled 2017-12-25 (×2): qty 1

## 2017-12-25 MED ORDER — FENTANYL CITRATE (PF) 100 MCG/2ML IJ SOLN
INTRAMUSCULAR | Status: AC
  Filled 2017-12-25: qty 2

## 2017-12-25 MED ORDER — CHLORHEXIDINE GLUCONATE 4 % EX LIQD
Freq: Once | CUTANEOUS | Status: AC
  Administered 2017-12-25: 12:00:00 via TOPICAL
  Filled 2017-12-25: qty 60

## 2017-12-25 MED ORDER — LIDOCAINE HCL (PF) 1 % IJ SOLN
INTRAMUSCULAR | Status: DC | PRN
  Administered 2017-12-25: 40 mL

## 2017-12-25 MED ORDER — SODIUM CHLORIDE 0.9 % IV SOLN
80.0000 mg | INTRAVENOUS | Status: AC
  Administered 2017-12-25: 80 mg
  Filled 2017-12-25: qty 2

## 2017-12-25 MED ORDER — LISINOPRIL 10 MG PO TABS
10.0000 mg | ORAL_TABLET | Freq: Every day | ORAL | Status: DC
  Administered 2017-12-25 – 2017-12-26 (×2): 10 mg via ORAL
  Filled 2017-12-25 (×2): qty 1

## 2017-12-25 MED ORDER — FUROSEMIDE 10 MG/ML IJ SOLN
40.0000 mg | Freq: Once | INTRAMUSCULAR | Status: AC
  Administered 2017-12-25: 40 mg via INTRAVENOUS
  Filled 2017-12-25: qty 4

## 2017-12-25 MED ORDER — HEPARIN (PORCINE) IN NACL 1000-0.9 UT/500ML-% IV SOLN
INTRAVENOUS | Status: AC
  Filled 2017-12-25: qty 500

## 2017-12-25 MED ORDER — ASPIRIN EC 81 MG PO TBEC
81.0000 mg | DELAYED_RELEASE_TABLET | Freq: Every day | ORAL | Status: DC
  Administered 2017-12-25 – 2017-12-26 (×2): 81 mg via ORAL
  Filled 2017-12-25 (×2): qty 1

## 2017-12-25 MED ORDER — SODIUM CHLORIDE 0.9 % IV SOLN
INTRAVENOUS | Status: DC | PRN
  Administered 2017-12-25 – 2017-12-26 (×2): 250 mL via INTRAVENOUS

## 2017-12-25 MED ORDER — MIDAZOLAM HCL 5 MG/5ML IJ SOLN
INTRAMUSCULAR | Status: DC | PRN
  Administered 2017-12-25: 1 mg via INTRAVENOUS
  Administered 2017-12-25: 2 mg via INTRAVENOUS

## 2017-12-25 MED ORDER — PANTOPRAZOLE SODIUM 40 MG PO TBEC
40.0000 mg | DELAYED_RELEASE_TABLET | Freq: Every day | ORAL | Status: DC
  Administered 2017-12-25 – 2017-12-26 (×2): 40 mg via ORAL
  Filled 2017-12-25 (×2): qty 1

## 2017-12-25 MED ORDER — CEFAZOLIN SODIUM-DEXTROSE 2-4 GM/100ML-% IV SOLN
INTRAVENOUS | Status: AC
  Filled 2017-12-25: qty 100

## 2017-12-25 MED ORDER — ADULT MULTIVITAMIN W/MINERALS CH
1.0000 | ORAL_TABLET | Freq: Every day | ORAL | Status: DC
  Administered 2017-12-25 – 2017-12-26 (×2): 1 via ORAL
  Filled 2017-12-25 (×2): qty 1

## 2017-12-25 MED ORDER — CEFAZOLIN SODIUM-DEXTROSE 2-4 GM/100ML-% IV SOLN
2.0000 g | INTRAVENOUS | Status: AC
  Administered 2017-12-25: 2 g via INTRAVENOUS
  Filled 2017-12-25: qty 100

## 2017-12-25 MED ORDER — LIDOCAINE HCL (PF) 1 % IJ SOLN
INTRAMUSCULAR | Status: AC
  Filled 2017-12-25: qty 30

## 2017-12-25 MED ORDER — SODIUM CHLORIDE 0.9 % IV SOLN: INTRAVENOUS | Status: DC

## 2017-12-25 MED ORDER — FENTANYL CITRATE (PF) 100 MCG/2ML IJ SOLN
INTRAMUSCULAR | Status: DC | PRN
  Administered 2017-12-25: 25 ug via INTRAVENOUS
  Administered 2017-12-25: 12.5 ug via INTRAVENOUS

## 2017-12-25 MED ORDER — ATORVASTATIN CALCIUM 80 MG PO TABS
80.0000 mg | ORAL_TABLET | Freq: Every day | ORAL | Status: DC
  Administered 2017-12-25: 80 mg via ORAL
  Filled 2017-12-25: qty 1

## 2017-12-25 MED ORDER — IOPAMIDOL (ISOVUE-370) INJECTION 76%
INTRAVENOUS | Status: DC | PRN
  Administered 2017-12-25: 10 mL via INTRAVENOUS

## 2017-12-25 SURGICAL SUPPLY — 16 items
CABLE SURGICAL S-101-97-12 (CABLE) ×3 IMPLANT
CATH CPS DIRECT 135 DS2C020 (CATHETERS) ×3 IMPLANT
CATH HEX JOS 2-5-2 65CM 6F REP (CATHETERS) ×3 IMPLANT
CPS IMPLANT KIT 410190 (MISCELLANEOUS) ×3 IMPLANT
LEAD QUARTET 1456Q-86 (Lead) ×1 IMPLANT
LEAD TENDRIL MRI 52CM LPA1200M (Lead) ×3 IMPLANT
LEAD TENDRIL MRI 58CM LPA1200M (Lead) ×3 IMPLANT
PACEMAKER QUDR ALLR CRT PM3562 (Pacemaker) ×1 IMPLANT
PAD PRO RADIOLUCENT 2001M-C (PAD) ×3 IMPLANT
PMKR QUADRA ALLURE CRT PM3562 (Pacemaker) ×3 IMPLANT
QUARTET 1456Q-86 (Lead) ×3 IMPLANT
SHEATH CLASSIC 8F (SHEATH) ×6 IMPLANT
SHEATH WORLEY 9FR 62CM (SHEATH) ×3 IMPLANT
TRAY PACEMAKER INSERTION (PACKS) ×3 IMPLANT
WIRE ACUITY WHISPER EDS 4648 (WIRE) ×3 IMPLANT
WIRE MAILMAN 182CM (WIRE) ×3 IMPLANT

## 2017-12-25 NOTE — Discharge Instructions (Signed)
° ° °  Supplemental Discharge Instructions for  °Pacemaker/Defibrillator Patients ° °Activity °No heavy lifting or vigorous activity with your left/right arm for 6 to 8 weeks.  Do not raise your left/right arm above your head for one week.  Gradually raise your affected arm as drawn below. ° °        ° °__         12/29/17                 12/30/17                     12/31/17                   01/01/18 ° °NO DRIVING for   1 week  ; you may begin driving on   01/01/18  . ° °WOUND CARE °- Keep the wound area clean and dry.  Do not get this area wet for one week. No showers for one week; you may shower on   01/01/18  . °- The tape/steri-strips on your wound will fall off; do not pull them off.  No bandage is needed on the site.  DO  NOT apply any creams, oils, or ointments to the wound area. °- If you notice any drainage or discharge from the wound, any swelling or bruising at the site, or you develop a fever > 101? F after you are discharged home, call the office at once. ° °Special Instructions °- You are still able to use cellular telephones; use the ear opposite the side where you have your pacemaker/defibrillator.  Avoid carrying your cellular phone near your device. °- When traveling through airports, show security personnel your identification card to avoid being screened in the metal detectors.  Ask the security personnel to use the hand wand. °- Avoid arc welding equipment, MRI testing (magnetic resonance imaging), TENS units (transcutaneous nerve stimulators).  Call the office for questions about other devices. °- Avoid electrical appliances that are in poor condition or are not properly grounded. °- Microwave ovens are safe to be near or to operate. °  °

## 2017-12-25 NOTE — Progress Notes (Signed)
Cards Fellow paged regarding new patient admit. Patient asymptomatic with HR in the 30-40s. Will continue to monitor patient.

## 2017-12-25 NOTE — H&P (Signed)
Cardiology History & Physical    Patient ID: Michael Melton MRN: [id number], DOB: 09/26/1950 Date of Encounter: 12/25/2017, 2:36 AM Primary Physician: Estanislado Pandy, MD  Chief Complaint: AF, bradycardia   HPI: Michael Melton is a 67 y.o. male with history of severe coronary artery disease status post CABG, ischemic cardiomyopathy (EF 35 to 40%), hypertension, hyperlipidemia, who presents as a transfer from [facility] for management of significant asymptomatic bradycardia and decompensated heart failure.  Overall, the patient has been doing reasonably well.  He has had some subacute shortness of breath, and has noted some dietary indiscretions (eating some high sodium foods recently).  2 days prior to admission, he was in the shower when he noticed some numbness in his left lower leg.  He presented to the emergency department for evaluation of this.  There head CT was within normal limits.  He had a CT PE, which showed no evidence of acute PE, but also an ascending thoracic aortic aneurysm of 4.6 cm.  He had no evidence of a DVT on left lower extremity Dopplers.  There he was also found to be significantly bradycardic, with heart rates in the 30s to 40s in atrial fibrillation.  He was hypoxemic, which was presumed secondary to acute decompensated heart failure.  He was admitted to the ICU at [facility], while awaiting a bed at [facility].  There he was diuresed with IV furosemide with improvement in his oxygenation and subjective improvement in his shortness of breath.  His beta-blocker was held, however this did not significantly improve his heart rates.  He was then sent to [facility] for consideration of pacemaker implantation.  On my interview, the patient feels well.  His shortness of breath is significantly improved.  At no point recently did he have any chest pain, palpitations, syncope or presyncopal symptoms.  While sitting up in the bed, his heart rate was running in the low 30s  on telemetry, and had no symptoms whatsoever with this.  Past Medical History:  Diagnosis Date  . CAD in native artery    08/05/16 Cath, atresia of LIMA to LAD, occlusion of SVG-->RCA, SVG-->Circumflex/OM2, SVG-->OM1, patent SVG to diag, DES to native circumflex.   . Erectile dysfunction   . Hyperlipidemia, mixed   . Myocardial infarction [facility])    Out of hospital myocardial infarction/inferior wall, status post Cardiolite study 2007.  Ejection fraction 45%  . Unspecified essential hypertension      Surgical History:  Past Surgical History:  Procedure Laterality Date  . CATARACT EXTRACTION    . CORONARY ARTERY BYPASS GRAFT     2001.  LIMA to LAD, SVG to OM1, SVG to OM 2, SVG to diagonal, SVG to PDA.  Marland Kitchen CORONARY BALLOON ANGIOPLASTY N/A 08/05/2016   Procedure: Coronary Balloon Angioplasty;  Surgeon: Corky Crafts, MD;  Location: [facility] INVASIVE CV LAB;  Service: Cardiovascular;  Laterality: N/A;  CFX  . CORONARY STENT INTERVENTION N/A 08/05/2016   Procedure: Coronary Stent Intervention;  Surgeon: Corky Crafts, MD;  Location: [facility] INVASIVE CV LAB;  Service: Cardiovascular;  Laterality: N/A;  OM  . LAPAROSCOPIC CHOLECYSTECTOMY  06/22/2013   Dr. Cristy Folks  . LEFT HEART CATH AND CORS/GRAFTS ANGIOGRAPHY N/A 08/05/2016   Procedure: Left Heart Cath and Cors/Grafts Angiography;  Surgeon: Corky Crafts, MD;  Location: [facility] INVASIVE CV LAB;  Service: Cardiovascular;  Laterality: N/A;     Home Meds: Prior to Admission medications   Medication Sig Start Date End Date  Taking? Authorizing Provider  aspirin 81 MG tablet Take 81 mg by mouth daily.      [provider]  atorvastatin (LIPITOR) 40 MG tablet Take 40 mg by mouth daily.    [provider]  atorvastatin (LIPITOR) 80 MG tablet TAKE 1 TABLET (80 MG TOTAL) BY MOUTH DAILY. 07/07/17   Antoine Poche, MD  clopidogrel (PLAVIX) 75 MG tablet Take 1 tablet (75 mg total) by mouth daily with breakfast. 08/06/16   Laverda Page B,  NP  lisinopril (PRINIVIL,ZESTRIL) 10 MG tablet TAKE 1 TABLET BY MOUTH EVERY DAY 08/05/16   Antoine Poche, MD  metoprolol succinate (TOPROL-XL) 100 MG 24 hr tablet TAKE 1 TABLET (100 MG TOTAL) BY MOUTH DAILY. TAKE WITH OR IMMEDIATELY FOLLOWING A MEAL. 08/22/17 11/20/17  Antoine Poche, MD  Multiple Vitamin (MULTIVITAMIN) tablet Take 1 tablet by mouth daily. Doesn't take this everyday    [provider]  nitroGLYCERIN (NITROSTAT) 0.4 MG SL tablet Place 0.4 mg under the tongue every 5 (five) minutes as needed.     [provider]  pantoprazole (PROTONIX) 40 MG tablet Take 1 tablet (40 mg total) by mouth daily. 08/06/16   Arty Baumgartner, NP    Allergies:  Allergies  Allergen Reactions  . Acetaminophen     REACTION: hallucinations    Social History   Socioeconomic History  . Marital status: Married    Spouse name: Not on file  . Number of children: Not on file  . Years of education: Not on file  . Highest education level: Not on file  Occupational History  . Occupation: Full time    Employer: [employer]  Social Needs  . Financial resource strain: Not on file  . Food insecurity:    Worry: Not on file    Inability: Not on file  . Transportation needs:    Medical: Not on file    Non-medical: Not on file  Tobacco Use  . Smoking status: Never Smoker  . Smokeless tobacco: Never Used  Substance and Sexual Activity  . Alcohol use: Yes    Alcohol/week: 0.0 standard drinks    Comment: RARE  . Drug use: No  . Sexual activity: Not on file  Lifestyle  . Physical activity:    Days per week: Not on file    Minutes per session: Not on file  . Stress: Not on file  Relationships  . Social connections:    Talks on phone: Not on file    Gets together: Not on file    Attends religious service: Not on file    Active member of club or organization: Not on file    Attends meetings of clubs or organizations: Not on file    Relationship status: Not on file  .  Intimate partner violence:    Fear of current or ex partner: Not on file    Emotionally abused: Not on file    Physically abused: Not on file    Forced sexual activity: Not on file  Other Topics Concern  . Not on file  Social History Narrative  . Not on file     Family History  Problem Relation Age of Onset  . Alzheimer's disease Mother   . Brain cancer Mother        brain tumor  . Stroke Father   . Alzheimer's disease Father     Review of Systems: All other systems reviewed and are otherwise negative except as noted above.  Labs:  Lab Results  Component Value Date   WBC 9.8 08/06/2016   HGB 13.0 08/06/2016   HCT 40.2 08/06/2016   MCV 89.7 08/06/2016   PLT 160 08/06/2016   No results for input(s): NA, K, CL, CO2, BUN, CREATININE, CALCIUM, PROT, BILITOT, ALKPHOS, ALT, AST, GLUCOSE in the last 168 hours.  Invalid input(s): LABALBU No results for input(s): CKTOTAL, CKMB, TROPONINI in the last 72 hours. No results found for: CHOL, HDL, LDLCALC, TRIG No results found for: DDIMER  Radiology/Studies:  No results found. Wt Readings from Last 3 Encounters:  12/25/17 98.6 kg  06/30/17 100.7 kg  09/05/16 99.4 kg    EKG: AF, slow ventricular response  Physical Exam: Blood pressure (!) 151/83, pulse (!) 40, temperature 98.6 F (37 C), temperature source Oral, resp. rate 19, height 6\' 2"  (1.88 m), weight 98.6 kg, SpO2 99 %. Body mass index is 27.91 kg/m. General: Well developed, well nourished, in no acute distress. Head: Normocephalic, atraumatic, sclera non-icteric, no xanthomas, nares are without discharge.  Neck: Negative for carotid bruits.  JVP 10 to 11 cm Lungs: Clear bilaterally to auscultation without wheezes, rales, or rhonchi. Breathing is unlabored. Heart: Bradycardic, irregular with normal S1 S2. No murmurs, rubs, or gallops appreciated. Abdomen: Soft, non-tender, non-distended with normoactive bowel sounds. No hepatomegaly. No rebound/guarding. No obvious  abdominal masses. Msk:  Strength and tone appear normal for age. Extremities: No clubbing or cyanosis.  Trace lower extremity edema.  Distal pedal pulses are 2+ and equal bilaterally. Neuro: Alert and oriented X 3. No focal deficit. No facial asymmetry. Moves all extremities spontaneously. Psych:  Responds to questions appropriately with a normal affect.    Assessment and Plan  67 year old man with ischemic cardiomyopathy and severe coronary disease, who presents with atrial fibrillation and slow ventricular response, and acute decompensated heart failure.  1.  Atrial fibrillation/bradycardia: Although the patient does not report symptoms that can be clearly attributed to his bradycardia, it is limiting the ability to add goal-directed medical therapy for heart failure.  As such, he meets criteria for pacemaker implantation.  Given his relatively high anticipated pacing burden and low EF, would plan for CRT or His bundle pacing.  His EF is on the cut off for ICD implantation; he did have an echo at the outside hospital which was read as an EF of 35 to 40%.  He does have a disc of this echocardiogram which can be reviewed, so I have not ordered a repeat as of yet.  It would be reasonable to implant a defibrillator lead, given that his EF is near 35%.  Continue to hold metoprolol for now.  I will defer anticoagulation at this point, but apixaban would be a good choice for this patient.    2.  Ischemic cardiomyopathy: Patient is still somewhat volume up on exam, though by history he has significantly improved with IV diuresis.  Will likely plan for additional dose of IV furosemide this morning, pending a.m. labs.  Holding metoprolol as above pending device placement.  Continue home ACE inhibitor at current dosing.  3.  CAD: Patient ruled out for acute coronary syndrome on serial troponin testing at the outside hospital.  Plan to continue home Plavix and aspirin for now; he is over 12 months out from his  most recent drug-eluting stent, and as such discontinuation of his Plavix could be considered.  The other option would be discontinuation of aspirin, continuing clopidogrel and initiating apixaban  4.  Hyperlipidemia: Continue high intensity atorvastatin.  5.  Hypertension: Continue home ACE inhibitor, adding metoprolol as aforementioned.    Signed, Esmond Plants, MD 12/25/2017, 2:36 AM

## 2017-12-25 NOTE — Progress Notes (Signed)
Progress Note  Patient Name: Michael DibbleRichard K Melton Date of Encounter: 12/25/2017  Primary Cardiologist: Dina RichJonathan Melton  Subjective   Please see H&P from this AM for full details. Briefly, he reports leg numbness, and his physician's office told him to report to the ER to be evaluated for clot/stroke. Workup for that was negative, but he was noted to be volume overloaded and bradycardic. He reports no chest pain, syncope/presyncope, chest pain or palpitations. His breathing is improved with diuresis, and he feels that he could lie on 1 pillow. Last dose of metoprolol was 12/23/17 in the AM. He was not on anticoagulation prior to presentation. He does have a history of severe CAD s/p CABG, ischemic cardiomyopathy (read as 40% here, outside echo reported as 35-40%), and incidentally found to have 4.6 cm thoracic aortic aneurysm.  Inpatient Medications    Scheduled Meds: . aspirin EC  81 mg Oral Daily  . atorvastatin  80 mg Oral Daily  . clopidogrel  75 mg Oral Q breakfast  . lisinopril  10 mg Oral Daily  . multivitamin with minerals  1 tablet Oral Daily  . pantoprazole  40 mg Oral Daily   Continuous Infusions:  PRN Meds: ondansetron (ZOFRAN) IV   Vital Signs    Vitals:   12/25/17 0600 12/25/17 0630 12/25/17 0700 12/25/17 0737  BP: 132/75 140/71 (!) 148/69   Pulse: (!) 40 (!) 41 (!) 37   Resp: 20 20 11    Temp:    98.1 F (36.7 C)  TempSrc:    Oral  SpO2: 96% 97% 99%   Weight:      Height:        Intake/Output Summary (Last 24 hours) at 12/25/2017 0843 Last data filed at 12/25/2017 0600 Gross per 24 hour  Intake -  Output 200 ml  Net -200 ml   Filed Weights   12/25/17 0100  Weight: 98.6 kg    Telemetry    Atrial fibrillation with slow ventricular rate - Personally Reviewed  ECG    Atrial fibrillation with slow ventricular rate, LBBB - Personally Reviewed  Physical Exam   GEN: No acute distress.  Mentor in place Neck: supple, JVD 10 cm Cardiac: bradycardic,  irregular S1 and S2, no murmurs, rubs, or gallops.  Respiratory: Clear to auscultation bilaterally. GI: Soft, nontender, non-distended. Bowel sounds normal MS: trace LE edema; No deformity. Neuro:  Nonfocal, moves all limbs independently Psych: Normal affect   Labs    ChemistryNo results for input(s): NA, K, CL, CO2, GLUCOSE, BUN, CREATININE, CALCIUM, PROT, ALBUMIN, AST, ALT, ALKPHOS, BILITOT, GFRNONAA, GFRAA, ANIONGAP in the last 168 hours.   HematologyNo results for input(s): WBC, RBC, HGB, HCT, MCV, MCH, MCHC, RDW, PLT in the last 168 hours.  Cardiac EnzymesNo results for input(s): TROPONINI in the last 168 hours. No results for input(s): TROPIPOC in the last 168 hours.   BNPNo results for input(s): BNP, PROBNP in the last 168 hours.   DDimer No results for input(s): DDIMER in the last 168 hours.   Radiology    No results found.  Cardiac Studies   Echo 07/10/2016 Left ventricle: The cavity size was at the upper limits of   normal. Wall thickness was increased in a pattern of mild LVH.   The estimated ejection fraction was 40%. Diffuse hypokinesis.   There is akinesis of the basalinferior myocardium. Doppler   parameters are consistent with abnormal left ventricular   relaxation (grade 1 diastolic dysfunction). - Aortic valve: Mildly calcified annulus. Trileaflet. -  Mitral valve: Mildly thickened leaflets . There was mild   regurgitation. - Left atrium: The atrium was mildly dilated. - Tricuspid valve: There was trivial regurgitation. - Pulmonary arteries: Systolic pressure could not be accurately   estimated. - Pericardium, extracardiac: There was no pericardial effusion.  Impressions: - Mild LVH with upper normal chamber size and LVEF approximately   40%. There is diffuse hypokinesis with inferior basal akinesis.   Probable grade 1 diastolic dysfunction. Mild left atrial   enlargement. Mildly thickened mitral leaflets with mild mitral   regurgitation.  Cath  08/05/16  Severe native three-vessel coronary artery disease.  Mid RCA lesion, 100 %stenosed. SVG to PDA is occluded. There are right to right and left to right collaterals.  Mid LAD lesion, 100 %stenosed. LIMA to LAD is atretic. LAD fills from flow from the SVG to diagonal filling the LAD.  Ost 2nd Mrg to 2nd Mrg lesion, 99 %stenosed. SVG to OM2 is occluded.  Ost 1st Mrg to 1st Mrg lesion, 80 %stenosed. Free RIMA graft to OM 1 is occluded. A STENT PROMUS PREM MR 3.5X28 drug eluting stent was successfully placed., postdilated to > 4mm.  Post intervention, there is a 0% residual stenosis.  Mid Cx lesion, 80 %stenosed. THis was treated with initial cutting balloon angioplasty with a 2.5 mm balloon prior to stent placement in the OM. Then after stent placement, PTCA was done with a 2.0 balloon.  Post intervention, there is a 10% residual stenosis.  The left ventricular ejection fraction is 25-35% by visual estimate.  LV end diastolic pressure is moderately elevated.  There is moderate to severe left ventricular systolic dysfunction.  There is no aortic valve stenosis.   Severe coronary artery disease. Severe graft disease. Only patent graft is the SVG to diagonal which feeds both the diagonal and the LAD. Successful stenting of a large OM1 and balloon angioplasty of the mid circumflex. The RCA territory fills by right to right and left to right collaterals. Continue aggressive secondary prevention. He'll need dual antiplatelet therapy for at least a year. Consider clopidogrel longer-term given the extent of his disease.   Patient Profile     67 y.o. male with PMH severe coronary artery disease status post CABG, ischemic cardiomyopathy (EF 35 to 40%), hypertension, hyperlipidemia, who presents as a transfer from [facility] for management of significant asymptomatic bradycardia and decompensated heart failure  Assessment & Plan    Bradycardia: has been off of metoprolol 48 hours and  still bradycardic to the 30s-40s. With bradycardia, heart failure, and inability to be on optimal medical therapy for his cardiomyopathy, he would benefit from a device. I have asked our EP team to see him. I think the question would be CRT-D vs CRT-P.  Atrial fibrillation: slow ventricular response. He is on aspirin/clopidogrel for severe CAD. Once anticoagulation is started, will need to consider dropping an antiplatelet.  Ischemic cardiomyopathy, with acute on chronic systolic and diastolic heart failure: feeling better with diuresis. Will give additional 40 mg IV today. On ACEI, will need to hold on beta blocker until after device placed.   Time Spent Directly with Patient: I have spent a total of 45 minutes with the patient reviewing hospital notes, telemetry, EKGs, labs and examining the patient as well as establishing an assessment and plan that was discussed personally with the patient.  > 50% of time was spent in direct patient care. Patient is at high risk for decompensation given his current cardiac rhythm and warrants monitoring in  a critical care setting, where he could be emergently transcutaneously paced if necessary.  CRITICAL CARE Performed by: Jodelle Red  Total critical care time: 45 minutes  Critical care time was exclusive of separately billable procedures and treating other patients. Critical care was necessary to treat or prevent imminent or life-threatening deterioration. Critical care was time spent personally by me on the following activities: development of treatment plan with patient and/or surrogate as well as nursing, discussions with consultants, evaluation of patient's response to treatment, examination of patient, obtaining history from patient or surrogate, ordering and performing treatments and interventions, ordering and review of laboratory studies, ordering and review of radiographic studies, pulse oximetry and re-evaluation of patient's  condition.  Length of Stay:  LOS: 1 day   Jodelle Red, MD, PhD [facility]  [facility]   12/25/2017, 8:43 AM   For questions or updates, please contact [facility] Please consult [email or web address] for contact info under Cardiology/STEMI.

## 2017-12-25 NOTE — Consult Note (Addendum)
ELECTROPHYSIOLOGY CONSULT NOTE    Patient ID: Michael Melton MRN: [id number], DOB/AGE: Jul 04, 1950 67 y.o.  Admit date: 12/25/2017 Date of Consult: 12/25/2017  Primary Physician: Estanislado PandySasser, Paul W, MD Primary Cardiologist: Branch Electrophysiologist: Ladona Ridgelaylor (new this admission)  Patient Profile: Michael Melton is a 67 y.o. male with a history of CAD s/p CABG, hypertension, hyperlipidemia, and ischemic cardiomyopathy who is being seen today for the evaluation of bradycardia at the request of Dr Cristal Deerhristopher.  HPI:  Michael Melton is a 67 y.o. male with the above past medical history.  He presented to [facility] with complaints of leg pain and was found to be severely bradycardic and in newly identified atrial fibrillation.  LE dopplers were done which were negative. CT demonstrated no PE.  His metoprolol has been held since Tuesday but he remains bradycardic with rates in the 30-40's.  EP has been asked to evaluate for treatment options.  He currently feels ok while in the bed without chest pain, palpitations, dyspnea, PND, orthopnea, nausea, vomiting, dizziness, syncope, edema, weight gain, or early satiety.  Echo 07/2016 demonstrated EF 40%, diffuse hypokinesis, grade 1 diastolic dysfunction.   Past Medical History:  Diagnosis Date  . CAD in native artery    08/05/16 Cath, atresia of LIMA to LAD, occlusion of SVG-->RCA, SVG-->Circumflex/OM2, SVG-->OM1, patent SVG to diag, DES to native circumflex.   . Erectile dysfunction   . Hyperlipidemia, mixed   . Myocardial infarction [facility](HCC)    Out of hospital myocardial infarction/inferior wall, status post Cardiolite study 2007.  Ejection fraction 45%  . Unspecified essential hypertension      Surgical History:  Past Surgical History:  Procedure Laterality Date  . CATARACT EXTRACTION    . CORONARY ARTERY BYPASS GRAFT     2001.  LIMA to LAD, SVG to OM1, SVG to OM 2, SVG to diagonal, SVG to PDA.  Marland Kitchen. CORONARY BALLOON ANGIOPLASTY N/A  08/05/2016   Procedure: Coronary Balloon Angioplasty;  Surgeon: Corky CraftsJayadeep S Varanasi, MD;  [facility];  Service: Cardiovascular;  Laterality: N/A;  CFX  . CORONARY STENT INTERVENTION N/A 08/05/2016   Procedure: Coronary Stent Intervention;  Surgeon: Corky CraftsJayadeep S Varanasi, MD;  [facility];  Service: Cardiovascular;  Laterality: N/A;  OM  . LAPAROSCOPIC CHOLECYSTECTOMY  06/22/2013   Dr. Cristy FolksBeacham  . LEFT HEART CATH AND CORS/GRAFTS ANGIOGRAPHY N/A 08/05/2016   Procedure: Left Heart Cath and Cors/Grafts Angiography;  Surgeon: Corky CraftsJayadeep S Varanasi, MD;  [facility];  Service: Cardiovascular;  Laterality: N/A;     Medications Prior to Admission  Medication Sig Dispense Refill Last Dose  . aspirin 81 MG tablet Take 81 mg by mouth daily.     Taking  . atorvastatin (LIPITOR) 40 MG tablet Take 40 mg by mouth daily.   Taking  . atorvastatin (LIPITOR) 80 MG tablet TAKE 1 TABLET (80 MG TOTAL) BY MOUTH DAILY. 90 tablet 1   . clopidogrel (PLAVIX) 75 MG tablet Take 1 tablet (75 mg total) by mouth daily with breakfast. 30 tablet 11 Taking  . lisinopril (PRINIVIL,ZESTRIL) 10 MG tablet TAKE 1 TABLET BY MOUTH EVERY DAY 90 tablet 3 Taking  . metoprolol succinate (TOPROL-XL) 100 MG 24 hr tablet TAKE 1 TABLET (100 MG TOTAL) BY MOUTH DAILY. TAKE WITH OR IMMEDIATELY FOLLOWING A MEAL. 90 tablet 3   . Multiple Vitamin (MULTIVITAMIN) tablet Take 1 tablet by mouth daily. Doesn't take this everyday   Taking  . nitroGLYCERIN (NITROSTAT) 0.4 MG SL  tablet Place 0.4 mg under the tongue every 5 (five) minutes as needed.    Taking  . pantoprazole (PROTONIX) 40 MG tablet Take 1 tablet (40 mg total) by mouth daily. 30 tablet 6 Taking    Inpatient Medications:  . aspirin EC  81 mg Oral Daily  . atorvastatin  80 mg Oral Daily  . chlorhexidine   Topical Once  . clopidogrel  75 mg Oral Q breakfast  . lisinopril  10 mg Oral Daily  . multivitamin with minerals  1 tablet Oral Daily  .  pantoprazole  40 mg Oral Daily    Allergies:  Allergies  Allergen Reactions  . Acetaminophen     REACTION: hallucinations    Social History   Socioeconomic History  . Marital status: Married    Spouse name: Not on file  . Number of children: Not on file  . Years of education: Not on file  . Highest education level: Not on file  Occupational History  . Occupation: Full time    Employer: [employer]  Social Needs  . Financial resource strain: Not on file  . Food insecurity:    Worry: Not on file    Inability: Not on file  . Transportation needs:    Medical: Not on file    Non-medical: Not on file  Tobacco Use  . Smoking status: Never Smoker  . Smokeless tobacco: Never Used  Substance and Sexual Activity  . Alcohol use: Yes    Alcohol/week: 0.0 standard drinks    Comment: RARE  . Drug use: No  . Sexual activity: Not on file  Lifestyle  . Physical activity:    Days per week: Not on file    Minutes per session: Not on file  . Stress: Not on file  Relationships  . Social connections:    Talks on phone: Not on file    Gets together: Not on file    Attends religious service: Not on file    Active member of club or organization: Not on file    Attends meetings of clubs or organizations: Not on file    Relationship status: Not on file  . Intimate partner violence:    Fear of current or ex partner: Not on file    Emotionally abused: Not on file    Physically abused: Not on file    Forced sexual activity: Not on file  Other Topics Concern  . Not on file  Social History Narrative  . Not on file     Family History  Problem Relation Age of Onset  . Alzheimer's disease Mother   . Brain cancer Mother        brain tumor  . Stroke Father   . Alzheimer's disease Father      Review of Systems: All other systems reviewed and are otherwise negative except as noted above.  Physical Exam: Vitals:   12/25/17 0600 12/25/17 0630 12/25/17 0700 12/25/17 0737  BP: 132/75  140/71 (!) 148/69   Pulse: (!) 40 (!) 41 (!) 37   Resp: 20 20 11    Temp:    98.1 F (36.7 C)  TempSrc:    Oral  SpO2: 96% 97% 99%   Weight:      Height:        GEN- The patient is well appearing, alert and oriented x 3 today.   HEENT: normocephalic, atraumatic; sclera clear, conjunctiva pink; hearing intact; oropharynx clear; neck supple Lungs- Clear to ausculation bilaterally, normal work of  breathing.  No wheezes, rales, rhonchi Heart- Bradycardic irregular rate and rhythm  GI- soft, non-tender, non-distended, bowel sounds present Extremities- no clubbing, cyanosis, or edema  MS- no significant deformity or atrophy Skin- warm and dry, no rash or lesion Psych- euthymic mood, full affect Neuro- strength and sensation are intact  Labs:   Lab Results  Component Value Date   WBC 9.8 08/06/2016   HGB 13.0 08/06/2016   HCT 40.2 08/06/2016   MCV 89.7 08/06/2016   PLT 160 08/06/2016   No results for input(s): NA, K, CL, CO2, BUN, CREATININE, CALCIUM, PROT, BILITOT, ALKPHOS, ALT, AST, GLUCOSE in the last 168 hours.  Invalid input(s): LABALBU    Radiology/Studies: No results found.  WGN:FAOZHY fibrillation with slow ventricular response, LBBB (personally reviewed)  TELEMETRY: AF, rates 30-40's (personally reviewed)  Assessment/Plan: 1.  Atrial fibrillation with slow ventricular response and LBBB Persistent despite BB washout Will plan for CRTP implant today with known cardiomyopathy Risks, benefits reviewed with the patient who wishes to proceed. CHADS2VASC is 4 - will need to consider anticoagulation post pacemaker.  He is on DAPT at home.  Will need to discuss best plan with interventional cardiology with anatomy at last cath.  2.  CAD s/p CABG No recent ischemic symptoms Continue current therapy Resume BB post pacemaker  3.  ICM/chronic systolic heart failure EF 35-40% by last echo Hopefully will improve with CRT Consider Entresto as an outpatient  For questions  or updates, please contact [facility] Please consult [email or web address] for contact info under Cardiology/STEMI.  Signed, Gypsy Balsam, NP 12/25/2017 8:58 AM  EP Attending  Patient seen and examined. Agree with above. The patient is a 67 yo man who presented with atrial fib and a slow VR, in the setting of leg pain. He had his beta blocker stopped and his HR have remained in the 30-40 range. The patient has class 2 dyspnea. He has an IVCD but will need pacing essentially 100% of the time with underlying high grade heart block. He has not had frank syncope.  His exam demonstrates a very well appearing man, NAD. I have discussed the treatment options with the patient and the risks/benefits/goals/expectations of Biv PPM insertion were reviewed and he wishes to proceed. I would anticipate attempts to get him back to [facility].   Janae Bonser,M.D.  Leonia Reeves.D.

## 2017-12-26 ENCOUNTER — Encounter (HOSPITAL_COMMUNITY): Payer: Self-pay | Admitting: Internal Medicine

## 2017-12-26 ENCOUNTER — Inpatient Hospital Stay (HOSPITAL_COMMUNITY): Payer: Medicare PPO

## 2017-12-26 MED ORDER — APIXABAN 5 MG PO TABS: 5.0000 mg | ORAL_TABLET | Freq: Two times a day (BID) | ORAL | 1 refills | Status: DC

## 2017-12-26 MED ORDER — METOPROLOL SUCCINATE ER 50 MG PO TB24
100.0000 mg | ORAL_TABLET | Freq: Every day | ORAL | Status: DC
  Administered 2017-12-26: 10:00:00 100 mg via ORAL
  Filled 2017-12-26: qty 2

## 2017-12-26 NOTE — Discharge Summary (Addendum)
ELECTROPHYSIOLOGY PROCEDURE DISCHARGE SUMMARY    Patient ID: Michael DibbleRichard K Eissler,  MRN: [id number], DOB/AGE: 09-17-1950 67 y.o.  Admit date: 12/25/2017 Discharge date: 12/26/2017  Primary Care Physician: Estanislado PandySasser, Paul W, MD Primary Cardiologist: Wyline MoodBranch Electrophysiologist: Ladona Ridgelaylor  Primary Discharge Diagnosis:  1. Symptomatic bradycardia status post pacemaker implantation this admission 2.  Newly identified atrial fibrillation  Secondary Discharge Diagnosis:  1.  CAD s/p CABG 2.  Chronic systolic heart failure 3.  Ischemic cardiomyopathy 4.  HTN 5.  Hyperlipidemia  Allergies  Allergen Reactions  . Acetaminophen     REACTION: hallucinations     Procedures This Admission:  1.  Implantation of a STJ CRTP on 12/25/17 by Dr Ladona Ridgelaylor.  See op note for full details. There were no immediate post procedure complications. 2.  CXR on 12/26/17 demonstrated no pneumothorax status post device implantation.   Brief HPI/Hospital Course:  Michael Melton is a 67 y.o. male with the above past medical history.  He presented to [facility] with complaints of leg pain and was found to be severely bradycardic and in newly identified atrial fibrillation.  LE dopplers were done which were negative. CT demonstrated no PE.  His metoprolol has been held since Tuesday but he remains bradycardic with rates in the 30-40's.  The patient has had symptomatic bradycardia without reversible causes identified.  Risks, benefits, and alternatives to CRTP implantation were reviewed with the patient who wished to proceed.  The patient underwent implantation of a STJ CRTP with details as outlined above.  He  was monitored on telemetry overnight which demonstrated AF with V pacing.  Left chest was without hematoma or ecchymosis.  The device was interrogated and found to be functioning normally.  CXR was obtained and demonstrated no pneumothorax status post device implantation.  Wound care, arm mobility, and restrictions  were reviewed with the patient.  The patient was examined and considered stable for discharge to home.   CHADS2VASC is at least 4.  He was on DAPT at home.  Plan will be to start Eliquis and stop Plavix per Dr Ladona Ridgelaylor.  Will need to consider DCCV after 4 weeks of anticoagulation. He can discuss further at appt with Dr Wyline MoodBranch.    Physical Exam: Vitals:   12/25/17 2030 12/25/17 2120 12/25/17 2201 12/26/17 0546  BP: (!) 171/78 (!) 173/78 (!) 165/68 (!) 179/94  Pulse: 70 70 70 70  Resp: (!) 29 20 19 20   Temp:    99 F (37.2 C)  TempSrc:    Oral  SpO2: 93% 95% 92% 95%  Weight:    97.5 kg  Height:        GEN- The patient is well appearing, alert and oriented x 3 today.   HEENT: normocephalic, atraumatic; sclera clear, conjunctiva pink; hearing intact; oropharynx clear; neck supple  Lungs- Clear to ausculation bilaterally, normal work of breathing.  No wheezes, rales, rhonchi Heart- Regular rate and rhythm (paced) GI- soft, non-tender, non-distended, bowel sounds present  Extremities- no clubbing, cyanosis, or edema  MS- no significant deformity or atrophy Skin- warm and dry, no rash or lesion, left chest without hematoma/ecchymosis Psych- euthymic mood, full affect Neuro- strength and sensation are intact   Labs:   Lab Results  Component Value Date   WBC 9.8 12/25/2017   HGB 14.7 12/25/2017   HCT 46.5 12/25/2017   MCV 92.4 12/25/2017   PLT 200 12/25/2017    Recent Labs  Lab 12/25/17 1155  NA 141  K 3.8  CL  100  CO2 35*  BUN 23  CREATININE 1.24  CALCIUM 9.6  GLUCOSE 113*    Discharge Medications:  Allergies as of 12/26/2017      Reactions   Acetaminophen    REACTION: hallucinations      Medication List    STOP taking these medications   clopidogrel 75 MG tablet Commonly known as:  PLAVIX     TAKE these medications   apixaban 5 MG Tabs tablet Commonly known as:  ELIQUIS Take 1 tablet (5 mg total) by mouth 2 (two) times daily.   aspirin 81 MG tablet Take  81 mg by mouth daily.   atorvastatin 80 MG tablet Commonly known as:  LIPITOR TAKE 1 TABLET (80 MG TOTAL) BY MOUTH DAILY.   lisinopril 10 MG tablet Commonly known as:  PRINIVIL,ZESTRIL TAKE 1 TABLET BY MOUTH EVERY DAY   metoprolol succinate 100 MG 24 hr tablet Commonly known as:  TOPROL-XL TAKE 1 TABLET (100 MG TOTAL) BY MOUTH DAILY. TAKE WITH OR IMMEDIATELY FOLLOWING A MEAL.   multivitamin tablet Take 1 tablet by mouth daily as needed.   nitroGLYCERIN 0.4 MG SL tablet Commonly known as:  NITROSTAT Place 0.4 mg under the tongue every 5 (five) minutes as needed.   pantoprazole 40 MG tablet Commonly known as:  PROTONIX Take 1 tablet (40 mg total) by mouth daily.   sildenafil 20 MG tablet Commonly known as:  REVATIO Take 20-100 mg by mouth as needed (30 minutes prior to sexual activity).       Disposition:   Follow-up Information    [facility] Follow up on 01/12/2018.   Specialty:  Cardiology Why:  at [facility] information: [address], [address] [place] [address] [phone]       Marinus Maw, MD Follow up on 04/09/2018.   Specialty:  Cardiology Why:  at 10:15AM Contact information: [address] [place] [address] [phone]        Antoine Poche, MD Follow up on 01/07/2018.   Specialty:  Cardiology Why:  at 11:20AM Contact information: [address] [place] [address] [phone]           Duration of Discharge Encounter: Greater than 30 minutes including physician time.  Signed, Gypsy Balsam, NP 12/26/2017 7:19 AM  EP Attending  Patient seen and examined. Agree with above. The patient is stable after Biv PPM insertion. He will be discharged home with usual followup. I would like him to start taking Eliquis in 2 days.   Leonia Reeves.D.

## 2017-12-26 NOTE — Care Management Note (Addendum)
Case Management Note  Patient Details  Name: Miguel DibbleRichard K Andreatta MRN: [id number] Date of Birth: Jun 18, 1950  Subjective/Objective:   From home, on plavix, has new afib will be on eliquis, NCM awaiting benefit check .   NCM called the pharmacy to see if they had cost of eliquis yet , she states the cost thru insurance is  312.00.  , NCM informed patient wife of this information, she would like for NCM to check xarelto for benefit check.  NCM awaiting benefit check  , per benefit check co pay is 47.00 , NCM informed patient of this information,  Not sure why pharmacy quoted high amount but per patient's insurance, co pay is 47.00.              Action/Plan: DC home when ready.   Expected Discharge Date:  12/26/17               Expected Discharge Plan:  Home/Self Care  In-House Referral:     Discharge planning Services  CM Consult, Medication Assistance  Post Acute Care Choice:    Choice offered to:     DME Arranged:    DME Agency:     HH Arranged:    HH Agency:     Status of Service:  Completed, signed off  If discussed at [employer] Length of Stay Meetings, dates discussed:    Additional Comments:  Leone Havenaylor, Koston Hennes [place], RN 12/26/2017, 9:15 AM

## 2017-12-26 NOTE — Consult Note (Signed)
            [facility] CM Primary Care Navigator  12/26/2017  Miguel DibbleRichard K Rahe [birth year] [id number]   Went to seepatient at the bedside to identify possible discharge needs buthe wasalreadydischargedhomeper staff report.  Per MD note,patientpresented as a transfer from [facility] with complaints of leg pain and was found to be severely bradycardic and in newly identified atrial fibrillation- will be on Eliquis (status post pacemaker insertion).  Primary care provider's office is listed as providing transition of care (TOC) follow-up.  Patient has discharge instruction to follow-up withcardiology on 01/07/18 and 01/12/18.   For additional questions please contact:  Karin GoldenLorraine A. Ashtyn Meland, BSN, RN-BC [facility] PRIMARY [facility] Navigator Cell: [phone]

## 2017-12-26 NOTE — Care Management (Signed)
12-26-17 BENEFITS CHECK:  #  2.   S/W CLESTE J.  @ [employer]  RX # [phone]  1. ELIQUIS  2.5 MG BID   COVER- YES CO-PAY- $ 47.00 TIER- 3 DRUG PRIOR APPROVAL- NO  2. ELIQUIS  5 MG BID COVER- YES CO-PAY- $ 47.00 TIER- 3 DRUG PRIOR APPROVAL- NO  3.XARELTO 15 MG BID COVER- YES CO-PAY- $ 47.00 TIER- 3 DRUG PRIOR APPROVAL- NO  4. XARELTO 20 MG DAILY COVER- YES CO-PAY- $ 47.00 TIER- 3 DRUG PRIOR APPROVAL- NO  PREFERRED PHARMACY : [facility]

## 2017-12-27 ENCOUNTER — Telehealth: Payer: Self-pay | Admitting: Physician Assistant

## 2017-12-27 ENCOUNTER — Other Ambulatory Visit: Payer: Self-pay | Admitting: Physician Assistant

## 2017-12-27 MED ORDER — LISINOPRIL 10 MG PO TABS: 10.0000 mg | ORAL_TABLET | Freq: Every day | ORAL | 11 refills | Status: DC

## 2017-12-27 MED ORDER — RIVAROXABAN 20 MG PO TABS: 20.0000 mg | ORAL_TABLET | Freq: Every day | ORAL | 11 refills | Status: DC

## 2017-12-27 NOTE — Telephone Encounter (Signed)
Patient had gotten device this week.  He had a question about his medications.  He had previously been on Plavix, but when he left the hospital, he was put on Eliquis.  However, he will not be able to afford this and was told at the hospital that it would be possible to switch to Xarelto, but this was not done.  He wanted to know if this is still an option as he will not be able to afford the Eliquis.  Additionally, he was concerned because he used to be on Plavix and now he is not.  He wanted to make sure we meant to do that.  I reviewed the notes and advised him that it is fine for him to be off the Plavix, Dr. Lubertha Basqueaylor's note was clear that he was going to stop it.  I advised that it was okay to change him from Eliquis to Xarelto, and send in a prescription for that.  No other issues or concerns.  Theodore Demarkhonda Kely Dohn, PA-C 12/27/2017 11:30 AM Beeper [phone]

## 2017-12-29 ENCOUNTER — Telehealth: Payer: Self-pay | Admitting: Cardiology

## 2017-12-29 DIAGNOSIS — I4891 Unspecified atrial fibrillation: Secondary | ICD-10-CM

## 2017-12-29 HISTORY — DX: Unspecified atrial fibrillation: I48.91

## 2017-12-29 MED ORDER — PANTOPRAZOLE SODIUM 40 MG PO TBEC: 40.0000 mg | DELAYED_RELEASE_TABLET | Freq: Every day | ORAL | 1 refills | Status: DC

## 2017-12-29 NOTE — Telephone Encounter (Signed)
Done

## 2017-12-29 NOTE — Telephone Encounter (Signed)
°*  STAT* If patient is at the pharmacy, call can be transferred to refill team.   1. Which medications need to be refilled?  pantoprazole (PROTONIX) 40 MG tablet     2. Which pharmacy/location (including street and city if local pharmacy) is medication to be sent to? [facility]  3. Do they need a 30 day or 90 day supply?

## 2018-01-07 ENCOUNTER — Ambulatory Visit (INDEPENDENT_AMBULATORY_CARE_PROVIDER_SITE_OTHER): Payer: Medicare PPO | Admitting: Cardiology

## 2018-01-07 ENCOUNTER — Encounter: Payer: Self-pay | Admitting: Cardiology

## 2018-01-07 VITALS — BP 147/87 | HR 69 | Ht 74.0 in | Wt 219.0 lb

## 2018-01-07 DIAGNOSIS — I5022 Chronic systolic (congestive) heart failure: Secondary | ICD-10-CM

## 2018-01-07 DIAGNOSIS — I4891 Unspecified atrial fibrillation: Secondary | ICD-10-CM | POA: Diagnosis not present

## 2018-01-07 DIAGNOSIS — I10 Essential (primary) hypertension: Secondary | ICD-10-CM

## 2018-01-07 DIAGNOSIS — I2510 Atherosclerotic heart disease of native coronary artery without angina pectoris: Secondary | ICD-10-CM | POA: Diagnosis not present

## 2018-01-07 DIAGNOSIS — I442 Atrioventricular block, complete: Secondary | ICD-10-CM | POA: Diagnosis not present

## 2018-01-07 NOTE — Patient Instructions (Signed)
Your physician recommends that you schedule a follow-up appointment in: 3 MONTHS WITH DR [facility] and 2 1/2 WEEK NURSE VISIT FOR EKG   Your physician has recommended you make the following change in your medication:   STOP PLAVIX AND ASPIRIN   Thank you for choosing [facility]!!

## 2018-01-07 NOTE — Progress Notes (Signed)
Clinical Summary Michael Melton is a 67 y.o.male seen today for follow up of the following medical problems.     1. CAD - hx of CABG in 2001, at [facility].  - GXT 09/2012 with no ischemic changes, did have frequent PVCs, 3 beat run of NSVT - echo 01/2013 LVEF 50%, grade I diastoilc dysfunction.  - echo 3/32018 LVEF 40%, grade I diastolic dysfunction - 06/2016 nuclear stress primarily scar with mild peri-infarct ischemia. LVEF 40%, intermediate risk.    - cath 08/2016: mid LAD occluded, mid LCX 80%, RCA 100%. OM 1 80%. OM2 99%. SVG-RPDA occluded, LIMA-LAD atretic and occluded in midportion, LAD fills from SVG-diag. SVG-D2 patent, RIMA-OM1 occluded, SVG-OM2 occluded. RCA territory fills by collaterals Received angiolpasty to LCX, OM1 80% receivded DES. Recs for indefinite plavix.   - plavix stopped 12/2017 admission after starting eliquis for afib -denies any recent chest pain.     2. Hyperlipidemia - he is compliant with statin  3, HTN -remains compliant with meds   4. Symptomatic bradycardia - s/p pacemaker placement 12/2017, [employer] BiV pacemaker for complete AV block - no recurrent symptoms.    5. Afib - new diagnosis during 12/2017 admission - prescribed xarelto but has not started yet, he wanted to discuss in clinic today.      Past Medical History:  Diagnosis Date  . AF (atrial fibrillation) (HCC) 12/29/2017  . CAD in native artery    08/05/16 Cath, atresia of LIMA to LAD, occlusion of SVG-->RCA, SVG-->Circumflex/OM2, SVG-->OM1, patent SVG to diag, DES to native circumflex.   . Erectile dysfunction   . Hyperlipidemia, mixed   . Myocardial infarction [facility])    Out of hospital myocardial infarction/inferior wall, status post Cardiolite study 2007.  Ejection fraction 45%  . Unspecified essential hypertension      Allergies  Allergen Reactions  . Acetaminophen     REACTION: hallucinations     Current Outpatient Medications  Medication Sig Dispense Refill    . aspirin 81 MG tablet Take 81 mg by mouth daily.      Marland Kitchen atorvastatin (LIPITOR) 80 MG tablet TAKE 1 TABLET (80 MG TOTAL) BY MOUTH DAILY. 90 tablet 1  . lisinopril (PRINIVIL,ZESTRIL) 10 MG tablet Take 1 tablet (10 mg total) by mouth daily. 30 tablet 11  . metoprolol succinate (TOPROL-XL) 100 MG 24 hr tablet TAKE 1 TABLET (100 MG TOTAL) BY MOUTH DAILY. TAKE WITH OR IMMEDIATELY FOLLOWING A MEAL. 90 tablet 3  . Multiple Vitamin (MULTIVITAMIN) tablet Take 1 tablet by mouth daily as needed.     . nitroGLYCERIN (NITROSTAT) 0.4 MG SL tablet Place 0.4 mg under the tongue every 5 (five) minutes as needed.     . pantoprazole (PROTONIX) 40 MG tablet Take 1 tablet (40 mg total) by mouth daily. 30 tablet 1  . rivaroxaban (XARELTO) 20 MG TABS tablet Take 1 tablet (20 mg total) by mouth daily. 30 tablet 11  . sildenafil (REVATIO) 20 MG tablet Take 20-100 mg by mouth as needed (30 minutes prior to sexual activity).   6   No current facility-administered medications for this visit.      Past Surgical History:  Procedure Laterality Date  . BIV PACEMAKER INSERTION CRT-P N/A 12/25/2017   Procedure: BIV PACEMAKER INSERTION CRT-P;  Surgeon: Marinus Maw, MD;  Location: [facility] INVASIVE CV LAB;  Service: Cardiovascular;  Laterality: N/A;  . CATARACT EXTRACTION    . CORONARY ARTERY BYPASS GRAFT     2001.  LIMA  to LAD, SVG to OM1, SVG to OM 2, SVG to diagonal, SVG to PDA.  Marland Kitchen CORONARY BALLOON ANGIOPLASTY N/A 08/05/2016   Procedure: Coronary Balloon Angioplasty;  Surgeon: Corky Crafts, MD;  Location: [facility] INVASIVE CV LAB;  Service: Cardiovascular;  Laterality: N/A;  CFX  . CORONARY STENT INTERVENTION N/A 08/05/2016   Procedure: Coronary Stent Intervention;  Surgeon: Corky Crafts, MD;  Location: [facility] INVASIVE CV LAB;  Service: Cardiovascular;  Laterality: N/A;  OM  . LAPAROSCOPIC CHOLECYSTECTOMY  06/22/2013   Dr. Cristy Folks  . LEFT HEART CATH AND CORS/GRAFTS ANGIOGRAPHY N/A 08/05/2016   Procedure: Left Heart Cath and  Cors/Grafts Angiography;  Surgeon: Corky Crafts, MD;  Location: [facility] INVASIVE CV LAB;  Service: Cardiovascular;  Laterality: N/A;     Allergies  Allergen Reactions  . Acetaminophen     REACTION: hallucinations      Family History  Problem Relation Age of Onset  . Alzheimer's disease Mother   . Brain cancer Mother        brain tumor  . Stroke Father   . Alzheimer's disease Father      Social History Michael Melton reports that he has never smoked. He has never used smokeless tobacco. Michael Melton reports that he drinks alcohol.   Review of Systems CONSTITUTIONAL: No weight loss, fever, chills, weakness or fatigue.  HEENT: Eyes: No visual loss, blurred vision, double vision or yellow sclerae.No hearing loss, sneezing, congestion, runny nose or sore throat.  SKIN: No rash or itching.  CARDIOVASCULAR: per hpi RESPIRATORY: No shortness of breath, cough or sputum.  GASTROINTESTINAL: No anorexia, nausea, vomiting or diarrhea. No abdominal pain or blood.  GENITOURINARY: No burning on urination, no polyuria NEUROLOGICAL: No headache, dizziness, syncope, paralysis, ataxia, numbness or tingling in the extremities. No change in bowel or bladder control.  MUSCULOSKELETAL: No muscle, back pain, joint pain or stiffness.  LYMPHATICS: No enlarged nodes. No history of splenectomy.  PSYCHIATRIC: No history of depression or anxiety.  ENDOCRINOLOGIC: No reports of sweating, cold or heat intolerance. No polyuria or polydipsia.  Marland Kitchen   Physical Examination Vitals:   01/07/18 1112  BP: (!) 147/87  Pulse: 69   Vitals:   01/07/18 1112  Weight: 219 lb (99.3 kg)  Height: 6\' 2"  (1.88 m)    Gen: resting comfortably, no acute distress HEENT: no scleral icterus, pupils equal round and reactive, no palptable cervical adenopathy,  CV: irreg, no m/r/g, no jvd Resp: Clear to auscultation bilaterally GI: abdomen is soft, non-tender, non-distended, normal bowel sounds, no hepatosplenomegaly MSK:  extremities are warm, no edema.  Skin: warm, no rash Neuro:  no focal deficits Psych: appropriate affect   Diagnostic Studies 01/2013 Echo Study Conclusions  - Left ventricle: Systolic function is low normal, with an estimated EF of 50-55%, but closer to 50%. There is mild global hypokinesis. The cavity size was normal. Wall thickness was increased in a pattern of mild LVH. There was an increased relative contribution of atrial contraction to ventricular filling. Doppler parameters are consistent with abnormal left ventricular relaxation (grade 1 diastolic dysfunction). - Ventricular septum: Motion consistent with post-operative changes. The contour showed diastolic flattening. - Left atrium: The atrium was mildly dilated.   09/2012 ETT Exc 6 min 50 sec, 10.1 METs. No specific ischemic changes, frequent PVCs and PACs, 3 beat run of NSVT.   06/2016 Nuclear stress test  ECG nondiagnostic due to IVCD. No arrhythmias noted during exercise.  Blood pressure demonstrated a hypertensive response to exercise.  Medium,  moderate intensity, inferior/inferolateral defect extended from apex to base. There is a partially reversible zone in the inferolateral wall at the base. Most suggestive of scar with mild peri-infarct ischemia.  This is an intermediate risk study.  Nuclear stress EF: 40%. Consider confirmation with echocardiography.   07/2016 echo Study Conclusions  - Left ventricle: The cavity size was at the upper limits of normal. Wall thickness was increased in a pattern of mild LVH. The estimated ejection fraction was 40%. Diffuse hypokinesis. There is akinesis of the basalinferior myocardium. Doppler parameters are consistent with abnormal left ventricular relaxation (grade 1 diastolic dysfunction). - Aortic valve: Mildly calcified annulus. Trileaflet. - Mitral valve: Mildly thickened leaflets . There was mild regurgitation. - Left atrium: The atrium was  mildly dilated. - Tricuspid valve: There was trivial regurgitation. - Pulmonary arteries: Systolic pressure could not be accurately estimated. - Pericardium, extracardiac: There was no pericardial effusion.  Impressions:  - Mild LVH with upper normal chamber size and LVEF approximately 40%. There is diffuse hypokinesis with inferior basal akinesis. Probable grade 1 diastolic dysfunction. Mild left atrial enlargement. Mildly thickened mitral leaflets with mild mitral regurgitation.   08/2016 cath  Severe native three-vessel coronary artery disease.  Mid RCA lesion, 100 %stenosed. SVG to PDA is occluded. There are right to right and left to right collaterals.  Mid LAD lesion, 100 %stenosed. LIMA to LAD is atretic. LAD fills from flow from the SVG to diagonal filling the LAD.  Ost 2nd Mrg to 2nd Mrg lesion, 99 %stenosed. SVG to OM2 is occluded.  Ost 1st Mrg to 1st Mrg lesion, 80 %stenosed. Free RIMA graft to OM 1 is occluded. A STENT PROMUS PREM MR 3.5X28 drug eluting stent was successfully placed., postdilated to > 63mm.  Post intervention, there is a 0% residual stenosis.  Mid Cx lesion, 80 %stenosed. THis was treated with initial cutting balloon angioplasty with a 2.5 mm balloon prior to stent placement in the OM. Then after stent placement, PTCA was done with a 2.0 balloon.  Post intervention, there is a 10% residual stenosis.  The left ventricular ejection fraction is 25-35% by visual estimate.  LV end diastolic pressure is moderately elevated.  There is moderate to severe left ventricular systolic dysfunction.  There is no aortic valve stenosis.  Severe coronary artery disease. Severe graft disease. Only patent graft is the SVG to diagonal which feeds both the diagonal and the LAD. Successful stenting of a large OM1 and balloon angioplasty of the mid circumflex. The RCA territory fills by right to right and left to right collaterals. Continue aggressive  secondary prevention. He'll need dual antiplatelet therapy for at least a year. Consider clopidogrel longer-term given the extent of his disease.     Assessment and Plan  1. CAD/Chronic systolic HF/ICM -no symptoms. Continue current medical therapy. He will be only on xarelto, no aspirin or plavix.   2. Hyperlipidemia -we will continue current statin.   3. HTN - elevated in clinic, if persistent trend will need further titration of meds  4. Complete heart block - f/u with device clinic next week  5. Afib - he will start his xarelto today. F/u in 2/5 weeks for EKG check, if still in afib plan for DCCV. Also follow afib report from device check, if paroxysmal would not pursue DCCV - EKG today shows afib, V pacing.   F/u 3 months    Antoine Poche, M.D.

## 2018-01-12 ENCOUNTER — Ambulatory Visit (INDEPENDENT_AMBULATORY_CARE_PROVIDER_SITE_OTHER): Payer: Medicare PPO | Admitting: *Deleted

## 2018-01-12 ENCOUNTER — Ambulatory Visit: Payer: Medicare PPO

## 2018-01-12 DIAGNOSIS — I442 Atrioventricular block, complete: Secondary | ICD-10-CM | POA: Diagnosis not present

## 2018-01-12 DIAGNOSIS — I4891 Unspecified atrial fibrillation: Secondary | ICD-10-CM

## 2018-01-12 LAB — CUP PACEART INCLINIC DEVICE CHECK
Battery Remaining Longevity: 61 mo
Brady Statistic RA Percent Paced: 0 %
Brady Statistic RV Percent Paced: 89 %
Implantable Lead Implant Date: 20190822
Implantable Lead Implant Date: 20190822
Implantable Lead Implant Date: 20190822
Implantable Lead Location: 753858
Implantable Lead Location: 753859
Implantable Pulse Generator Implant Date: 20190822
Lead Channel Impedance Value: 625 Ohm
Lead Channel Pacing Threshold Amplitude: 0.5 V
Lead Channel Pacing Threshold Amplitude: 1.75 V
Lead Channel Pacing Threshold Pulse Width: 0.5 ms
Lead Channel Pacing Threshold Pulse Width: 0.5 ms
Lead Channel Pacing Threshold Pulse Width: 0.5 ms
Lead Channel Setting Pacing Amplitude: 3.5 V
Lead Channel Setting Pacing Amplitude: 3.5 V
Lead Channel Setting Pacing Pulse Width: 0.5 ms
Lead Channel Setting Pacing Pulse Width: 0.5 ms
Lead Channel Setting Sensing Sensitivity: 2 mV
MDC IDC LEAD LOCATION: 753860
MDC IDC MSMT BATTERY VOLTAGE: 3.02 V
MDC IDC MSMT LEADCHNL LV IMPEDANCE VALUE: 637.5 Ohm
MDC IDC MSMT LEADCHNL LV PACING THRESHOLD AMPLITUDE: 1.75 V
MDC IDC MSMT LEADCHNL LV PACING THRESHOLD PULSEWIDTH: 0.5 ms
MDC IDC MSMT LEADCHNL RA SENSING INTR AMPL: 5 mV
MDC IDC MSMT LEADCHNL RV IMPEDANCE VALUE: 700 Ohm
MDC IDC MSMT LEADCHNL RV PACING THRESHOLD AMPLITUDE: 0.5 V
MDC IDC MSMT LEADCHNL RV SENSING INTR AMPL: 12 mV
MDC IDC SESS DTM: 20190909120645
MDC IDC SET LEADCHNL RV PACING AMPLITUDE: 3.5 V
Pulse Gen Serial Number: 9056721

## 2018-01-12 NOTE — Progress Notes (Signed)
Wound check appointment. Steri-strips removed. Wound without redness or edema. Incision edges approximated, wound well healed. Normal device function. Thresholds, sensing, and impedances consistent with implant measurements. Device programmed at 3.5V for extra safety margin until 3 month visit. Histogram distribution appropriate for patient and level of activity. 89% Biv pacing. DCCV being considered by Dr. Wyline Mood. >99% AT/AF- Xarelto. No high ventricular rates noted. Patient educated about wound care, arm mobility, lifting restrictions and Merlin monitor. ROV with GT/R 04/09/18.

## 2018-01-22 ENCOUNTER — Other Ambulatory Visit: Payer: Self-pay | Admitting: Cardiology

## 2018-01-26 ENCOUNTER — Ambulatory Visit (INDEPENDENT_AMBULATORY_CARE_PROVIDER_SITE_OTHER): Payer: Medicare PPO | Admitting: *Deleted

## 2018-01-26 DIAGNOSIS — I4891 Unspecified atrial fibrillation: Secondary | ICD-10-CM

## 2018-01-26 NOTE — Progress Notes (Signed)
Presents today for EKG pending DCCV. Medications reconciled. Taken all doses without side effects. No dizziness, chest pain or sob. Patient c/o congestion and coughing x's 2 weeks. Denies fever or chills.

## 2018-01-26 NOTE — Progress Notes (Signed)
Pt aware and agreeable - wants to wait until week after next to schedule - he has prior obligations next week - also says he hasn't missed a single dose of Xarelto    EKG shows patient remains in afib, also shown by his recent device check. He will require a cardioversion. Make sure he has not missed and doses of his xarelto since starting the day of our clinic visit 01/07/18 and if not then please arrange a cardioversion for next week    Dominga FerryJ Branch MD

## 2018-01-27 ENCOUNTER — Telehealth: Payer: Self-pay | Admitting: Cardiology

## 2018-01-27 NOTE — Telephone Encounter (Signed)
New message  Patient's wife wants a call to discuss the necessity of the cardioversion.

## 2018-01-27 NOTE — Telephone Encounter (Signed)
Pt and wife are concerned regarding cardioversion - spent significant amount of time explaining procedure - pt was then agreeable and was scheduled for 10/11 for cardioversion with Dr Diona BrownerMcDowell (was scheduled for pt per his request that he had prior obligations next week) only wants Dr Wyline MoodBranch to do this and would be willing to come in whenever he was available to do the cardioversion - told pt would check and see when Dr Wyline MoodBranch would have availability

## 2018-01-27 NOTE — Telephone Encounter (Signed)
LM to return call.

## 2018-01-30 NOTE — Telephone Encounter (Signed)
Spoke with patient today, can we arrange a cardioversion at [facility] mid to late next week with me   Dominga Ferry MD

## 2018-01-31 ENCOUNTER — Telehealth: Payer: Self-pay | Admitting: Cardiology

## 2018-01-31 DIAGNOSIS — Z6828 Body mass index (BMI) 28.0-28.9, adult: Secondary | ICD-10-CM | POA: Diagnosis not present

## 2018-01-31 DIAGNOSIS — G4700 Insomnia, unspecified: Secondary | ICD-10-CM | POA: Diagnosis not present

## 2018-01-31 DIAGNOSIS — F410 Panic disorder [episodic paroxysmal anxiety] without agoraphobia: Secondary | ICD-10-CM | POA: Diagnosis not present

## 2018-01-31 DIAGNOSIS — J069 Acute upper respiratory infection, unspecified: Secondary | ICD-10-CM | POA: Diagnosis not present

## 2018-01-31 DIAGNOSIS — I4891 Unspecified atrial fibrillation: Secondary | ICD-10-CM | POA: Diagnosis not present

## 2018-01-31 NOTE — Telephone Encounter (Signed)
Received page from patient.  He has been unable to sleep for the last 4 to 5 weeks, and felt very uneasy, and as if he had a sense of impending doom.  911 was called and the EMTs did an ECG, which showed normal pacing, per report.  Patient and wife are requesting possible insomnia medication for anxiety medication.  I advised him that I thought he should be evaluated in person (i.e. urgent care or ED) before making this determination.  He expressed understanding and agreement.  Esmond Plants, MD

## 2018-02-02 ENCOUNTER — Other Ambulatory Visit: Payer: Self-pay | Admitting: Cardiology

## 2018-02-02 NOTE — Telephone Encounter (Signed)
Thanks, order have been placed   Dominga Ferry MD

## 2018-02-02 NOTE — Telephone Encounter (Signed)
Rescheduled cardioversion Wednesday with Dr Wyline Mood @ 11:30am and pre op is tomorrow at 1pm - pt aware and voiced understanding of directions

## 2018-02-02 NOTE — Patient Instructions (Signed)
Michael Melton  02/02/2018     @PREFPERIOPPHARMACY @   Your procedure is scheduled on  02/04/2018 .  Report to [facility] at  1000   A.M.  Call this number if you have problems the morning of surgery:  [phone]   Remember:  Do not eat or drink after midnight.  You may drink clear liquids until  12 midnight 02/03/2018 .  Clear liquids allowed are:                    Water, Juice (non-citric and without pulp), Carbonated beverages, Clear Tea, Black Coffee only, Plain Jell-O only, Gatorade and Plain Popsicles only    Take these medicines the morning of surgery with A SIP OF WATER  Lisinopril, metoprolol, protonix.    Do not wear jewelry, make-up or nail polish.  Do not wear lotions, powders, or perfumes, or deodorant.  Do not shave 48 hours prior to surgery.  Men may shave face and neck.  Do not bring valuables to the hospital.  [facility] is not responsible for any belongings or valuables.  Contacts, dentures or bridgework may not be worn into surgery.  Leave your suitcase in the car.  After surgery it may be brought to your room.  For patients admitted to the hospital, discharge time will be determined by your treatment team.  Patients discharged the day of surgery will not be allowed to drive home.   Name and phone number of your driver:   family Special instructions:  None  Please read over the following fact sheets that you were given. Anesthesia Post-op Instructions and Care and Recovery After Surgery       Electrical Cardioversion Electrical cardioversion is the delivery of a jolt of electricity to restore a normal rhythm to the heart. A rhythm that is too fast or is not regular keeps the heart from pumping well. In this procedure, sticky patches or metal paddles are placed on the chest to deliver electricity to the heart from a device. This procedure may be done in an emergency if:  There is low or no blood pressure as a result of the heart  rhythm.  Normal rhythm must be restored as fast as possible to protect the brain and heart from further damage.  It may save a life.  This procedure may also be done for irregular or fast heart rhythms that are not immediately life-threatening. Tell a health care provider about:  Any allergies you have.  All medicines you are taking, including vitamins, herbs, eye drops, creams, and over-the-counter medicines.  Any problems you or family members have had with anesthetic medicines.  Any blood disorders you have.  Any surgeries you have had.  Any medical conditions you have.  Whether you are pregnant or may be pregnant. What are the risks? Generally, this is a safe procedure. However, problems may occur, including:  Allergic reactions to medicines.  A blood clot that breaks free and travels to other parts of your body.  The possible return of an abnormal heart rhythm within hours or days after the procedure.  Your heart stopping (cardiac arrest). This is rare.  What happens before the procedure? Medicines  Your health care provider may have you start taking: ? Blood-thinning medicines (anticoagulants) so your blood does not clot as easily. ? Medicines may be given to help stabilize your heart rate and rhythm.  Ask your health care provider about changing  or stopping your regular medicines. This is especially important if you are taking diabetes medicines or blood thinners. General instructions  Plan to have someone take you home from the hospital or clinic.  If you will be going home right after the procedure, plan to have someone with you for 24 hours.  Follow instructions from your health care provider about eating or drinking restrictions. What happens during the procedure?  To lower your risk of infection: ? Your health care team will wash or sanitize their hands. ? Your skin will be washed with soap.  An IV tube will be inserted into one of your veins.  You  will be given a medicine to help you relax (sedative).  Sticky patches (electrodes) or metal paddles may be placed on your chest.  An electrical shock will be delivered. The procedure may vary among health care providers and hospitals. What happens after the procedure?  Your blood pressure, heart rate, breathing rate, and blood oxygen level will be monitored until the medicines you were given have worn off.  Do not drive for 24 hours if you were given a sedative.  Your heart rhythm will be watched to make sure it does not change. This information is not intended to replace advice given to you by your health care provider. Make sure you discuss any questions you have with your health care provider. Document Released: 04/12/2002 Document Revised: 12/20/2015 Document Reviewed: 10/27/2015 Elsevier Interactive Patient Education  2017 [employer].  Electrical Cardioversion, Care After This sheet gives you information about how to care for yourself after your procedure. Your health care provider may also give you more specific instructions. If you have problems or questions, contact your health care provider. What can I expect after the procedure? After the procedure, it is common to have:  Some redness on the skin where the shocks were given.  Follow these instructions at home:  Do not drive for 24 hours if you were given a medicine to help you relax (sedative).  Take over-the-counter and prescription medicines only as told by your health care provider.  Ask your health care provider how to check your pulse. Check it often.  Rest for 48 hours after the procedure or as told by your health care provider.  Avoid or limit your caffeine use as told by your health care provider. Contact a health care provider if:  You feel like your heart is beating too quickly or your pulse is not regular.  You have a serious muscle cramp that does not go away. Get help right away if:  You have  discomfort in your chest.  You are dizzy or you feel faint.  You have trouble breathing or you are short of breath.  Your speech is slurred.  You have trouble moving an arm or leg on one side of your body.  Your fingers or toes turn cold or blue. This information is not intended to replace advice given to you by your health care provider. Make sure you discuss any questions you have with your health care provider. Document Released: 02/10/2013 Document Revised: 11/24/2015 Document Reviewed: 10/27/2015 Elsevier Interactive Patient Education  2018 [employer]  Monitored Anesthesia Care Anesthesia is a term that refers to techniques, procedures, and medicines that help a person stay safe and comfortable during a medical procedure. Monitored anesthesia care, or sedation, is one type of anesthesia. Your anesthesia specialist may recommend sedation if you will be having a procedure that does not require you to  be unconscious, such as:  Cataract surgery.  A dental procedure.  A biopsy.  A colonoscopy.  During the procedure, you may receive a medicine to help you relax (sedative). There are three levels of sedation:  Mild sedation. At this level, you may feel awake and relaxed. You will be able to follow directions.  Moderate sedation. At this level, you will be sleepy. You may not remember the procedure.  Deep sedation. At this level, you will be asleep. You will not remember the procedure.  The more medicine you are given, the deeper your level of sedation will be. Depending on how you respond to the procedure, the anesthesia specialist may change your level of sedation or the type of anesthesia to fit your needs. An anesthesia specialist will monitor you closely during the procedure. Let your health care provider know about:  Any allergies you have.  All medicines you are taking, including vitamins, herbs, eye drops, creams, and over-the-counter medicines.  Any use of steroids  (by mouth or as a cream).  Any problems you or family members have had with sedatives and anesthetic medicines.  Any blood disorders you have.  Any surgeries you have had.  Any medical conditions you have, such as sleep apnea.  Whether you are pregnant or may be pregnant.  Any use of cigarettes, alcohol, or street drugs. What are the risks? Generally, this is a safe procedure. However, problems may occur, including:  Getting too much medicine (oversedation).  Nausea.  Allergic reaction to medicines.  Trouble breathing. If this happens, a breathing tube may be used to help with breathing. It will be removed when you are awake and breathing on your own.  Heart trouble.  Lung trouble.  Before the procedure Staying hydrated Follow instructions from your health care provider about hydration, which may include:  Up to 2 hours before the procedure - you may continue to drink clear liquids, such as water, clear fruit juice, black coffee, and plain tea.  Eating and drinking restrictions Follow instructions from your health care provider about eating and drinking, which may include:  8 hours before the procedure - stop eating heavy meals or foods such as meat, fried foods, or fatty foods.  6 hours before the procedure - stop eating light meals or foods, such as toast or cereal.  6 hours before the procedure - stop drinking milk or drinks that contain milk.  2 hours before the procedure - stop drinking clear liquids.  Medicines Ask your health care provider about:  Changing or stopping your regular medicines. This is especially important if you are taking diabetes medicines or blood thinners.  Taking medicines such as aspirin and ibuprofen. These medicines can thin your blood. Do not take these medicines before your procedure if your health care provider instructs you not to.  Tests and exams  You will have a physical exam.  You may have blood tests done to show: ? How  well your kidneys and liver are working. ? How well your blood can clot.  General instructions  Plan to have someone take you home from the hospital or clinic.  If you will be going home right after the procedure, plan to have someone with you for 24 hours.  What happens during the procedure?  Your blood pressure, heart rate, breathing, level of pain and overall condition will be monitored.  An IV tube will be inserted into one of your veins.  Your anesthesia specialist will give you medicines as needed  to keep you comfortable during the procedure. This may mean changing the level of sedation.  The procedure will be performed. After the procedure  Your blood pressure, heart rate, breathing rate, and blood oxygen level will be monitored until the medicines you were given have worn off.  Do not drive for 24 hours if you received a sedative.  You may: ? Feel sleepy, clumsy, or nauseous. ? Feel forgetful about what happened after the procedure. ? Have a sore throat if you had a breathing tube during the procedure. ? Vomit. This information is not intended to replace advice given to you by your health care provider. Make sure you discuss any questions you have with your health care provider. Document Released: 01/16/2005 Document Revised: 09/29/2015 Document Reviewed: 08/13/2015 Elsevier Interactive Patient Education  2018 [employer] Monitored Anesthesia Care, Care After These instructions provide you with information about caring for yourself after your procedure. Your health care provider may also give you more specific instructions. Your treatment has been planned according to current medical practices, but problems sometimes occur. Call your health care provider if you have any problems or questions after your procedure. What can I expect after the procedure? After your procedure, it is common to:  Feel sleepy for several hours.  Feel clumsy and have poor balance for several  hours.  Feel forgetful about what happened after the procedure.  Have poor judgment for several hours.  Feel nauseous or vomit.  Have a sore throat if you had a breathing tube during the procedure.  Follow these instructions at home: For at least 24 hours after the procedure:   Do not: ? Participate in activities in which you could fall or become injured. ? Drive. ? Use heavy machinery. ? Drink alcohol. ? Take sleeping pills or medicines that cause drowsiness. ? Make important decisions or sign legal documents. ? Take care of children on your own.  Rest. Eating and drinking  Follow the diet that is recommended by your health care provider.  If you vomit, drink water, juice, or soup when you can drink without vomiting.  Make sure you have little or no nausea before eating solid foods. General instructions  Have a responsible adult stay with you until you are awake and alert.  Take over-the-counter and prescription medicines only as told by your health care provider.  If you smoke, do not smoke without supervision.  Keep all follow-up visits as told by your health care provider. This is important. Contact a health care provider if:  You keep feeling nauseous or you keep vomiting.  You feel light-headed.  You develop a rash.  You have a fever. Get help right away if:  You have trouble breathing. This information is not intended to replace advice given to you by your health care provider. Make sure you discuss any questions you have with your health care provider. Document Released: 08/13/2015 Document Revised: 12/13/2015 Document Reviewed: 08/13/2015 Elsevier Interactive Patient Education  [employer].

## 2018-02-03 ENCOUNTER — Encounter (HOSPITAL_COMMUNITY): Payer: Self-pay

## 2018-02-03 ENCOUNTER — Other Ambulatory Visit: Payer: Self-pay

## 2018-02-03 ENCOUNTER — Encounter (HOSPITAL_COMMUNITY)
Admission: RE | Admit: 2018-02-03 | Discharge: 2018-02-03 | Disposition: A | Discharge status: Home / Self Care | Payer: Medicare PPO | Source: Ambulatory Visit | Attending: Cardiology | Admitting: Cardiology

## 2018-02-03 DIAGNOSIS — Z7901 Long term (current) use of anticoagulants: Secondary | ICD-10-CM | POA: Diagnosis not present

## 2018-02-03 DIAGNOSIS — I5022 Chronic systolic (congestive) heart failure: Secondary | ICD-10-CM | POA: Diagnosis not present

## 2018-02-03 DIAGNOSIS — I442 Atrioventricular block, complete: Secondary | ICD-10-CM | POA: Diagnosis not present

## 2018-02-03 DIAGNOSIS — Z01812 Encounter for preprocedural laboratory examination: Secondary | ICD-10-CM

## 2018-02-03 DIAGNOSIS — E782 Mixed hyperlipidemia: Secondary | ICD-10-CM | POA: Diagnosis not present

## 2018-02-03 DIAGNOSIS — I252 Old myocardial infarction: Secondary | ICD-10-CM | POA: Diagnosis not present

## 2018-02-03 DIAGNOSIS — Z950 Presence of cardiac pacemaker: Secondary | ICD-10-CM | POA: Diagnosis not present

## 2018-02-03 DIAGNOSIS — I110 Hypertensive heart disease with heart failure: Secondary | ICD-10-CM | POA: Diagnosis not present

## 2018-02-03 DIAGNOSIS — I2510 Atherosclerotic heart disease of native coronary artery without angina pectoris: Secondary | ICD-10-CM | POA: Diagnosis not present

## 2018-02-03 DIAGNOSIS — I4891 Unspecified atrial fibrillation: Secondary | ICD-10-CM

## 2018-02-03 DIAGNOSIS — Z7982 Long term (current) use of aspirin: Secondary | ICD-10-CM | POA: Diagnosis not present

## 2018-02-03 DIAGNOSIS — Z951 Presence of aortocoronary bypass graft: Secondary | ICD-10-CM | POA: Diagnosis not present

## 2018-02-03 DIAGNOSIS — Z955 Presence of coronary angioplasty implant and graft: Secondary | ICD-10-CM | POA: Diagnosis not present

## 2018-02-03 HISTORY — DX: Presence of cardiac pacemaker: Z95.0

## 2018-02-03 LAB — CBC WITH DIFFERENTIAL/PLATELET
Basophils Absolute: 0 10*3/uL (ref 0.0–0.1)
Basophils Relative: 1 %
EOS ABS: 0.3 10*3/uL (ref 0.0–0.7)
EOS PCT: 4 %
HCT: 43.4 % (ref 39.0–52.0)
HEMOGLOBIN: 13.6 g/dL (ref 13.0–17.0)
LYMPHS ABS: 2.2 10*3/uL (ref 0.7–4.0)
Lymphocytes Relative: 24 %
MCH: 29.3 pg (ref 26.0–34.0)
MCHC: 31.3 g/dL (ref 30.0–36.0)
MCV: 93.5 fL (ref 78.0–100.0)
MONOS PCT: 6 %
Monocytes Absolute: 0.6 10*3/uL (ref 0.1–1.0)
Neutro Abs: 5.8 10*3/uL (ref 1.7–7.7)
Neutrophils Relative %: 65 %
Platelets: 163 10*3/uL (ref 150–400)
RBC: 4.64 MIL/uL (ref 4.22–5.81)
RDW: 13.5 % (ref 11.5–15.5)
WBC: 8.9 10*3/uL (ref 4.0–10.5)

## 2018-02-03 LAB — BASIC METABOLIC PANEL
Anion gap: 6 (ref 5–15)
BUN: 16 mg/dL (ref 8–23)
CHLORIDE: 108 mmol/L (ref 98–111)
CO2: 28 mmol/L (ref 22–32)
Calcium: 8.8 mg/dL — ABNORMAL LOW (ref 8.9–10.3)
Creatinine, Ser: 1 mg/dL (ref 0.61–1.24)
GFR calc Af Amer: >60 mL/min (ref >60)
GFR calc non Af Amer: >60 mL/min (ref >60)
Glucose, Bld: 113 mg/dL — ABNORMAL HIGH (ref 70–99)
Potassium: 3.9 mmol/L (ref 3.5–5.1)
SODIUM: 142 mmol/L (ref 135–145)

## 2018-02-04 ENCOUNTER — Encounter (HOSPITAL_COMMUNITY): Payer: Self-pay | Admitting: Anesthesiology

## 2018-02-04 ENCOUNTER — Encounter (HOSPITAL_COMMUNITY)
Admission: RE | Disposition: A | Discharge status: Home / Self Care | Payer: Self-pay | Source: Ambulatory Visit | Attending: Cardiology

## 2018-02-04 ENCOUNTER — Ambulatory Visit (HOSPITAL_COMMUNITY)
Admission: RE | Admit: 2018-02-04 | Discharge: 2018-02-04 | Disposition: A | Discharge status: Home / Self Care | Payer: Medicare PPO | Source: Ambulatory Visit | Attending: Cardiology | Admitting: Cardiology

## 2018-02-04 ENCOUNTER — Ambulatory Visit (HOSPITAL_COMMUNITY): Payer: Medicare PPO | Admitting: Anesthesiology

## 2018-02-04 DIAGNOSIS — I5022 Chronic systolic (congestive) heart failure: Secondary | ICD-10-CM | POA: Diagnosis not present

## 2018-02-04 DIAGNOSIS — I4891 Unspecified atrial fibrillation: Secondary | ICD-10-CM | POA: Insufficient documentation

## 2018-02-04 DIAGNOSIS — I110 Hypertensive heart disease with heart failure: Secondary | ICD-10-CM | POA: Diagnosis not present

## 2018-02-04 DIAGNOSIS — Z955 Presence of coronary angioplasty implant and graft: Secondary | ICD-10-CM | POA: Insufficient documentation

## 2018-02-04 DIAGNOSIS — E782 Mixed hyperlipidemia: Secondary | ICD-10-CM | POA: Diagnosis not present

## 2018-02-04 DIAGNOSIS — I252 Old myocardial infarction: Secondary | ICD-10-CM | POA: Diagnosis not present

## 2018-02-04 DIAGNOSIS — Z7982 Long term (current) use of aspirin: Secondary | ICD-10-CM | POA: Insufficient documentation

## 2018-02-04 DIAGNOSIS — I2510 Atherosclerotic heart disease of native coronary artery without angina pectoris: Secondary | ICD-10-CM | POA: Diagnosis not present

## 2018-02-04 DIAGNOSIS — Z7901 Long term (current) use of anticoagulants: Secondary | ICD-10-CM | POA: Diagnosis not present

## 2018-02-04 DIAGNOSIS — Z950 Presence of cardiac pacemaker: Secondary | ICD-10-CM | POA: Insufficient documentation

## 2018-02-04 DIAGNOSIS — I10 Essential (primary) hypertension: Secondary | ICD-10-CM | POA: Diagnosis not present

## 2018-02-04 DIAGNOSIS — Z951 Presence of aortocoronary bypass graft: Secondary | ICD-10-CM | POA: Diagnosis not present

## 2018-02-04 DIAGNOSIS — I442 Atrioventricular block, complete: Secondary | ICD-10-CM | POA: Insufficient documentation

## 2018-02-04 HISTORY — PX: CARDIOVERSION: SHX1299

## 2018-02-04 SURGERY — CARDIOVERSION
Anesthesia: Monitor Anesthesia Care

## 2018-02-04 MED ORDER — LACTATED RINGERS IV SOLN: INTRAVENOUS | Status: DC

## 2018-02-04 MED ORDER — PROPOFOL 500 MG/50ML IV EMUL
INTRAVENOUS | Status: DC | PRN
  Administered 2018-02-04: 150 ug/kg/min via INTRAVENOUS

## 2018-02-04 MED ORDER — HYDROMORPHONE HCL 1 MG/ML IJ SOLN: 0.2500 mg | INTRAMUSCULAR | Status: DC | PRN

## 2018-02-04 MED ORDER — PROMETHAZINE HCL 25 MG/ML IJ SOLN: 6.2500 mg | INTRAMUSCULAR | Status: DC | PRN

## 2018-02-04 MED ORDER — LACTATED RINGERS IV SOLN
INTRAVENOUS | Status: DC
  Administered 2018-02-04: 11:00:00 via INTRAVENOUS

## 2018-02-04 MED ORDER — PROPOFOL 10 MG/ML IV BOLUS
INTRAVENOUS | Status: DC | PRN
  Administered 2018-02-04: 40 mg via INTRAVENOUS

## 2018-02-04 MED ORDER — MEPERIDINE HCL 100 MG/ML IJ SOLN: 6.2500 mg | INTRAMUSCULAR | Status: DC | PRN

## 2018-02-04 MED ORDER — HYDROCODONE-ACETAMINOPHEN 7.5-325 MG PO TABS: 1.0000 | ORAL_TABLET | Freq: Once | ORAL | Status: DC | PRN

## 2018-02-04 NOTE — CV Procedure (Signed)
Procedure note  Procedure: cardioversion Physician: Dr Dina Rich MD Indication: Afib   Patient was brought to the procedure suite after appropriate consent was obtained. Defib pads were placed in the anterior and posterior positions. He was succesfully converted to sinus rhtyhm with a single synchronized 200J Shock. Telemetry strips show native P waves with V pacing, with intermittent atrial pacing. Cardiopulmonary monitoring was performed throughout the procedure, he tolerated well without complications   Dina Rich MD

## 2018-02-04 NOTE — H&P (Addendum)
Procedure H&P  Please see recent clinic note referenced below for full medical history. Patient presents for electrical cardioversion in the setting of persistent atrial fibrillation. I have personally verified with the patient that he has not missed any doses of his xarelto within the last 3 weeks.    Michael Melton       Clinical Summary Michael Melton is a 67 y.o.male seen today for follow up of the following medical problems.     1. CAD - hx of CABG in 2001, at [facility].  - GXT 09/2012 with no ischemic changes, did have frequent PVCs, 3 beat run of NSVT - echo 01/2013 LVEF 50%, grade I diastoilc dysfunction.  - echo 3/32018 LVEF 40%, grade I diastolic dysfunction - 06/2016 nuclear stress primarily scar with mild peri-infarct ischemia. LVEF 40%, intermediate risk.    - cath 08/2016: mid LAD occluded, mid LCX 80%, RCA 100%. OM 1 80%. OM2 99%. SVG-RPDA occluded, LIMA-LAD atretic and occluded in midportion, LAD fills from SVG-diag. SVG-D2 patent, RIMA-OM1 occluded, SVG-OM2 occluded. RCA territory fills by collaterals Received angiolpasty to LCX, OM1 80% receivded DES. Recs for indefinite plavix.   - plavix stopped 12/2017 admission after starting eliquis for afib -denies any recent chest pain.     2. Hyperlipidemia - he is compliant with statin  3, HTN -remains compliant with meds   4. Symptomatic bradycardia - s/p pacemaker placement 12/2017, [employer] BiV pacemaker for complete AV block - no recurrent symptoms.    5. Afib - new diagnosis during 12/2017 admission - prescribed xarelto but has not started yet, he wanted to discuss in clinic today.          Past Medical History:  Diagnosis Date  . AF (atrial fibrillation) (HCC) 12/29/2017  . CAD in native artery    08/05/16 Cath, atresia of LIMA to LAD, occlusion of SVG-->RCA, SVG-->Circumflex/OM2, SVG-->OM1, patent SVG to diag, DES to native circumflex.   . Erectile dysfunction   .  Hyperlipidemia, mixed   . Myocardial infarction [facility])    Out of hospital myocardial infarction/inferior wall, status post Cardiolite study 2007.  Ejection fraction 45%  . Unspecified essential hypertension           Allergies  Allergen Reactions  . Acetaminophen     REACTION: hallucinations           Current Outpatient Medications  Medication Sig Dispense Refill  . aspirin 81 MG tablet Take 81 mg by mouth daily.      Marland Kitchen atorvastatin (LIPITOR) 80 MG tablet TAKE 1 TABLET (80 MG TOTAL) BY MOUTH DAILY. 90 tablet 1  . lisinopril (PRINIVIL,ZESTRIL) 10 MG tablet Take 1 tablet (10 mg total) by mouth daily. 30 tablet 11  . metoprolol succinate (TOPROL-XL) 100 MG 24 hr tablet TAKE 1 TABLET (100 MG TOTAL) BY MOUTH DAILY. TAKE WITH OR IMMEDIATELY FOLLOWING A MEAL. 90 tablet 3  . Multiple Vitamin (MULTIVITAMIN) tablet Take 1 tablet by mouth daily as needed.     . nitroGLYCERIN (NITROSTAT) 0.4 MG SL tablet Place 0.4 mg under the tongue every 5 (five) minutes as needed.     . pantoprazole (PROTONIX) 40 MG tablet Take 1 tablet (40 mg total) by mouth daily. 30 tablet 1  . rivaroxaban (XARELTO) 20 MG TABS tablet Take 1 tablet (20 mg total) by mouth daily. 30 tablet 11  . sildenafil (REVATIO) 20 MG tablet Take 20-100 mg by mouth as needed (30 minutes prior to sexual activity).   6  No current facility-administered medications for this visit.           Past Surgical History:  Procedure Laterality Date  . BIV PACEMAKER INSERTION CRT-P N/A 12/25/2017   Procedure: BIV PACEMAKER INSERTION CRT-P;  Surgeon: Marinus Maw, Melton;  Location: [facility] INVASIVE CV LAB;  Service: Cardiovascular;  Laterality: N/A;  . CATARACT EXTRACTION    . CORONARY ARTERY BYPASS GRAFT     2001.  LIMA to LAD, SVG to OM1, SVG to OM 2, SVG to diagonal, SVG to PDA.  Marland Kitchen CORONARY BALLOON ANGIOPLASTY N/A 08/05/2016   Procedure: Coronary Balloon Angioplasty;  Surgeon: Corky Crafts, Melton;  Location: [facility]  INVASIVE CV LAB;  Service: Cardiovascular;  Laterality: N/A;  CFX  . CORONARY STENT INTERVENTION N/A 08/05/2016   Procedure: Coronary Stent Intervention;  Surgeon: Corky Crafts, Melton;  Location: [facility] INVASIVE CV LAB;  Service: Cardiovascular;  Laterality: N/A;  OM  . LAPAROSCOPIC CHOLECYSTECTOMY  06/22/2013   Dr. Cristy Folks  . LEFT HEART CATH AND CORS/GRAFTS ANGIOGRAPHY N/A 08/05/2016   Procedure: Left Heart Cath and Cors/Grafts Angiography;  Surgeon: Corky Crafts, Melton;  Location: [facility] INVASIVE CV LAB;  Service: Cardiovascular;  Laterality: N/A;          Allergies  Allergen Reactions  . Acetaminophen     REACTION: hallucinations           Family History  Problem Relation Age of Onset  . Alzheimer's disease Mother   . Brain cancer Mother        brain tumor  . Stroke Father   . Alzheimer's disease Father      Social History Michael Melton reports that he has never smoked. He has never used smokeless tobacco. Michael Melton reports that he drinks alcohol.   Review of Systems CONSTITUTIONAL: No weight loss, fever, chills, weakness or fatigue.  HEENT: Eyes: No visual loss, blurred vision, double vision or yellow sclerae.No hearing loss, sneezing, congestion, runny nose or sore throat.  SKIN: No rash or itching.  CARDIOVASCULAR: per hpi RESPIRATORY: No shortness of breath, cough or sputum.  GASTROINTESTINAL: No anorexia, nausea, vomiting or diarrhea. No abdominal pain or blood.  GENITOURINARY: No burning on urination, no polyuria NEUROLOGICAL: No headache, dizziness, syncope, paralysis, ataxia, numbness or tingling in the extremities. No change in bowel or bladder control.  MUSCULOSKELETAL: No muscle, back pain, joint pain or stiffness.  LYMPHATICS: No enlarged nodes. No history of splenectomy.  PSYCHIATRIC: No history of depression or anxiety.  ENDOCRINOLOGIC: No reports of sweating, cold or heat intolerance. No polyuria or polydipsia.  Marland Kitchen   Physical  Examination    Vitals:   01/07/18 1112  BP: (!) 147/87  Pulse: 69      Vitals:   01/07/18 1112  Weight: 219 lb (99.3 kg)  Height: 6\' 2"  (1.88 m)    Gen: resting comfortably, no acute distress HEENT: no scleral icterus, pupils equal round and reactive, no palptable cervical adenopathy,  CV: irreg, no m/r/g, no jvd Resp: Clear to auscultation bilaterally GI: abdomen is soft, non-tender, non-distended, normal bowel sounds, no hepatosplenomegaly MSK: extremities are warm, no edema.  Skin: warm, no rash Neuro:  no focal deficits Psych: appropriate affect   Diagnostic Studies 01/2013 Echo Study Conclusions  - Left ventricle: Systolic function is low normal, with an estimated EF of 50-55%, but closer to 50%. There is mild global hypokinesis. The cavity size was normal. Wall thickness was increased in a pattern of mild LVH. There was an increased relative contribution  of atrial contraction to ventricular filling. Doppler parameters are consistent with abnormal left ventricular relaxation (grade 1 diastolic dysfunction). - Ventricular septum: Motion consistent with post-operative changes. The contour showed diastolic flattening. - Left atrium: The atrium was mildly dilated.   09/2012 ETT Exc 6 min 50 sec, 10.1 METs. No specific ischemic changes, frequent PVCs and PACs, 3 beat run of NSVT.   06/2016 Nuclear stress test  ECG nondiagnostic due to IVCD. No arrhythmias noted during exercise.  Blood pressure demonstrated a hypertensive response to exercise.  Medium, moderate intensity, inferior/inferolateral defect extended from apex to base. There is a partially reversible zone in the inferolateral wall at the base. Most suggestive of scar with mild peri-infarct ischemia.  This is an intermediate risk study.  Nuclear stress EF: 40%. Consider confirmation with echocardiography.   07/2016 echo Study Conclusions  - Left ventricle: The cavity size was at the upper  limits of normal. Wall thickness was increased in a pattern of mild LVH. The estimated ejection fraction was 40%. Diffuse hypokinesis. There is akinesis of the basalinferior myocardium. Doppler parameters are consistent with abnormal left ventricular relaxation (grade 1 diastolic dysfunction). - Aortic valve: Mildly calcified annulus. Trileaflet. - Mitral valve: Mildly thickened leaflets . There was mild regurgitation. - Left atrium: The atrium was mildly dilated. - Tricuspid valve: There was trivial regurgitation. - Pulmonary arteries: Systolic pressure could not be accurately estimated. - Pericardium, extracardiac: There was no pericardial effusion.  Impressions:  - Mild LVH with upper normal chamber size and LVEF approximately 40%. There is diffuse hypokinesis with inferior basal akinesis. Probable grade 1 diastolic dysfunction. Mild left atrial enlargement. Mildly thickened mitral leaflets with mild mitral regurgitation.   08/2016 cath  Severe native three-vessel coronary artery disease.  Mid RCA lesion, 100 %stenosed. SVG to PDA is occluded. There are right to right and left to right collaterals.  Mid LAD lesion, 100 %stenosed. LIMA to LAD is atretic. LAD fills from flow from the SVG to diagonal filling the LAD.  Ost 2nd Mrg to 2nd Mrg lesion, 99 %stenosed. SVG to OM2 is occluded.  Ost 1st Mrg to 1st Mrg lesion, 80 %stenosed. Free RIMA graft to OM 1 is occluded. A STENT PROMUS PREM MR [id number] drug eluting stent was successfully placed., postdilated to > 4mm.  Post intervention, there is a 0% residual stenosis.  Mid Cx lesion, 80 %stenosed. THis was treated with initial cutting balloon angioplasty with a 2.5 mm balloon prior to stent placement in the OM. Then after stent placement, PTCA was done with a 2.0 balloon.  Post intervention, there is a 10% residual stenosis.  The left ventricular ejection fraction is 25-35% by visual estimate.  LV end  diastolic pressure is moderately elevated.  There is moderate to severe left ventricular systolic dysfunction.  There is no aortic valve stenosis.  Severe coronary artery disease. Severe graft disease. Only patent graft is the SVG to diagonal which feeds both the diagonal and the LAD. Successful stenting of a large OM1 and balloon angioplasty of the mid circumflex. The RCA territory fills by right to right and left to right collaterals. Continue aggressive secondary prevention. He'll need dual antiplatelet therapy for at least a year. Consider clopidogrel longer-term given the extent of his disease.     Assessment and Plan  1. CAD/Chronic systolic HF/ICM -no symptoms. Continue current medical therapy. He will be only on xarelto, no aspirin or plavix.   2. Hyperlipidemia -we will continue current statin.   3. HTN -elevated in  clinic, if persistent trend will need further titration of meds  4. Complete heart block - f/u with device clinic next week  5. Afib - he will start his xarelto today. F/u in 2/5 weeks for EKG check, if still in afib plan for DCCV. Also follow afib report from device check, if paroxysmal would not pursue DCCV - EKG today shows afib, V pacing.   F/u 3 months    Antoine Poche, M.D.

## 2018-02-04 NOTE — Anesthesia Procedure Notes (Signed)
Procedure Name: MAC Date/Time: 02/04/2018 11:19 AM Performed by: Andree Elk Amy A, CRNA Pre-anesthesia Checklist: Patient identified, Emergency Drugs available, Patient being monitored, Suction available and Timeout performed Oxygen Delivery Method: Simple face mask

## 2018-02-04 NOTE — Anesthesia Postprocedure Evaluation (Signed)
Anesthesia Post Note  Patient: Michael Melton  Procedure(s) Performed: CARDIOVERSION (N/A )  Patient location during evaluation: PACU Anesthesia Type: MAC Level of consciousness: awake and alert and oriented Pain management: pain level controlled Vital Signs Assessment: post-procedure vital signs reviewed and stable Respiratory status: spontaneous breathing Cardiovascular status: stable Postop Assessment: no apparent nausea or vomiting Anesthetic complications: no     Last Vitals:  Vitals:   02/04/18 1045 02/04/18 1050  BP:  (!) 160/104  Resp: 19 (!) 22  Temp:    SpO2: 96% 96%    Last Pain:  Vitals:   02/04/18 1030  PainSc: 0-No pain                 ADAMS, AMY A

## 2018-02-04 NOTE — Anesthesia Preprocedure Evaluation (Signed)
Anesthesia Evaluation    Airway Mallampati: II       Dental  (+) Teeth Intact, Dental Advidsory Given   Pulmonary           Cardiovascular hypertension, + CAD and + Past MI  + pacemaker      Neuro/Psych    GI/Hepatic   Endo/Other    Renal/GU      Musculoskeletal   Abdominal   Peds  Hematology   Anesthesia Other Findings CAD, h/o Afib, paced with MI hx H/OCABG,   Reproductive/Obstetrics                             Anesthesia Physical Anesthesia Plan  ASA: III  Anesthesia Plan: MAC   Post-op Pain Management:    Induction:   PONV Risk Score and Plan:   Airway Management Planned:   Additional Equipment:   Intra-op Plan:   Post-operative Plan:   Informed Consent:   Plan Discussed with: Anesthesiologist  Anesthesia Plan Comments:         Anesthesia Quick Evaluation

## 2018-02-04 NOTE — Discharge Instructions (Signed)
Electrical Cardioversion, Care After °This sheet gives you information about how to care for yourself after your procedure. Your health care provider may also give you more specific instructions. If you have problems or questions, contact your health care provider. °What can I expect after the procedure? °After the procedure, it is common to have: °· Some redness on the skin where the shocks were given. ° °Follow these instructions at home: °· Do not drive for 24 hours if you were given a medicine to help you relax (sedative). °· Take over-the-counter and prescription medicines only as told by your health care provider. °· Ask your health care provider how to check your pulse. Check it often. °· Rest for 48 hours after the procedure or as told by your health care provider. °· Avoid or limit your caffeine use as told by your health care provider. °Contact a health care provider if: °· You feel like your heart is beating too quickly or your pulse is not regular. °· You have a serious muscle cramp that does not go away. °Get help right away if: °· You have discomfort in your chest. °· You are dizzy or you feel faint. °· You have trouble breathing or you are short of breath. °· Your speech is slurred. °· You have trouble moving an arm or leg on one side of your body. °· Your fingers or toes turn cold or blue. °This information is not intended to replace advice given to you by your health care provider. Make sure you discuss any questions you have with your health care provider. °Document Released: 02/10/2013 Document Revised: 11/24/2015 Document Reviewed: 10/27/2015 °Elsevier Interactive Patient Education © 2018 [employer] ° °

## 2018-02-04 NOTE — Transfer of Care (Signed)
Immediate Anesthesia Transfer of Care Note  Patient: Michael Melton  Procedure(s) Performed: CARDIOVERSION (N/A )  Patient Location: PACU  Anesthesia Type:MAC  Level of Consciousness: awake, alert , oriented and patient cooperative  Airway & Oxygen Therapy: Patient Spontanous Breathing  Post-op Assessment: Report given to RN and Post -op Vital signs reviewed and stable  Post vital signs: Reviewed and stable  Last Vitals:  Vitals Value Taken Time  BP    Temp    Pulse 62 02/04/2018 11:45 AM  Resp 13 02/04/2018 11:45 AM  SpO2 96 % 02/04/2018 11:45 AM  Vitals shown include unvalidated device data.  Last Pain:  Vitals:   02/04/18 1030  PainSc: 0-No pain         Complications: No apparent anesthesia complications

## 2018-02-04 NOTE — Progress Notes (Signed)
Electrical Cardioversion Procedure Note SHONTE SODERLUND [id number] [birth year]  Procedure: Electrical Cardioversion Indications:  Atrial Fibrillation  Procedure Details Consent: Risks of procedure as well as the alternatives and risks of each were explained to the (patient/caregiver).  Consent for procedure obtained. Time Out: Verified patient identification, verified procedure, site/side was marked, verified correct patient position, special equipment/implants available, medications/allergies/relevent history reviewed, required imaging and test results available.  Performed  Patient placed on cardiac monitor, pulse oximetry, supplemental oxygen as necessary.  Sedation given: Hewitt Shorts CRNA MAC with propofol Pacer pads placed anterior and posterior chest.  Cardioverted 1 time(s).  Cardioverted at [place].  Evaluation Findings: Post procedure EKG shows: NSR  Av Paced per Dr Harl Bowie Complications: None Patient did tolerate procedure well.   Wilmer Floor 02/04/2018, 11:36 AM

## 2018-02-06 ENCOUNTER — Encounter (HOSPITAL_COMMUNITY): Payer: Self-pay | Admitting: Cardiology

## 2018-02-09 ENCOUNTER — Other Ambulatory Visit (HOSPITAL_COMMUNITY): Payer: Medicare PPO

## 2018-02-13 ENCOUNTER — Ambulatory Visit: Admit: 2018-02-13 | Payer: Medicare PPO | Admitting: Cardiology

## 2018-02-13 SURGERY — CARDIOVERSION
Anesthesia: Monitor Anesthesia Care

## 2018-03-05 ENCOUNTER — Ambulatory Visit: Payer: Medicare PPO | Admitting: Student

## 2018-03-18 DIAGNOSIS — Z950 Presence of cardiac pacemaker: Secondary | ICD-10-CM | POA: Insufficient documentation

## 2018-03-18 NOTE — Progress Notes (Signed)
Cardiology Office Note    Date:  03/23/2018   ID:  Michael Melton, DOB 05/09/1950, MRN [id number]  PCP:  Manon Hilding, MD  Cardiologist: No primary care provider on file. EPS: Cristopher Peru, MD  No chief complaint on file.   History of Present Illness:  Michael Melton is a 67 y.o. male history of CAD status post CABG in 2001, most recent nuclear stress test 06/2016 primarily scar with mild peri-infarct ischemia LVEF 40% intermediate risk.  Cardiac cath 08/2016 PTCA to the left circumflex, DES to OM1, recommendations to be on Plavix indefinitely but Plavix was stopped 12/2017 when he was admitted with atrial fibrillation.  He underwent [employer] biV pacemaker for complete AV block at the same time.  Patient was started on Xarelto and underwent DCCV 02/04/2018.  Patient comes in today accompanied by his wife.  Overall he feels well but complains of lower extremity edema.  He admits to eating out [place] and other high salt foods about twice a week.  He occasionally eats canned soup as well.  Says he could not tell when he was in atrial fibrillation.  Denies any chest pain shortness of breath dizziness or presyncope.    Past Medical History:  Diagnosis Date  . AF (atrial fibrillation) ([place]) 12/29/2017  . CAD in native artery    08/05/16 Cath, atresia of LIMA to LAD, occlusion of SVG-->RCA, SVG-->Circumflex/OM2, SVG-->OM1, patent SVG to diag, DES to native circumflex.   . Erectile dysfunction   . Hyperlipidemia, mixed   . Myocardial infarction [facility]) 2001   Out of hospital myocardial infarction/inferior wall, status post Cardiolite study 2007.  Ejection fraction 45%  . Pacemaker 12/2017  . Unspecified essential hypertension     Past Surgical History:  Procedure Laterality Date  . BIV PACEMAKER INSERTION CRT-P N/A 12/25/2017   Procedure: BIV PACEMAKER INSERTION CRT-P;  Surgeon: Evans Lance, MD;  Location: [place] [facility];  Service: Cardiovascular;  Laterality: N/A;  .  CARDIOVERSION N/A 02/04/2018   Procedure: CARDIOVERSION;  Surgeon: Arnoldo Lenis, MD;  Location: [facility];  Service: Endoscopy;  Laterality: N/A;  . CATARACT EXTRACTION    . CORONARY ARTERY BYPASS GRAFT  2001   2001.  LIMA to LAD, SVG to OM1, SVG to OM 2, SVG to diagonal, SVG to PDA.  Marland Kitchen CORONARY BALLOON ANGIOPLASTY N/A 08/05/2016   Procedure: Coronary Balloon Angioplasty;  Surgeon: Jettie Booze, MD;  Location: [place] [facility];  Service: Cardiovascular;  Laterality: N/A;  CFX  . CORONARY STENT INTERVENTION N/A 08/05/2016   Procedure: Coronary Stent Intervention;  Surgeon: Jettie Booze, MD;  Location: [place] [facility];  Service: Cardiovascular;  Laterality: N/A;  OM  . LAPAROSCOPIC CHOLECYSTECTOMY  06/22/2013   Dr. Truddie Hidden  . LEFT HEART CATH AND CORS/GRAFTS ANGIOGRAPHY N/A 08/05/2016   Procedure: Left Heart Cath and Cors/Grafts Angiography;  Surgeon: Jettie Booze, MD;  Location: [place] [facility];  Service: Cardiovascular;  Laterality: N/A;    Current Medications: Current Meds  Medication Sig  . atorvastatin (LIPITOR) 80 MG tablet TAKE 1 TABLET (80 MG TOTAL) BY MOUTH DAILY.  . clonazePAM (KLONOPIN) 0.5 MG tablet Take 0.5 mg by mouth at bedtime as needed for anxiety.  Marland Kitchen lisinopril (PRINIVIL,ZESTRIL) 10 MG tablet Take 1 tablet (10 mg total) by mouth daily.  . metoprolol succinate (TOPROL-XL) 100 MG 24 hr tablet TAKE 1 TABLET (100 MG TOTAL) BY MOUTH DAILY. TAKE WITH OR IMMEDIATELY FOLLOWING A MEAL.  Marland Kitchen  Multiple Vitamin (MULTIVITAMIN) tablet Take 1 tablet by mouth daily.   . nitroGLYCERIN (NITROSTAT) 0.4 MG SL tablet Place 0.4 mg under the tongue every 5 (five) minutes as needed for chest pain.   . pantoprazole (PROTONIX) 40 MG tablet TAKE 1 TABLET BY MOUTH EVERY DAY (Patient taking differently: Take 40 mg by mouth daily. )  . rivaroxaban (XARELTO) 20 MG TABS tablet Take 1 tablet (20 mg total) by mouth daily.  . sildenafil (REVATIO) 20 MG tablet Take 20-100 mg by  mouth as needed (30 minutes prior to sexual activity).      Allergies:   Acetaminophen   Social History   Socioeconomic History  . Marital status: Married    Spouse name: Not on file  . Number of children: Not on file  . Years of education: Not on file  . Highest education level: Not on file  Occupational History  . Occupation: Full time    Employer: [employer]  Social Needs  . Financial resource strain: Not on file  . Food insecurity:    Worry: Not on file    Inability: Not on file  . Transportation needs:    Medical: Not on file    Non-medical: Not on file  Tobacco Use  . Smoking status: Never Smoker  . Smokeless tobacco: Never Used  Substance and Sexual Activity  . Alcohol use: Yes    Alcohol/week: 0.0 standard drinks    Comment: RARE  . Drug use: No  . Sexual activity: Not on file  Lifestyle  . Physical activity:    Days per week: Not on file    Minutes per session: Not on file  . Stress: Not on file  Relationships  . Social connections:    Talks on phone: Not on file    Gets together: Not on file    Attends religious service: Not on file    Active member of club or organization: Not on file    Attends meetings of clubs or organizations: Not on file    Relationship status: Not on file  Other Topics Concern  . Not on file  Social History Narrative  . Not on file     Family History:  The patient's family history includes Alzheimer's disease in his father and mother; Brain cancer in his mother; Stroke in his father.   ROS:   Please see the history of present illness.    Review of Systems  Constitution: Negative.  HENT: Negative.   Cardiovascular: Positive for leg swelling.  Respiratory: Negative.   Endocrine: Negative.   Hematologic/Lymphatic: Negative.   Musculoskeletal: Negative.   Gastrointestinal: Negative.   Genitourinary: Negative.   Neurological: Negative.    All other systems reviewed and are negative.   PHYSICAL EXAM:   VS:  BP 138/80  (BP Location: Right Arm)   Pulse 93   Ht _0  (1.88 m)   Wt 225 lb (102.1 kg)   SpO2 96%   BMI 28.89 kg/m   Physical Exam  GEN: Well nourished, well developed, in no acute distress  Neck: no JVD, carotid bruits, or masses Cardiac:RRR; no murmurs, rubs, or gallops  Respiratory:  clear to auscultation bilaterally, normal work of breathing GI: soft, nontender, nondistended, + BS Ext: +1 edema bilaterally with brawny changes decreased distal pulses bilaterally Neuro:  Alert and Oriented x 3 Psych: euthymic mood, full affect  Wt Readings from Last 3 Encounters:  03/23/18 225 lb (102.1 kg)  02/03/18 227 lb 8 oz (103.2 kg)  01/07/18 219 lb (99.3 kg)      Studies/Labs Reviewed:   EKG:  EKG is  ordered today.  The ekg ordered today demonstrates atrial paced  Recent Labs: 02/03/2018: BUN 16; Creatinine, Ser 1.00; Hemoglobin 13.6; Platelets 163; Potassium 3.9; Sodium 142   Lipid Panel No results found for: CHOL, TRIG, HDL, CHOLHDL, VLDL, LDLCALC, LDLDIRECT  Additional studies/ records that were reviewed today include:  06/2016 Nuclear stress test  ECG nondiagnostic due to IVCD. No arrhythmias noted during exercise.  Blood pressure demonstrated a hypertensive response to exercise.  Medium, moderate intensity, inferior/inferolateral defect extended from apex to base. There is a partially reversible zone in the inferolateral wall at the base. Most suggestive of scar with mild peri-infarct ischemia.  This is an intermediate risk study.  Nuclear stress EF: 40%. Consider confirmation with echocardiography.     07/2016 echo Study Conclusions   - Left ventricle: The cavity size was at the upper limits of   normal. Wall thickness was increased in a pattern of mild LVH.   The estimated ejection fraction was 40%. Diffuse hypokinesis.   There is akinesis of the basalinferior myocardium. Doppler   parameters are consistent with abnormal left ventricular   relaxation (grade 1 diastolic  dysfunction). - Aortic valve: Mildly calcified annulus. Trileaflet. - Mitral valve: Mildly thickened leaflets . There was mild   regurgitation. - Left atrium: The atrium was mildly dilated. - Tricuspid valve: There was trivial regurgitation. - Pulmonary arteries: Systolic pressure could not be accurately   estimated. - Pericardium, extracardiac: There was no pericardial effusion.   Impressions:   - Mild LVH with upper normal chamber size and LVEF approximately   40%. There is diffuse hypokinesis with inferior basal akinesis.   Probable grade 1 diastolic dysfunction. Mild left atrial   enlargement. Mildly thickened mitral leaflets with mild mitral   regurgitation.     08/2016 cath  Severe native three-vessel coronary artery disease.  Mid RCA lesion, 100 %stenosed. SVG to PDA is occluded. There are right to right and left to right collaterals.  Mid LAD lesion, 100 %stenosed. LIMA to LAD is atretic. LAD fills from flow from the SVG to diagonal filling the LAD.  Ost 2nd Mrg to 2nd Mrg lesion, 99 %stenosed. SVG to OM2 is occluded.  Ost 1st Mrg to 1st Mrg lesion, 80 %stenosed. Free RIMA graft to OM 1 is occluded. A STENT PROMUS PREM MR 3.5X28 drug eluting stent was successfully placed., postdilated to > 48m.  Post intervention, there is a 0% residual stenosis.  Mid Cx lesion, 80 %stenosed. THis was treated with initial cutting balloon angioplasty with a 2.5 mm balloon prior to stent placement in the OM. Then after stent placement, PTCA was done with a 2.0 balloon.  Post intervention, there is a 10% residual stenosis.  The left ventricular ejection fraction is 25-35% by visual estimate.  LV end diastolic pressure is moderately elevated.  There is moderate to severe left ventricular systolic dysfunction.  There is no aortic valve stenosis.   Severe coronary artery disease. Severe graft disease. Only patent graft is the SVG to diagonal which feeds both the diagonal and the LAD.  Successful stenting of a large OM1 and balloon angioplasty of the mid circumflex. The RCA territory fills by right to right and left to right collaterals. Continue aggressive secondary prevention. He'll need dual antiplatelet therapy for at least a year. Consider clopidogrel longer-term given the extent of his disease.  ASSESSMENT:    1. Atrial fibrillation, unspecified type ([place])   2. Atherosclerosis of native coronary artery of native heart without angina pectoris   3. Chronic combined systolic and diastolic heart failure ([place])   4. Essential hypertension   5. Hyperlipidemia, unspecified hyperlipidemia type   6. Status post biventricular pacemaker      PLAN:  In order of problems listed above:  Atrial fibrillation on Xarelto status post DCCV 02/04/2018 EKG looks like normal sinus rhythm today.  CAD status post CABG in 2001, mild peri-infarct ischemia on stress test in 06/2016, PTCA to the left circumflex and DES to the OM1 08/2016.  No longer on Plavix or aspirin because of Xarelto.  Acute on chronic combined systolic and diastolic CHF LVEF 41% 05/4641 with lower extremity edema.  He is getting extra salt in his diet.  Recommend 2 g sodium diet.  Will begin low-dose Lasix 20 mg once daily potassium 10 mEq once daily.  Repeat be met in 2 weeks.  Update 2D echo.  Follow-up with Dr. Harl Bowie in [place] in December as scheduled.  Essential hypertension blood pressure normal today has been elevated recently  Hyperlipidemia on Lipitor 80 mg daily  Status post biV ventricular pacemaker followed by Dr. Rogelia Boga in March    Medication Adjustments/Labs and Tests Ordered: Current medicines are reviewed at length with the patient today.  Concerns regarding medicines are outlined above.  Medication changes, Labs and Tests ordered today are listed in the Patient Instructions below. There are no Patient Instructions on file for this visit.   Sumner Boast, PA-C  03/23/2018  11:01 AM    [facility] [address], [place], [place]  [address] Phone: [phone]; Fax: [phone]

## 2018-03-23 ENCOUNTER — Encounter: Payer: Self-pay | Admitting: Physician Assistant

## 2018-03-23 ENCOUNTER — Ambulatory Visit: Payer: Medicare PPO | Admitting: Physician Assistant

## 2018-03-23 VITALS — BP 138/80 | HR 93 | Ht 74.0 in | Wt 225.0 lb

## 2018-03-23 DIAGNOSIS — I5042 Chronic combined systolic (congestive) and diastolic (congestive) heart failure: Secondary | ICD-10-CM | POA: Diagnosis not present

## 2018-03-23 DIAGNOSIS — I2510 Atherosclerotic heart disease of native coronary artery without angina pectoris: Secondary | ICD-10-CM

## 2018-03-23 DIAGNOSIS — I10 Essential (primary) hypertension: Secondary | ICD-10-CM

## 2018-03-23 DIAGNOSIS — I4891 Unspecified atrial fibrillation: Secondary | ICD-10-CM | POA: Diagnosis not present

## 2018-03-23 DIAGNOSIS — E785 Hyperlipidemia, unspecified: Secondary | ICD-10-CM | POA: Diagnosis not present

## 2018-03-23 DIAGNOSIS — Z950 Presence of cardiac pacemaker: Secondary | ICD-10-CM | POA: Diagnosis not present

## 2018-03-23 MED ORDER — POTASSIUM CHLORIDE ER 10 MEQ PO TBCR: 10.0000 meq | EXTENDED_RELEASE_TABLET | Freq: Every day | ORAL | 3 refills | Status: DC

## 2018-03-23 MED ORDER — FUROSEMIDE 20 MG PO TABS: 20.0000 mg | ORAL_TABLET | Freq: Every day | ORAL | 3 refills | Status: DC

## 2018-03-23 NOTE — Patient Instructions (Signed)
Medication Instructions:  START Lasix 20 mg daily  START Potassium 10 meq daily If you need a refill on your cardiac medications before your next appointment, please call your pharmacy.   Lab work: BMET in 2 weeks  If you have labs (blood work) drawn today and your tests are completely normal, you will receive your results only by: Marland Kitchen. MyChart Message (if you have MyChart) OR . A paper copy in the mail If you have any lab test that is abnormal or we need to change your treatment, we will call you to review the results.  Testing/Procedures:Your physician has requested that you have an echocardiogram. Echocardiography is a painless test that uses sound waves to create images of your heart. It provides your doctor with information about the size and shape of your heart and how well your heart's chambers and valves are working. This procedure takes approximately one hour. There are no restrictions for this procedure.    Follow-Up: Keep 04/13/18 apt at 9:40 am [facility]  Any Other Special Instructions Will Be Listed Below (If Applicable). Follow 2 Gram sodium diet provided for you today

## 2018-04-01 ENCOUNTER — Ambulatory Visit (INDEPENDENT_AMBULATORY_CARE_PROVIDER_SITE_OTHER): Payer: Medicare PPO

## 2018-04-01 ENCOUNTER — Other Ambulatory Visit: Payer: Self-pay

## 2018-04-01 DIAGNOSIS — I5042 Chronic combined systolic (congestive) and diastolic (congestive) heart failure: Secondary | ICD-10-CM | POA: Diagnosis not present

## 2018-04-07 ENCOUNTER — Telehealth: Payer: Self-pay

## 2018-04-07 NOTE — Telephone Encounter (Signed)
I spoke with patient, gave results, confirmed he is following low sodium diet and confirmed f/u apt

## 2018-04-07 NOTE — Telephone Encounter (Signed)
-----   Message from Dyann KiefMichele M Lenze, PA-C sent at 04/05/2018  5:15 PM EST ----- Echo shows further reduction in heart function. Was 40% now only 25-30% and has trouble relaxing. Keep f/u with Dr. Wyline MoodBranch on 12/9 and he can discuss further and change any meds if indicated. Please follow 2 gram sodium diet.

## 2018-04-09 ENCOUNTER — Encounter: Payer: Medicare PPO | Admitting: Internal Medicine

## 2018-04-13 ENCOUNTER — Ambulatory Visit (INDEPENDENT_AMBULATORY_CARE_PROVIDER_SITE_OTHER): Payer: Medicare PPO | Admitting: Cardiology

## 2018-04-13 ENCOUNTER — Encounter: Payer: Self-pay | Admitting: Cardiology

## 2018-04-13 VITALS — BP 142/86 | HR 71 | Ht 74.0 in | Wt 222.4 lb

## 2018-04-13 DIAGNOSIS — E782 Mixed hyperlipidemia: Secondary | ICD-10-CM

## 2018-04-13 DIAGNOSIS — I5022 Chronic systolic (congestive) heart failure: Secondary | ICD-10-CM

## 2018-04-13 DIAGNOSIS — I4891 Unspecified atrial fibrillation: Secondary | ICD-10-CM

## 2018-04-13 DIAGNOSIS — I442 Atrioventricular block, complete: Secondary | ICD-10-CM

## 2018-04-13 DIAGNOSIS — I10 Essential (primary) hypertension: Secondary | ICD-10-CM

## 2018-04-13 DIAGNOSIS — I2510 Atherosclerotic heart disease of native coronary artery without angina pectoris: Secondary | ICD-10-CM

## 2018-04-13 MED ORDER — SACUBITRIL-VALSARTAN 24-26 MG PO TABS: 1.0000 | ORAL_TABLET | Freq: Two times a day (BID) | ORAL | 3 refills | Status: DC

## 2018-04-13 NOTE — Patient Instructions (Signed)
Your physician recommends that you schedule a follow-up appointment in: 3-4 WEEKS [facility]  Your physician has recommended you make the following change in your medication:   STOP LISINOPRIL   ON Thursday 04/16/18 START ENTRESTO 24/26 MG TWICE DAILY  Your physician recommends that you return for lab work in: 2 WEEKS BMP/TSH/MG  [facility]!!

## 2018-04-13 NOTE — Progress Notes (Signed)
Clinical Summary Mr. Bollard is a 67 y.o.male seen today for follow up of the following medical problems.     1. CAD/ICM - hx of CABG in 2001, at [facility].  - GXT 09/2012 with no ischemic changes, did have frequent PVCs, 3 beat run of NSVT - echo 01/2013 LVEF 50%, grade I diastoilc dysfunction.  - echo 07/2016 LVEF 40%, grade I diastolic dysfunction - 06/2016 nuclear stress primarily scar with mild peri-infarct ischemia. LVEF 40%, intermediate risk.    - cath 08/2016: mid LAD occluded, mid LCX 80%, RCA 100%. OM 1 80%. OM2 99%. SVG-RPDA occluded, LIMA-LAD atretic and occluded in midportion, LAD fills from SVG-diag. SVG-D2 patent, RIMA-OM1 occluded, SVG-OM2 occluded. RCA territory fills by collaterals Received angiolpasty to LCX, OM1 80% receivded DES. Recs for indefinite plavix.  03/2018 echo LVEF 25-30%, diffuse hypokinesis, grade II diastoilc dysfunction, mild MR - no recent SOB/DOE, no recent edema.     2. Hyperlipidemia -compliant with statin  3, HTN -he is compliant with meds   4. Symptomatic bradycardia - s/p pacemaker placement 12/2017, [employer] BiV pacemaker for complete AV block - followed by EP, no recent symptoms.    5. Afib - new diagnosis during 12/2017 admission  - s/p DCCV 02/04/18. EKG today shows back in afib rate controlled.  - denies any palpitations - no bleeding on xarelto.   Past Medical History:  Diagnosis Date  . AF (atrial fibrillation) (HCC) 12/29/2017  . CAD in native artery    08/05/16 Cath, atresia of LIMA to LAD, occlusion of SVG-->RCA, SVG-->Circumflex/OM2, SVG-->OM1, patent SVG to diag, DES to native circumflex.   . Erectile dysfunction   . Hyperlipidemia, mixed   . Myocardial infarction [facility]) 2001   Out of hospital myocardial infarction/inferior wall, status post Cardiolite study 2007.  Ejection fraction 45%  . Pacemaker 12/2017  . Unspecified essential hypertension      Allergies  Allergen Reactions  . Acetaminophen  Other (See Comments)    Hallucinations     Current Outpatient Medications  Medication Sig Dispense Refill  . atorvastatin (LIPITOR) 80 MG tablet TAKE 1 TABLET (80 MG TOTAL) BY MOUTH DAILY. 90 tablet 1  . clonazePAM (KLONOPIN) 0.5 MG tablet Take 0.5 mg by mouth at bedtime as needed for anxiety.    . furosemide (LASIX) 20 MG tablet Take 1 tablet (20 mg total) by mouth daily. 90 tablet 3  . lisinopril (PRINIVIL,ZESTRIL) 10 MG tablet Take 1 tablet (10 mg total) by mouth daily. 30 tablet 11  . metoprolol succinate (TOPROL-XL) 100 MG 24 hr tablet TAKE 1 TABLET (100 MG TOTAL) BY MOUTH DAILY. TAKE WITH OR IMMEDIATELY FOLLOWING A MEAL. 90 tablet 3  . Multiple Vitamin (MULTIVITAMIN) tablet Take 1 tablet by mouth daily.     . nitroGLYCERIN (NITROSTAT) 0.4 MG SL tablet Place 0.4 mg under the tongue every 5 (five) minutes as needed for chest pain.     . pantoprazole (PROTONIX) 40 MG tablet TAKE 1 TABLET BY MOUTH EVERY DAY (Patient taking differently: Take 40 mg by mouth daily. ) 30 tablet 6  . potassium chloride (K-DUR) 10 MEQ tablet Take 1 tablet (10 mEq total) by mouth daily. 90 tablet 3  . rivaroxaban (XARELTO) 20 MG TABS tablet Take 1 tablet (20 mg total) by mouth daily. 30 tablet 11  . sildenafil (REVATIO) 20 MG tablet Take 20-100 mg by mouth as needed (30 minutes prior to sexual activity).   6   No current facility-administered medications for this  visit.      Past Surgical History:  Procedure Laterality Date  . BIV PACEMAKER INSERTION CRT-P N/A 12/25/2017   Procedure: BIV PACEMAKER INSERTION CRT-P;  Surgeon: Marinus Mawaylor, Gregg W, MD;  [facility];  Service: Cardiovascular;  Laterality: N/A;  . CARDIOVERSION N/A 02/04/2018   Procedure: CARDIOVERSION;  Surgeon: Antoine PocheBranch, Chardonay Scritchfield F, MD;  Location: [facility] ENDO SUITE;  Service: Endoscopy;  Laterality: N/A;  . CATARACT EXTRACTION    . CORONARY ARTERY BYPASS GRAFT  2001   2001.  LIMA to LAD, SVG to OM1, SVG to OM 2, SVG to diagonal, SVG to  PDA.  Marland Kitchen. CORONARY BALLOON ANGIOPLASTY N/A 08/05/2016   Procedure: Coronary Balloon Angioplasty;  Surgeon: Corky CraftsJayadeep S Varanasi, MD;  [facility];  Service: Cardiovascular;  Laterality: N/A;  CFX  . CORONARY STENT INTERVENTION N/A 08/05/2016   Procedure: Coronary Stent Intervention;  Surgeon: Corky CraftsJayadeep S Varanasi, MD;  [facility];  Service: Cardiovascular;  Laterality: N/A;  OM  . LAPAROSCOPIC CHOLECYSTECTOMY  06/22/2013   Dr. Cristy FolksBeacham  . LEFT HEART CATH AND CORS/GRAFTS ANGIOGRAPHY N/A 08/05/2016   Procedure: Left Heart Cath and Cors/Grafts Angiography;  Surgeon: Corky CraftsJayadeep S Varanasi, MD;  [facility];  Service: Cardiovascular;  Laterality: N/A;     Allergies  Allergen Reactions  . Acetaminophen Other (See Comments)    Hallucinations      Family History  Problem Relation Age of Onset  . Alzheimer's disease Mother   . Brain cancer Mother        brain tumor  . Stroke Father   . Alzheimer's disease Father      Social History Mr. Leavens reports that he has never smoked. He has never used smokeless tobacco. [name] reports that he drinks alcohol.   Review of Systems CONSTITUTIONAL: No weight loss, fever, chills, weakness or fatigue.  HEENT: Eyes: No visual loss, blurred vision, double vision or yellow sclerae.No hearing loss, sneezing, congestion, runny nose or sore throat.  SKIN: No rash or itching.  CARDIOVASCULAR: per hpi RESPIRATORY: No shortness of breath, cough or sputum.  GASTROINTESTINAL: No anorexia, nausea, vomiting or diarrhea. No abdominal pain or blood.  GENITOURINARY: No burning on urination, no polyuria NEUROLOGICAL: No headache, dizziness, syncope, paralysis, ataxia, numbness or tingling in the extremities. No change in bowel or bladder control.  MUSCULOSKELETAL: No muscle, back pain, joint pain or stiffness.  LYMPHATICS: No enlarged nodes. No history of splenectomy.  PSYCHIATRIC: No history of depression or anxiety.    ENDOCRINOLOGIC: No reports of sweating, cold or heat intolerance. No polyuria or polydipsia.  Marland Kitchen.   Physical Examination Vitals:   04/13/18 0930  BP: (!) 142/86  Pulse: 71  SpO2: 96%   Vitals:   04/13/18 0930  Weight: 222 lb 6.4 oz (100.9 kg)  Height: 6\' 2"  (1.88 m)    Gen: resting comfortably, no acute distress HEENT: no scleral icterus, pupils equal round and reactive, no palptable cervical adenopathy,  CV: RRR, no mrg, no jvd Resp: Clear to auscultation bilaterally GI: abdomen is soft, non-tender, non-distended, normal bowel sounds, no hepatosplenomegaly MSK: extremities are warm, trace bilateral edema Skin: warm, no rash Neuro:  no focal deficits Psych: appropriate affect   Diagnostic Studies 01/2013 Echo Study Conclusions  - Left ventricle: Systolic function is low normal, with an estimated EF of 50-55%, but closer to 50%. There is mild global hypokinesis. The cavity size was normal. Wall thickness was increased in a pattern of mild LVH.  There was an increased relative contribution of atrial contraction to ventricular filling. Doppler parameters are consistent with abnormal left ventricular relaxation (grade 1 diastolic dysfunction). - Ventricular septum: Motion consistent with post-operative changes. The contour showed diastolic flattening. - Left atrium: The atrium was mildly dilated.   09/2012 ETT Exc 6 min 50 sec, 10.1 METs. No specific ischemic changes, frequent PVCs and PACs, 3 beat run of NSVT.   06/2016 Nuclear stress test  ECG nondiagnostic due to IVCD. No arrhythmias noted during exercise.  Blood pressure demonstrated a hypertensive response to exercise.  Medium, moderate intensity, inferior/inferolateral defect extended from apex to base. There is a partially reversible zone in the inferolateral wall at the base. Most suggestive of scar with mild peri-infarct ischemia.  This is an intermediate risk study.  Nuclear stress EF: 40%. Consider  confirmation with echocardiography.   07/2016 echo Study Conclusions  - Left ventricle: The cavity size was at the upper limits of normal. Wall thickness was increased in a pattern of mild LVH. The estimated ejection fraction was 40%. Diffuse hypokinesis. There is akinesis of the basalinferior myocardium. Doppler parameters are consistent with abnormal left ventricular relaxation (grade 1 diastolic dysfunction). - Aortic valve: Mildly calcified annulus. Trileaflet. - Mitral valve: Mildly thickened leaflets . There was mild regurgitation. - Left atrium: The atrium was mildly dilated. - Tricuspid valve: There was trivial regurgitation. - Pulmonary arteries: Systolic pressure could not be accurately estimated. - Pericardium, extracardiac: There was no pericardial effusion.  Impressions:  - Mild LVH with upper normal chamber size and LVEF approximately 40%. There is diffuse hypokinesis with inferior basal akinesis. Probable grade 1 diastolic dysfunction. Mild left atrial enlargement. Mildly thickened mitral leaflets with mild mitral regurgitation.   08/2016 cath  Severe native three-vessel coronary artery disease.  Mid RCA lesion, 100 %stenosed. SVG to PDA is occluded. There are right to right and left to right collaterals.  Mid LAD lesion, 100 %stenosed. LIMA to LAD is atretic. LAD fills from flow from the SVG to diagonal filling the LAD.  Ost 2nd Mrg to 2nd Mrg lesion, 99 %stenosed. SVG to OM2 is occluded.  Ost 1st Mrg to 1st Mrg lesion, 80 %stenosed. Free RIMA graft to OM 1 is occluded. A STENT PROMUS PREM MR 3.5X28 drug eluting stent was successfully placed., postdilated to > 4mm.  Post intervention, there is a 0% residual stenosis.  Mid Cx lesion, 80 %stenosed. THis was treated with initial cutting balloon angioplasty with a 2.5 mm balloon prior to stent placement in the OM. Then after stent placement, PTCA was done with a 2.0 balloon.  Post  intervention, there is a 10% residual stenosis.  The left ventricular ejection fraction is 25-35% by visual estimate.  LV end diastolic pressure is moderately elevated.  There is moderate to severe left ventricular systolic dysfunction.  There is no aortic valve stenosis.  Severe coronary artery disease. Severe graft disease. Only patent graft is the SVG to diagonal which feeds both the diagonal and the LAD. Successful stenting of a large OM1 and balloon angioplasty of the mid circumflex. The RCA territory fills by right to right and left to right collaterals. Continue aggressive secondary prevention. He'll need dual antiplatelet therapy for at least a year. Consider clopidogrel longer-term given the extent of his disease.      Assessment and Plan  1. CAD/Chronic systolic HF/ICM -recent further decrease in his LVEF - we will stop lisinopril, after 48 hrs start entresto 24/26mg  bid, BMET in 2 weeks. Titrate beta  blocker and entresto over follow ups - discussed repeat cath given his history of CAD/ICM and drop in LVEF, we will wait until after the holidays. No significant symptoms and thus no great urgency. Fairly new diagnosis of afib, by device checks his rates have been controlled and thus don't think drop in LVEF is afib related.  - off ASA/plavix since on xarelto.   2. Hyperlipidemia -continue statin  3. HTN -above goal in clinic, follow with starting entesto  4. Complete heart block - continue to follow in device clinic.   5. Afib - s/p DCCV, EKG shows he is back in afib. He is rate controlled and asymptomatic, no strong indication to continue to pursue a rhythm strategy. Continue rate control - continue xarelto.    F/u 3-4 weeks   Antoine Poche, M.D.

## 2018-04-14 NOTE — Addendum Note (Signed)
Addended by: Burman NievesASHWORTH, Catalia Massett T on: 04/14/2018 11:05 AM   Modules accepted: Orders

## 2018-04-16 ENCOUNTER — Other Ambulatory Visit: Payer: Self-pay | Admitting: *Deleted

## 2018-04-16 ENCOUNTER — Telehealth: Payer: Self-pay | Admitting: *Deleted

## 2018-04-16 MED ORDER — SACUBITRIL-VALSARTAN 24-26 MG PO TABS: 1.0000 | ORAL_TABLET | Freq: Two times a day (BID) | ORAL | 0 refills | Status: DC

## 2018-04-16 NOTE — Telephone Encounter (Signed)
[facility] [facility]26 [facility] through 05/06/19 - ID [id number] - letter scanned into chart

## 2018-04-16 NOTE — Telephone Encounter (Signed)
Patient said the 30 day free voucher for entrestro wouldn't work for [facility]. [facility]

## 2018-05-04 ENCOUNTER — Telehealth: Payer: Self-pay | Admitting: Cardiology

## 2018-05-04 DIAGNOSIS — I5042 Chronic combined systolic (congestive) and diastolic (congestive) heart failure: Secondary | ICD-10-CM | POA: Diagnosis not present

## 2018-05-04 NOTE — Telephone Encounter (Signed)
Informed pt that it is ok to have CAT w/ his device. Pt verbalized understanding.

## 2018-05-04 NOTE — Telephone Encounter (Signed)
Can patient have CAT scan with his pacemaker

## 2018-05-04 NOTE — Telephone Encounter (Signed)
Will defer to device clinic.

## 2018-05-11 ENCOUNTER — Telehealth: Payer: Self-pay | Admitting: *Deleted

## 2018-05-11 NOTE — Telephone Encounter (Signed)
-----   Message from Laqueta Linden, MD sent at 05/08/2018  1:32 PM EST ----- Normal renal function, thyroid function, and magnesium level.

## 2018-05-11 NOTE — Telephone Encounter (Signed)
LM to return call.

## 2018-05-14 ENCOUNTER — Telehealth: Payer: Self-pay

## 2018-05-14 MED ORDER — SACUBITRIL-VALSARTAN 24-26 MG PO TABS: 1.0000 | ORAL_TABLET | Freq: Two times a day (BID) | ORAL | 6 refills | Status: DC

## 2018-05-14 NOTE — Telephone Encounter (Signed)
Pt aware to remain on Entresto - Medication sent to pharmacy.

## 2018-05-14 NOTE — Telephone Encounter (Signed)
Patient called wanting to know if he needs to stay on Entresto.  If so, [facility] needs [facility]

## 2018-05-14 NOTE — Telephone Encounter (Signed)
Pt aware - routed to pcp  

## 2018-05-19 ENCOUNTER — Encounter: Payer: Self-pay | Admitting: Cardiology

## 2018-05-19 ENCOUNTER — Ambulatory Visit: Payer: Medicare PPO | Admitting: Cardiology

## 2018-05-19 VITALS — BP 127/68 | HR 69 | Ht 74.0 in | Wt 216.8 lb

## 2018-05-19 DIAGNOSIS — I4891 Unspecified atrial fibrillation: Secondary | ICD-10-CM

## 2018-05-19 DIAGNOSIS — E782 Mixed hyperlipidemia: Secondary | ICD-10-CM | POA: Diagnosis not present

## 2018-05-19 DIAGNOSIS — I10 Essential (primary) hypertension: Secondary | ICD-10-CM | POA: Diagnosis not present

## 2018-05-19 DIAGNOSIS — I5022 Chronic systolic (congestive) heart failure: Secondary | ICD-10-CM

## 2018-05-19 MED ORDER — SACUBITRIL-VALSARTAN 49-51 MG PO TABS: 1.0000 | ORAL_TABLET | Freq: Two times a day (BID) | ORAL | 0 refills | Status: DC

## 2018-05-19 MED ORDER — SACUBITRIL-VALSARTAN 49-51 MG PO TABS: 1.0000 | ORAL_TABLET | Freq: Two times a day (BID) | ORAL | 3 refills | Status: DC

## 2018-05-19 NOTE — Patient Instructions (Signed)
Your physician recommends that you schedule a follow-up appointment in: 2 WEEKS WITH NURSE FOR VITALS CHECK AND PENDING WITH DR [facility]  Your physician has recommended you make the following change in your medication:   INCREASE ENTRESTO 49/51 MG TWICE DAILY   Your physician recommends that you return for lab work in: 2 WEEKS BMP - ORDERS GIVEN TODAY  Thank you for choosing [facility]!!

## 2018-05-19 NOTE — Progress Notes (Signed)
Clinical Summary Michael Melton is a 68 y.o.male  seen today for follow up of the following medical problems.   1. CAD/ICM - hx of CABG in 2001, at [facility].  - GXT 09/2012 with no ischemic changes, did have frequent PVCs, 3 beat run of NSVT - echo 01/2013 LVEF 50%, grade I diastoilc dysfunction.  - echo 07/2016 LVEF 40%, grade I diastolic dysfunction - 06/2016 nuclear stress primarily scar with mild peri-infarct ischemia. LVEF 40%, intermediate risk.    - cath 08/2016: mid LAD occluded, mid LCX 80%, RCA 100%. OM 1 80%. OM2 99%. SVG-RPDA occluded, LIMA-LAD atretic and occluded in midportion, LAD fills from SVG-diag. SVG-D2 patent, RIMA-OM1 occluded, SVG-OM2 occluded. RCA territory fills by collaterals Received angiolpasty to LCX, OM1 80% receivded DES. Recs for indefinite plavix.  03/2018 echo LVEF 25-30%, diffuse hypokinesis, grade II diastoilc dysfunction, mild MR - no recent SOB/DOE, no recent edema.    - last visit we changed lisinopril to entresto - no SOB/DOE. No recent chest pain - compliant with meds - we discussed repeat cath last visit due to drop in LVEF however he was reluctant at the time.     2. Hyperlipidemia -he is compliant with statin  3, HTN -compliant with meds   4. Symptomatic bradycardia - s/p pacemaker placement 12/2017, St Jude BiV pacemaker for complete AV block - followed by EP, no recent symptoms.    5. Afib - new diagnosis during 12/2017 admission  - s/p DCCV 02/04/18. Returned back to afib - no recent symptoms, compliant with meds   SH: daughter getting married in March 28th, cruise after as family.   Past Medical History:  Diagnosis Date  . AF (atrial fibrillation) (HCC) 12/29/2017  . CAD in native artery    08/05/16 Cath, atresia of LIMA to LAD, occlusion of SVG-->RCA, SVG-->Circumflex/OM2, SVG-->OM1, patent SVG to diag, DES to native circumflex.   . Erectile dysfunction   . Hyperlipidemia, mixed   . Myocardial  infarction [facility]) 2001   Out of hospital myocardial infarction/inferior wall, status post Cardiolite study 2007.  Ejection fraction 45%  . Pacemaker 12/2017  . Unspecified essential hypertension      Allergies  Allergen Reactions  . Acetaminophen Other (See Comments)    Hallucinations     Current Outpatient Medications  Medication Sig Dispense Refill  . atorvastatin (LIPITOR) 80 MG tablet TAKE 1 TABLET (80 MG TOTAL) BY MOUTH DAILY. 90 tablet 1  . clonazePAM (KLONOPIN) 0.5 MG tablet Take 0.5 mg by mouth at bedtime as needed for anxiety.    . furosemide (LASIX) 20 MG tablet Take 1 tablet (20 mg total) by mouth daily. 90 tablet 3  . metoprolol succinate (TOPROL-XL) 100 MG 24 hr tablet TAKE 1 TABLET (100 MG TOTAL) BY MOUTH DAILY. TAKE WITH OR IMMEDIATELY FOLLOWING A MEAL. 90 tablet 3  . Multiple Vitamin (MULTIVITAMIN) tablet Take 1 tablet by mouth daily.     . nitroGLYCERIN (NITROSTAT) 0.4 MG SL tablet Place 0.4 mg under the tongue every 5 (five) minutes as needed for chest pain.     . pantoprazole (PROTONIX) 40 MG tablet TAKE 1 TABLET BY MOUTH EVERY DAY (Patient taking differently: Take 40 mg by mouth daily. ) 30 tablet 6  . potassium chloride (K-DUR) 10 MEQ tablet Take 1 tablet (10 mEq total) by mouth daily. 90 tablet 3  . rivaroxaban (XARELTO) 20 MG TABS tablet Take 1 tablet (20 mg total) by mouth daily. 30 tablet 11  . sacubitril-valsartan (ENTRESTO)  24-26 MG Take 1 tablet by mouth 2 (two) times daily. 60 tablet 6  . sildenafil (REVATIO) 20 MG tablet Take 20-100 mg by mouth as needed (30 minutes prior to sexual activity).   6   No current facility-administered medications for this visit.      Past Surgical History:  Procedure Laterality Date  . BIV PACEMAKER INSERTION CRT-P N/A 12/25/2017   Procedure: BIV PACEMAKER INSERTION CRT-P;  Surgeon: Marinus Mawaylor, Gregg W, MD;  [facility];  Service: Cardiovascular;  Laterality: N/A;  . CARDIOVERSION N/A 02/04/2018   Procedure:  CARDIOVERSION;  Surgeon: Antoine PocheBranch, Jonathan F, MD;  Location: [facility] ENDO SUITE;  Service: Endoscopy;  Laterality: N/A;  . CATARACT EXTRACTION    . CORONARY ARTERY BYPASS GRAFT  2001   2001.  LIMA to LAD, SVG to OM1, SVG to OM 2, SVG to diagonal, SVG to PDA.  Marland Kitchen. CORONARY BALLOON ANGIOPLASTY N/A 08/05/2016   Procedure: Coronary Balloon Angioplasty;  Surgeon: Corky CraftsJayadeep S Varanasi, MD;  [facility];  Service: Cardiovascular;  Laterality: N/A;  CFX  . CORONARY STENT INTERVENTION N/A 08/05/2016   Procedure: Coronary Stent Intervention;  Surgeon: Corky CraftsJayadeep S Varanasi, MD;  [facility];  Service: Cardiovascular;  Laterality: N/A;  OM  . LAPAROSCOPIC CHOLECYSTECTOMY  06/22/2013   Dr. Cristy FolksBeacham  . LEFT HEART CATH AND CORS/GRAFTS ANGIOGRAPHY N/A 08/05/2016   Procedure: Left Heart Cath and Cors/Grafts Angiography;  Surgeon: Corky CraftsJayadeep S Varanasi, MD;  [facility];  Service: Cardiovascular;  Laterality: N/A;     Allergies  Allergen Reactions  . Acetaminophen Other (See Comments)    Hallucinations      Family History  Problem Relation Age of Onset  . Alzheimer's disease Mother   . Brain cancer Mother        brain tumor  . Stroke Father   . Alzheimer's disease Father      Social History Michael Melton reports that he has never smoked. He has never used smokeless tobacco. Michael Melton reports current alcohol use.   Review of Systems CONSTITUTIONAL: No weight loss, fever, chills, weakness or fatigue.  HEENT: Eyes: No visual loss, blurred vision, double vision or yellow sclerae.No hearing loss, sneezing, congestion, runny nose or sore throat.  SKIN: No rash or itching.  CARDIOVASCULAR: per hpi RESPIRATORY: No shortness of breath, cough or sputum.  GASTROINTESTINAL: No anorexia, nausea, vomiting or diarrhea. No abdominal pain or blood.  GENITOURINARY: No burning on urination, no polyuria NEUROLOGICAL: No headache, dizziness, syncope, paralysis, ataxia, numbness  or tingling in the extremities. No change in bowel or bladder control.  MUSCULOSKELETAL: No muscle, back pain, joint pain or stiffness.  LYMPHATICS: No enlarged nodes. No history of splenectomy.  PSYCHIATRIC: No history of depression or anxiety.  ENDOCRINOLOGIC: No reports of sweating, cold or heat intolerance. No polyuria or polydipsia.  Marland Kitchen.   Physical Examination Vitals:   05/19/18 0933  BP: 127/68  Pulse: 69  SpO2: 99%   Vitals:   05/19/18 0933  Weight: 216 lb 12.8 oz (98.3 kg)  Height: 6\' 2"  (1.88 m)    Gen: resting comfortably, no acute distress HEENT: no scleral icterus, pupils equal round and reactive, no palptable cervical adenopathy,  CV: irreg, no m/rg, no jvd Resp: Clear to auscultation bilaterally GI: abdomen is soft, non-tender, non-distended, normal bowel sounds, no hepatosplenomegaly MSK: extremities are warm, no edema.  Skin: warm, no rash Neuro:  no focal deficits Psych: appropriate affect   Diagnostic Studies 01/2013  Echo Study Conclusions  - Left ventricle: Systolic function is low normal, with an estimated EF of 50-55%, but closer to 50%. There is mild global hypokinesis. The cavity size was normal. Wall thickness was increased in a pattern of mild LVH. There was an increased relative contribution of atrial contraction to ventricular filling. Doppler parameters are consistent with abnormal left ventricular relaxation (grade 1 diastolic dysfunction). - Ventricular septum: Motion consistent with post-operative changes. The contour showed diastolic flattening. - Left atrium: The atrium was mildly dilated.   09/2012 ETT Exc 6 min 50 sec, 10.1 METs. No specific ischemic changes, frequent PVCs and PACs, 3 beat run of NSVT.   06/2016 Nuclear stress test  ECG nondiagnostic due to IVCD. No arrhythmias noted during exercise.  Blood pressure demonstrated a hypertensive response to exercise.  Medium, moderate intensity, inferior/inferolateral defect  extended from apex to base. There is a partially reversible zone in the inferolateral wall at the base. Most suggestive of scar with mild peri-infarct ischemia.  This is an intermediate risk study.  Nuclear stress EF: 40%. Consider confirmation with echocardiography.   07/2016 echo Study Conclusions  - Left ventricle: The cavity size was at the upper limits of normal. Wall thickness was increased in a pattern of mild LVH. The estimated ejection fraction was 40%. Diffuse hypokinesis. There is akinesis of the basalinferior myocardium. Doppler parameters are consistent with abnormal left ventricular relaxation (grade 1 diastolic dysfunction). - Aortic valve: Mildly calcified annulus. Trileaflet. - Mitral valve: Mildly thickened leaflets . There was mild regurgitation. - Left atrium: The atrium was mildly dilated. - Tricuspid valve: There was trivial regurgitation. - Pulmonary arteries: Systolic pressure could not be accurately estimated. - Pericardium, extracardiac: There was no pericardial effusion.  Impressions:  - Mild LVH with upper normal chamber size and LVEF approximately 40%. There is diffuse hypokinesis with inferior basal akinesis. Probable grade 1 diastolic dysfunction. Mild left atrial enlargement. Mildly thickened mitral leaflets with mild mitral regurgitation.   08/2016 cath  Severe native three-vessel coronary artery disease.  Mid RCA lesion, 100 %stenosed. SVG to PDA is occluded. There are right to right and left to right collaterals.  Mid LAD lesion, 100 %stenosed. LIMA to LAD is atretic. LAD fills from flow from the SVG to diagonal filling the LAD.  Ost 2nd Mrg to 2nd Mrg lesion, 99 %stenosed. SVG to OM2 is occluded.  Ost 1st Mrg to 1st Mrg lesion, 80 %stenosed. Free RIMA graft to OM 1 is occluded. A STENT PROMUS PREM MR 3.5X28 drug eluting stent was successfully placed., postdilated to > 64mm.  Post intervention, there is a 0%  residual stenosis.  Mid Cx lesion, 80 %stenosed. THis was treated with initial cutting balloon angioplasty with a 2.5 mm balloon prior to stent placement in the OM. Then after stent placement, PTCA was done with a 2.0 balloon.  Post intervention, there is a 10% residual stenosis.  The left ventricular ejection fraction is 25-35% by visual estimate.  LV end diastolic pressure is moderately elevated.  There is moderate to severe left ventricular systolic dysfunction.  There is no aortic valve stenosis.  Severe coronary artery disease. Severe graft disease. Only patent graft is the SVG to diagonal which feeds both the diagonal and the LAD. Successful stenting of a large OM1 and balloon angioplasty of the mid circumflex. The RCA territory fills by right to right and left to right collaterals. Continue aggressive secondary prevention. He'll need dual antiplatelet therapy for at least a year. Consider clopidogrel longer-term given  the extent of his disease.    Assessment and Plan    1. CAD/Chronic systolic HF/ICM -recent further decrease in his LVEF though no new symptoms - he was initially reluctant for repeat cath. In absence of new symptoms, reasonable to aggresively titrate meds and repeat echo in next month or 2, if not improved rediscuss cath - increase entresto to 49/51mg  bid, check BMET in 2 weeks. Nursing visit 2 weeks for vitals check  2. Hyperlipidemia -he will continue statin  3. HTN -at goal, continue current meds  4. Complete heart block - continue to follow in device clinic.   5. Afib - s/p DCCV, EKG shows he is back in afib. He is rate controlled and asymptomatic, no strong indication to continue to pursue a rhythm strategy. - continue current meds  F/u pending. Will make further adjustments in meds with nursing visits in the near future      Antoine Poche, M.D.

## 2018-05-20 ENCOUNTER — Encounter: Payer: Self-pay | Admitting: Internal Medicine

## 2018-05-23 ENCOUNTER — Other Ambulatory Visit: Payer: Self-pay | Admitting: Cardiology

## 2018-06-01 DIAGNOSIS — I5022 Chronic systolic (congestive) heart failure: Secondary | ICD-10-CM | POA: Diagnosis not present

## 2018-06-02 ENCOUNTER — Ambulatory Visit (INDEPENDENT_AMBULATORY_CARE_PROVIDER_SITE_OTHER): Payer: Medicare PPO | Admitting: *Deleted

## 2018-06-02 ENCOUNTER — Encounter: Payer: Self-pay | Admitting: *Deleted

## 2018-06-02 DIAGNOSIS — I5022 Chronic systolic (congestive) heart failure: Secondary | ICD-10-CM | POA: Diagnosis not present

## 2018-06-02 NOTE — Progress Notes (Signed)
Pt here for vitals check per 1/14 OV - pt had labs done at [facility] yesterday (will request) denies any complaints today - BP today is 138/88 HR 77

## 2018-06-05 NOTE — Addendum Note (Signed)
Addended by: Burman Nieves T on: 06/05/2018 04:26 PM   Modules accepted: Orders

## 2018-06-05 NOTE — Progress Notes (Signed)
Vitals look good, can he increase Toprol to 150mg  daily Nursing visit 2 weeks for vitals check. Can we make sure we get his labs   J BrancH MD

## 2018-06-05 NOTE — Progress Notes (Signed)
Pt voiced understanding - updated medication list - nurse appt scheduled

## 2018-06-08 ENCOUNTER — Telehealth: Payer: Self-pay | Admitting: *Deleted

## 2018-06-08 NOTE — Telephone Encounter (Signed)
Pt aware - routed to pcp  

## 2018-06-08 NOTE — Telephone Encounter (Signed)
-----   Message from Antoine Poche, MD sent at 06/05/2018  4:23 PM EST ----- Labs look good  Dominga Ferry MD

## 2018-06-11 ENCOUNTER — Encounter: Payer: Self-pay | Admitting: Internal Medicine

## 2018-06-11 ENCOUNTER — Ambulatory Visit: Payer: Medicare PPO | Admitting: Internal Medicine

## 2018-06-11 VITALS — BP 160/100 | HR 71 | Ht 74.0 in | Wt 222.8 lb

## 2018-06-11 DIAGNOSIS — R001 Bradycardia, unspecified: Secondary | ICD-10-CM | POA: Diagnosis not present

## 2018-06-11 DIAGNOSIS — I5022 Chronic systolic (congestive) heart failure: Secondary | ICD-10-CM

## 2018-06-11 DIAGNOSIS — I4891 Unspecified atrial fibrillation: Secondary | ICD-10-CM | POA: Diagnosis not present

## 2018-06-11 LAB — CUP PACEART INCLINIC DEVICE CHECK
Battery Remaining Longevity: 96 mo
Battery Voltage: 2.96 V
Date Time Interrogation Session: 20200206141103
Implantable Lead Implant Date: 20190822
Implantable Lead Implant Date: 20190822
Implantable Lead Location: 753858
Implantable Lead Location: 753859
Implantable Lead Location: 753860
Implantable Pulse Generator Implant Date: 20190822
Lead Channel Impedance Value: 587.5 Ohm
Lead Channel Impedance Value: 712.5 Ohm
Lead Channel Impedance Value: 900 Ohm
Lead Channel Pacing Threshold Amplitude: 0.5 V
Lead Channel Pacing Threshold Amplitude: 1.5 V
Lead Channel Pacing Threshold Pulse Width: 0.5 ms
Lead Channel Pacing Threshold Pulse Width: 0.5 ms
Lead Channel Pacing Threshold Pulse Width: 0.5 ms
Lead Channel Sensing Intrinsic Amplitude: 5 mV
Lead Channel Setting Pacing Amplitude: 2 V
Lead Channel Setting Pacing Amplitude: 2.5 V
Lead Channel Setting Pacing Amplitude: 3.5 V
Lead Channel Setting Pacing Pulse Width: 0.5 ms
Lead Channel Setting Pacing Pulse Width: 0.5 ms
Lead Channel Setting Sensing Sensitivity: 2 mV
MDC IDC LEAD IMPLANT DT: 20190822
MDC IDC MSMT LEADCHNL LV PACING THRESHOLD AMPLITUDE: 1.5 V
MDC IDC MSMT LEADCHNL RV PACING THRESHOLD AMPLITUDE: 0.5 V
MDC IDC MSMT LEADCHNL RV PACING THRESHOLD PULSEWIDTH: 0.5 ms
MDC IDC STAT BRADY RA PERCENT PACED: 1.5 %
MDC IDC STAT BRADY RV PERCENT PACED: 91 %
Pulse Gen Serial Number: 9056721

## 2018-06-11 NOTE — Patient Instructions (Signed)
Medication Instructions:  Your physician recommends that you continue on your current medications as directed. Please refer to the Current Medication list given to you today.  If you need a refill on your cardiac medications before your next appointment, please call your pharmacy.   Lab work: None   If you have labs (blood work) drawn today and your tests are completely normal, you will receive your results only by: Marland Kitchen MyChart Message (if you have MyChart) OR . A paper copy in the mail If you have any lab test that is abnormal or we need to change your treatment, we will call you to review the results.  Testing/Procedures: None   Follow-Up: At [facility], you and your health needs are our priority.  As part of our continuing mission to provide you with exceptional heart care, we have created designated Provider Care Teams.  These Care Teams include your primary Cardiologist (physician) and Advanced Practice Providers (APPs -  Physician Assistants and Nurse Practitioners) who all work together to provide you with the care you need, when you need it. You will need a follow up appointment in 7 months.  Please call our office 2 months in advance to schedule this appointment.  You may see Lewayne Bunting, MD or one of the following Advanced Practice Providers on your designated Care Team:   Gypsy Balsam, NP . Francis Dowse, PA-C  Any Other Special Instructions Will Be Listed Below (If Applicable). Thank you for choosing [facility]!

## 2018-06-11 NOTE — Progress Notes (Signed)
HPI Michael Melton returns today for followup of his biv ppm for CHF. He also has persistent atrial fib. He has undergone DCCV but not maintain NSR. He feels well and denies chest pain or peripheral edema. No sob.  Allergies  Allergen Reactions  . Acetaminophen Other (See Comments)    Hallucinations     Current Outpatient Medications  Medication Sig Dispense Refill  . atorvastatin (LIPITOR) 80 MG tablet TAKE 1 TABLET BY MOUTH EVERY DAY 90 tablet 3  . clonazePAM (KLONOPIN) 0.5 MG tablet Take 0.5 mg by mouth at bedtime as needed for anxiety.    . furosemide (LASIX) 20 MG tablet Take 1 tablet (20 mg total) by mouth daily. 90 tablet 3  . metoprolol succinate (TOPROL-XL) 100 MG 24 hr tablet Take 150 mg by mouth daily. Take with or immediately following a meal.    . Multiple Vitamin (MULTIVITAMIN) tablet Take 1 tablet by mouth daily.     . nitroGLYCERIN (NITROSTAT) 0.4 MG SL tablet Place 0.4 mg under the tongue every 5 (five) minutes as needed for chest pain.     . pantoprazole (PROTONIX) 40 MG tablet TAKE 1 TABLET BY MOUTH EVERY DAY (Patient taking differently: Take 40 mg by mouth daily. ) 30 tablet 6  . potassium chloride (K-DUR) 10 MEQ tablet Take 1 tablet (10 mEq total) by mouth daily. 90 tablet 3  . rivaroxaban (XARELTO) 20 MG TABS tablet Take 1 tablet (20 mg total) by mouth daily. 30 tablet 11  . sacubitril-valsartan (ENTRESTO) 49-51 MG Take 1 tablet by mouth 2 (two) times daily. 28 tablet 0  . sildenafil (REVATIO) 20 MG tablet Take 20-100 mg by mouth as needed (30 minutes prior to sexual activity).   6   No current facility-administered medications for this visit.      Past Medical History:  Diagnosis Date  . AF (atrial fibrillation) (HCC) 12/29/2017  . CAD in native artery    08/05/16 Cath, atresia of LIMA to LAD, occlusion of SVG-->RCA, SVG-->Circumflex/OM2, SVG-->OM1, patent SVG to diag, DES to native circumflex.   . Erectile dysfunction   . Hyperlipidemia, mixed   .  Myocardial infarction [facility](HCC) 2001   Out of hospital myocardial infarction/inferior wall, status post Cardiolite study 2007.  Ejection fraction 45%  . Pacemaker 12/2017  . Unspecified essential hypertension     ROS:   All systems reviewed and negative except as noted in the HPI.   Past Surgical History:  Procedure Laterality Date  . BIV PACEMAKER INSERTION CRT-P N/A 12/25/2017   Procedure: BIV PACEMAKER INSERTION CRT-P;  Surgeon: Marinus Mawaylor,  W, MD;  [facility];  Service: Cardiovascular;  Laterality: N/A;  . CARDIOVERSION N/A 02/04/2018   Procedure: CARDIOVERSION;  Surgeon: Antoine PocheBranch, Jonathan F, MD;  Location[facility];  Service: Endoscopy;  Laterality: N/A;  . CATARACT EXTRACTION    . CORONARY ARTERY BYPASS GRAFT  2001   2001.  LIMA to LAD, SVG to OM1, SVG to OM 2, SVG to diagonal, SVG to PDA.  Marland Kitchen. CORONARY BALLOON ANGIOPLASTY N/A 08/05/2016   Procedure: Coronary Balloon Angioplasty;  Surgeon: Corky CraftsJayadeep S Varanasi, MD;  [facility];  Service: Cardiovascular;  Laterality: N/A;  CFX  . CORONARY STENT INTERVENTION N/A 08/05/2016   Procedure: Coronary Stent Intervention;  Surgeon: Corky CraftsJayadeep S Varanasi, MD;  [facility];  Service: Cardiovascular;  Laterality: N/A;  OM  . LAPAROSCOPIC CHOLECYSTECTOMY  06/22/2013   Dr. Cristy FolksBeacham  . LEFT HEART  CATH AND CORS/GRAFTS ANGIOGRAPHY N/A 08/05/2016   Procedure: Left Heart Cath and Cors/Grafts Angiography;  Surgeon: Corky CraftsJayadeep S Varanasi, MD;  [facility];  Service: Cardiovascular;  Laterality: N/A;     Family History  Problem Relation Age of Onset  . Alzheimer's disease Mother   . Brain cancer Mother        brain tumor  . Stroke Father   . Alzheimer's disease Father      Social History   Socioeconomic History  . Marital status: Married    Spouse name: Not on file  . Number of children: Not on file  . Years of education: Not on file  . Highest education level: Not on file    Occupational History  . Occupation: Full time    Employer: [employer]  Social Needs  . Financial resource strain: Not on file  . Food insecurity:    Worry: Not on file    Inability: Not on file  . Transportation needs:    Medical: Not on file    Non-medical: Not on file  Tobacco Use  . Smoking status: Never Smoker  . Smokeless tobacco: Never Used  Substance and Sexual Activity  . Alcohol use: Yes    Alcohol/week: 0.0 standard drinks    Comment: RARE  . Drug use: No  . Sexual activity: Not on file  Lifestyle  . Physical activity:    Days per week: Not on file    Minutes per session: Not on file  . Stress: Not on file  Relationships  . Social connections:    Talks on phone: Not on file    Gets together: Not on file    Attends religious service: Not on file    Active member of club or organization: Not on file    Attends meetings of clubs or organizations: Not on file    Relationship status: Not on file  . Intimate partner violence:    Fear of current or ex partner: Not on file    Emotionally abused: Not on file    Physically abused: Not on file    Forced sexual activity: Not on file  Other Topics Concern  . Not on file  Social History Narrative  . Not on file     BP (!) 160/100   Pulse 71   Ht 6\' 2"  (1.88 m)   Wt 222 lb 12.8 oz (101.1 kg)   SpO2 96% Comment: on room air  BMI 28.61 kg/m   Physical Exam:  Well appearing 68 yo man, NAD HEENT: Unremarkable Neck:  6 cm JVD, no thyromegally Lymphatics:  No adenopathy Back:  No CVA tenderness Lungs:  Clear with no wheezes HEART:  Regular rate rhythm, no murmurs, no rubs, no clicks Abd:  soft, positive bowel sounds, no organomegally, no rebound, no guarding Ext:  2 plus pulses, no edema, no cyanosis, no clubbing Skin:  No rashes no nodules Neuro:  CN II through XII intact, motor grossly intact   DEVICE  Normal device function.  See PaceArt for details.   Assess/Plan: 1. Chronic systolic heart failure -  his symptoms are class 2. He is being uptitrated on his Entresto under the direction of Dr. Wyline MoodBranch. 2. CAD - he denies anginal symptoms. He will continue his current meds. 3. Atrial fib - his rate is well controlled. He could not tell that he was back in atrial fib. I would be inclined not to give him an AA drug. He will continue  Xarelto. 4. PPM - his St[employer] Jude biv PPM is working normally. We will recheck in several months. We have turned down his outputs.  Leonia Reeves.D.

## 2018-06-16 ENCOUNTER — Ambulatory Visit (INDEPENDENT_AMBULATORY_CARE_PROVIDER_SITE_OTHER): Payer: Medicare PPO | Admitting: *Deleted

## 2018-06-16 DIAGNOSIS — I5022 Chronic systolic (congestive) heart failure: Secondary | ICD-10-CM | POA: Diagnosis not present

## 2018-06-16 NOTE — Progress Notes (Signed)
Pt here for repeat nurse visit per recent med change Toprol XL 150 mg daily - BP today 138/88 HR 71 - pt also wants to know if he should continue on Entresto 49/51 mg or increase - has 3 tablets left of the 49/51 mg

## 2018-06-17 MED ORDER — SACUBITRIL-VALSARTAN 97-103 MG PO TABS: 1.0000 | ORAL_TABLET | Freq: Two times a day (BID) | ORAL | 3 refills | Status: DC

## 2018-06-17 NOTE — Progress Notes (Signed)
Please increase entresto to 97/103mg  bid, needs BMET in 2 weeks with nursing visit for vitals check  Dominga Ferry MD

## 2018-06-17 NOTE — Progress Notes (Signed)
Pt aware and voice understanding - will come by [place] office to pick up labs - Entresto sent to [facility] [place] - pt given co pay card and assistance info at last nurse visit

## 2018-06-17 NOTE — Addendum Note (Signed)
Addended by: Burman Nieves T on: 06/17/2018 11:58 AM   Modules accepted: Orders

## 2018-06-24 ENCOUNTER — Other Ambulatory Visit: Payer: Self-pay | Admitting: *Deleted

## 2018-06-24 MED ORDER — METOPROLOL SUCCINATE ER 100 MG PO TB24: ORAL_TABLET | ORAL | 1 refills | Status: DC

## 2018-06-25 DIAGNOSIS — I5022 Chronic systolic (congestive) heart failure: Secondary | ICD-10-CM | POA: Diagnosis not present

## 2018-07-02 ENCOUNTER — Ambulatory Visit (INDEPENDENT_AMBULATORY_CARE_PROVIDER_SITE_OTHER): Payer: Medicare PPO | Admitting: *Deleted

## 2018-07-02 VITALS — BP 142/78 | HR 70

## 2018-07-02 DIAGNOSIS — I5042 Chronic combined systolic (congestive) and diastolic (congestive) heart failure: Secondary | ICD-10-CM

## 2018-07-02 DIAGNOSIS — I10 Essential (primary) hypertension: Secondary | ICD-10-CM | POA: Diagnosis not present

## 2018-07-02 NOTE — Progress Notes (Signed)
Patient in office this morning for vitals recheck.  Recent increase of Entresto to 97/103mg .   BP this morning 142/78, but patient states that is about par for him.  Also, had been out this morning chasing things in his yard from last nights wind.  Otherwise, stated he has been tolerating medication well.

## 2018-07-02 NOTE — Progress Notes (Signed)
Increase toprol to 200mg  daily, f/u with me 3-4 weeks   Dominga Ferry MD

## 2018-07-06 MED ORDER — METOPROLOL SUCCINATE ER 200 MG PO TB24: 200.0000 mg | ORAL_TABLET | Freq: Every day | ORAL | 6 refills | Status: DC

## 2018-07-06 NOTE — Progress Notes (Signed)
Patient notified and verbalized understanding.  New medication sent to [facility] OV scheduled for Wednesday, 09/02/18 at 1:20 with Dr. Wyline Mood in [place] office.

## 2018-07-07 ENCOUNTER — Telehealth: Payer: Self-pay | Admitting: *Deleted

## 2018-07-07 NOTE — Telephone Encounter (Signed)
Patient informed. Copy sent to PCP °

## 2018-07-07 NOTE — Telephone Encounter (Signed)
-----   Message from Antoine Poche, MD sent at 06/30/2018 12:47 PM EST ----- Labs look good  Dominga Ferry MD

## 2018-07-10 ENCOUNTER — Telehealth: Payer: Self-pay | Admitting: Cardiology

## 2018-07-10 NOTE — Telephone Encounter (Signed)
Patient walk in  Brought form by for Dr Wyline Mood to fill out. The form is for patient to be able to cancel his cruise and get a refund.  He needs a letter stating that due to his heart condition he can not be exposed to potential threat of Corona Virus and being quarantined.  They asked that nurse call them about how letter should read.    Form placed in nurse pick up box

## 2018-07-10 NOTE — Telephone Encounter (Signed)
Mrs. Babar called asking to speak with someone about the form that she dropped off this morning

## 2018-07-13 ENCOUNTER — Encounter: Payer: Self-pay | Admitting: *Deleted

## 2018-07-13 NOTE — Telephone Encounter (Signed)
LM - form, letter, and LOV printed and available to be picked up

## 2018-07-21 ENCOUNTER — Encounter: Payer: Medicare PPO | Admitting: Internal Medicine

## 2018-08-31 ENCOUNTER — Other Ambulatory Visit: Payer: Self-pay

## 2018-08-31 MED ORDER — PANTOPRAZOLE SODIUM 40 MG PO TBEC: 40.0000 mg | DELAYED_RELEASE_TABLET | Freq: Every day | ORAL | 1 refills | Status: DC

## 2018-08-31 NOTE — Telephone Encounter (Signed)
Refilled protonix per fax request 

## 2018-09-02 ENCOUNTER — Encounter: Payer: Self-pay | Admitting: Cardiology

## 2018-09-02 ENCOUNTER — Telehealth (INDEPENDENT_AMBULATORY_CARE_PROVIDER_SITE_OTHER): Payer: Medicare PPO | Admitting: Cardiology

## 2018-09-02 VITALS — Ht 74.0 in | Wt 210.0 lb

## 2018-09-02 DIAGNOSIS — I2510 Atherosclerotic heart disease of native coronary artery without angina pectoris: Secondary | ICD-10-CM

## 2018-09-02 DIAGNOSIS — I5022 Chronic systolic (congestive) heart failure: Secondary | ICD-10-CM

## 2018-09-02 DIAGNOSIS — I4891 Unspecified atrial fibrillation: Secondary | ICD-10-CM

## 2018-09-02 NOTE — Progress Notes (Signed)
Virtual Visit via Telephone Note   This visit type was conducted due to national recommendations for restrictions regarding the COVID-19 Pandemic (e.g. social distancing) in an effort to limit this patient's exposure and mitigate transmission in our community.  Due to his co-morbid illnesses, this patient is at least at moderate risk for complications without adequate follow up.  This format is felt to be most appropriate for this patient at this time.  The patient did not have access to video technology/had technical difficulties with video requiring transitioning to audio format only (telephone).  All issues noted in this document were discussed and addressed.  No physical exam could be performed with this format.  Please refer to the patient's chart for his  consent to telehealth for [facility]   Evaluation Performed:  Follow-up visit  Date:  09/02/2018   ID:  Michael Melton, DOB 05/01/1951, MRN [id number]  Patient Location: Home Provider Location: Office  PCP:  Michael Pandy, MD  Cardiologist:  Dina Rich, MD  Electrophysiologist:  Lewayne Bunting, MD   Chief Complaint:  3 month follow up  History of Present Illness:    Michael Melton is a 68 y.o. male seen today for a focused visit for his history of CAD, afib, and chronic sysotlic HF.    1. CAD/ICM - hx of CABG in 2001, at [facility].  - GXT 09/2012 with no ischemic changes, did have frequent PVCs, 3 beat run of NSVT - echo 01/2013 LVEF 50%, grade I diastoilc dysfunction.  - echo 07/2016 LVEF 40%, grade I diastolic dysfunction - 06/2016 nuclear stress primarily scar with mild peri-infarct ischemia. LVEF 40%, intermediate risk.    - cath 08/2016: mid LAD occluded, mid LCX 80%, RCA 100%. OM 1 80%. OM2 99%. SVG-RPDA occluded, LIMA-LAD atretic and occluded in midportion, LAD fills from SVG-diag. SVG-D2 patent, RIMA-OM1 occluded, SVG-OM2 occluded. RCA territory fills by collaterals Received angiolpasty to LCX, OM1 80%  receivded DES. Recs for indefinite plavix.  03/2018 echo LVEF 25-30%, diffuse hypokinesis, grade II diastoilc dysfunction, mild MR    -we have titrated his toprol and entresto since last visit. Tolerating well without side effects - we discussed repeat cath last visit due to drop in LVEF however he was reluctant at the time. Plan was to optimize medical therapy and recheck LVEF.  - no recent symptoms    2. Symptomatic bradycardia - s/p pacemaker placement 12/2017, [employer] BiV pacemaker for complete AV block. At time of implant LVEF was 40%, did not decrease until 03/2018 - followed by EP  - no symptoms, normal device function 06/2018   3. Afib - new diagnosis during 12/2017 admission - s/p DCCV 02/04/18.Returned back to afib. Plan for rate control strategy - denies palpitations  SH: daughter got married in 07/2018, small ceremony due to COVID-19. Had to cancel the family cruise after     The patient does not have symptoms concerning for COVID-19 infection (fever, chills, cough, or new shortness of breath).    Past Medical History:  Diagnosis Date  . AF (atrial fibrillation) (HCC) 12/29/2017  . CAD in native artery    08/05/16 Cath, atresia of LIMA to LAD, occlusion of SVG-->RCA, SVG-->Circumflex/OM2, SVG-->OM1, patent SVG to diag, DES to native circumflex.   . Erectile dysfunction   . Hyperlipidemia, mixed   . Myocardial infarction [facility]) 2001   Out of hospital myocardial infarction/inferior wall, status post Cardiolite study 2007.  Ejection fraction 45%  . Pacemaker 12/2017  . Unspecified  essential hypertension    Past Surgical History:  Procedure Laterality Date  . BIV PACEMAKER INSERTION CRT-P N/A 12/25/2017   Procedure: BIV PACEMAKER INSERTION CRT-P;  Surgeon: Marinus Mawaylor, Gregg W, MD;  [facility];  Service: Cardiovascular;  Laterality: N/A;  . CARDIOVERSION N/A 02/04/2018   Procedure: CARDIOVERSION;  Surgeon: Antoine PocheBranch, Eveleen Mcnear F, MD;  Location: [facility]  ENDO SUITE;  Service: Endoscopy;  Laterality: N/A;  . CATARACT EXTRACTION    . CORONARY ARTERY BYPASS GRAFT  2001   2001.  LIMA to LAD, SVG to OM1, SVG to OM 2, SVG to diagonal, SVG to PDA.  Marland Kitchen. CORONARY BALLOON ANGIOPLASTY N/A 08/05/2016   Procedure: Coronary Balloon Angioplasty;  Surgeon: Corky CraftsJayadeep S Varanasi, MD;  [facility];  Service: Cardiovascular;  Laterality: N/A;  CFX  . CORONARY STENT INTERVENTION N/A 08/05/2016   Procedure: Coronary Stent Intervention;  Surgeon: Corky CraftsJayadeep S Varanasi, MD;  [facility];  Service: Cardiovascular;  Laterality: N/A;  OM  . LAPAROSCOPIC CHOLECYSTECTOMY  06/22/2013   Dr. Cristy FolksBeacham  . LEFT HEART CATH AND CORS/GRAFTS ANGIOGRAPHY N/A 08/05/2016   Procedure: Left Heart Cath and Cors/Grafts Angiography;  Surgeon: Corky CraftsJayadeep S Varanasi, MD;  [facility];  Service: Cardiovascular;  Laterality: N/A;     No outpatient medications have been marked as taking for the 09/02/18 encounter (Appointment) with Antoine PocheBranch, Phyllis Whitefield F, MD.     Allergies:   Acetaminophen   Social History   Tobacco Use  . Smoking status: Never Smoker  . Smokeless tobacco: Never Used  Substance Use Topics  . Alcohol use: Yes    Alcohol/week: 0.0 standard drinks    Comment: RARE  . Drug use: No     Family Hx: The patient's family history includes Alzheimer's disease in his father and mother; Brain cancer in his mother; Stroke in his father.  ROS:   Please see the history of present illness.     All other systems reviewed and are negative.   Prior CV studies:   The following studies were reviewed today:  01/2013 Echo Study Conclusions  - Left ventricle: Systolic function is low normal, with an estimated EF of 50-55%, but closer to 50%. There is mild global hypokinesis. The cavity size was normal. Wall thickness was increased in a pattern of mild LVH. There was an increased relative contribution of atrial contraction to ventricular  filling. Doppler parameters are consistent with abnormal left ventricular relaxation (grade 1 diastolic dysfunction). - Ventricular septum: Motion consistent with post-operative changes. The contour showed diastolic flattening. - Left atrium: The atrium was mildly dilated.   09/2012 ETT Exc 6 min 50 sec, 10.1 METs. No specific ischemic changes, frequent PVCs and PACs, 3 beat run of NSVT.   06/2016 Nuclear stress test  ECG nondiagnostic due to IVCD. No arrhythmias noted during exercise.  Blood pressure demonstrated a hypertensive response to exercise.  Medium, moderate intensity, inferior/inferolateral defect extended from apex to base. There is a partially reversible zone in the inferolateral wall at the base. Most suggestive of scar with mild peri-infarct ischemia.  This is an intermediate risk study.  Nuclear stress EF: 40%. Consider confirmation with echocardiography.   07/2016 echo Study Conclusions  - Left ventricle: The cavity size was at the upper limits of normal. Wall thickness was increased in a pattern of mild LVH. The estimated ejection fraction was 40%. Diffuse hypokinesis. There is akinesis of the basalinferior myocardium. Doppler parameters are consistent with abnormal left ventricular relaxation (  grade 1 diastolic dysfunction). - Aortic valve: Mildly calcified annulus. Trileaflet. - Mitral valve: Mildly thickened leaflets . There was mild regurgitation. - Left atrium: The atrium was mildly dilated. - Tricuspid valve: There was trivial regurgitation. - Pulmonary arteries: Systolic pressure could not be accurately estimated. - Pericardium, extracardiac: There was no pericardial effusion.  Impressions:  - Mild LVH with upper normal chamber size and LVEF approximately 40%. There is diffuse hypokinesis with inferior basal akinesis. Probable grade 1 diastolic dysfunction. Mild left atrial enlargement. Mildly thickened mitral leaflets  with mild mitral regurgitation.   08/2016 cath  Severe native three-vessel coronary artery disease.  Mid RCA lesion, 100 %stenosed. SVG to PDA is occluded. There are right to right and left to right collaterals.  Mid LAD lesion, 100 %stenosed. LIMA to LAD is atretic. LAD fills from flow from the SVG to diagonal filling the LAD.  Ost 2nd Mrg to 2nd Mrg lesion, 99 %stenosed. SVG to OM2 is occluded.  Ost 1st Mrg to 1st Mrg lesion, 80 %stenosed. Free RIMA graft to OM 1 is occluded. A STENT PROMUS PREM MR [id number] drug eluting stent was successfully placed., postdilated to > 4mm.  Post intervention, there is a 0% residual stenosis.  Mid Cx lesion, 80 %stenosed. THis was treated with initial cutting balloon angioplasty with a 2.5 mm balloon prior to stent placement in the OM. Then after stent placement, PTCA was done with a 2.0 balloon.  Post intervention, there is a 10% residual stenosis.  The left ventricular ejection fraction is 25-35% by visual estimate.  LV end diastolic pressure is moderately elevated.  There is moderate to severe left ventricular systolic dysfunction.  There is no aortic valve stenosis.  Severe coronary artery disease. Severe graft disease. Only patent graft is the SVG to diagonal which feeds both the diagonal and the LAD. Successful stenting of a large OM1 and balloon angioplasty of the mid circumflex. The RCA territory fills by right to right and left to right collaterals. Continue aggressive secondary prevention. He'll need dual antiplatelet therapy for at least a year. Consider clopidogrel longer-term given the extent of his disease.  Labs/Other Tests and Data Reviewed:    EKG:  na  Recent Labs: 02/03/2018: BUN 16; Creatinine, Ser 1.00; Hemoglobin 13.6; Platelets 163; Potassium 3.9; Sodium 142   Recent Lipid Panel No results found for: CHOL, TRIG, HDL, CHOLHDL, LDLCALC, LDLDIRECT  Wt Readings from Last 3 Encounters:  06/11/18 222 lb 12.8 oz (101.1 kg)   05/19/18 216 lb 12.8 oz (98.3 kg)  04/13/18 222 lb 6.4 oz (100.9 kg)     Objective:    Vital Signs:  There were no vitals taken for this visit.   Normal affect. Normal speech pattern and tone. Comfortable, no apparent distress, No audible signs of SOb or DOE.   ASSESSMENT & PLAN:     1. CAD/Chronic systolic HF/ICM -no symptmos, has tolerated uptitration of toprol and entresto. Hold on aldactone as I would like to keep him from getting follow up labs due to COVID-19 risks at this time - had drop in LVEF, he was reluctant for cath. Plan was for trial of medical therapy and then repeat echo. Hold on repeating echo at this time due to COVID-19 risks, reassess at follow up   2. Afib - no symptoms, continue current meds    COVID-19 Education: The signs and symptoms of COVID-19 were discussed with the patient and how to seek care for testing (follow up with PCP or arrange E-visit).  The importance of social distancing was discussed today.  Time:   Today, I have spent 20 minutes with the patient with telehealth technology discussing the above problems.     Medication Adjustments/Labs and Tests Ordered: Current medicines are reviewed at length with the patient today.  Concerns regarding medicines are outlined above.   Tests Ordered: No orders of the defined types were placed in this encounter.   Medication Changes: No orders of the defined types were placed in this encounter.   Disposition:  Follow up 6 weeks  Signed, Dina Rich, MD  09/02/2018 11:14 AM    [facility]

## 2018-09-02 NOTE — Patient Instructions (Signed)
Your physician recommends that you schedule a follow-up appointment in: 6-8 WEEKS WITH DR [facility]  Your physician recommends that you continue on your current medications as directed. Please refer to the Current Medication list given to you today.  Thank you for choosing [facility]!!

## 2018-09-11 IMAGING — DX DG CHEST 2V
2 series · 2 of 2 positions shown · non-contrast
Comparison: Chest radiograph and chest CT December 23, 2017

CLINICAL DATA: Cardiac arrhythmia with pacemaker placement

EXAM:
CHEST - 2 VIEW

[chest pa]
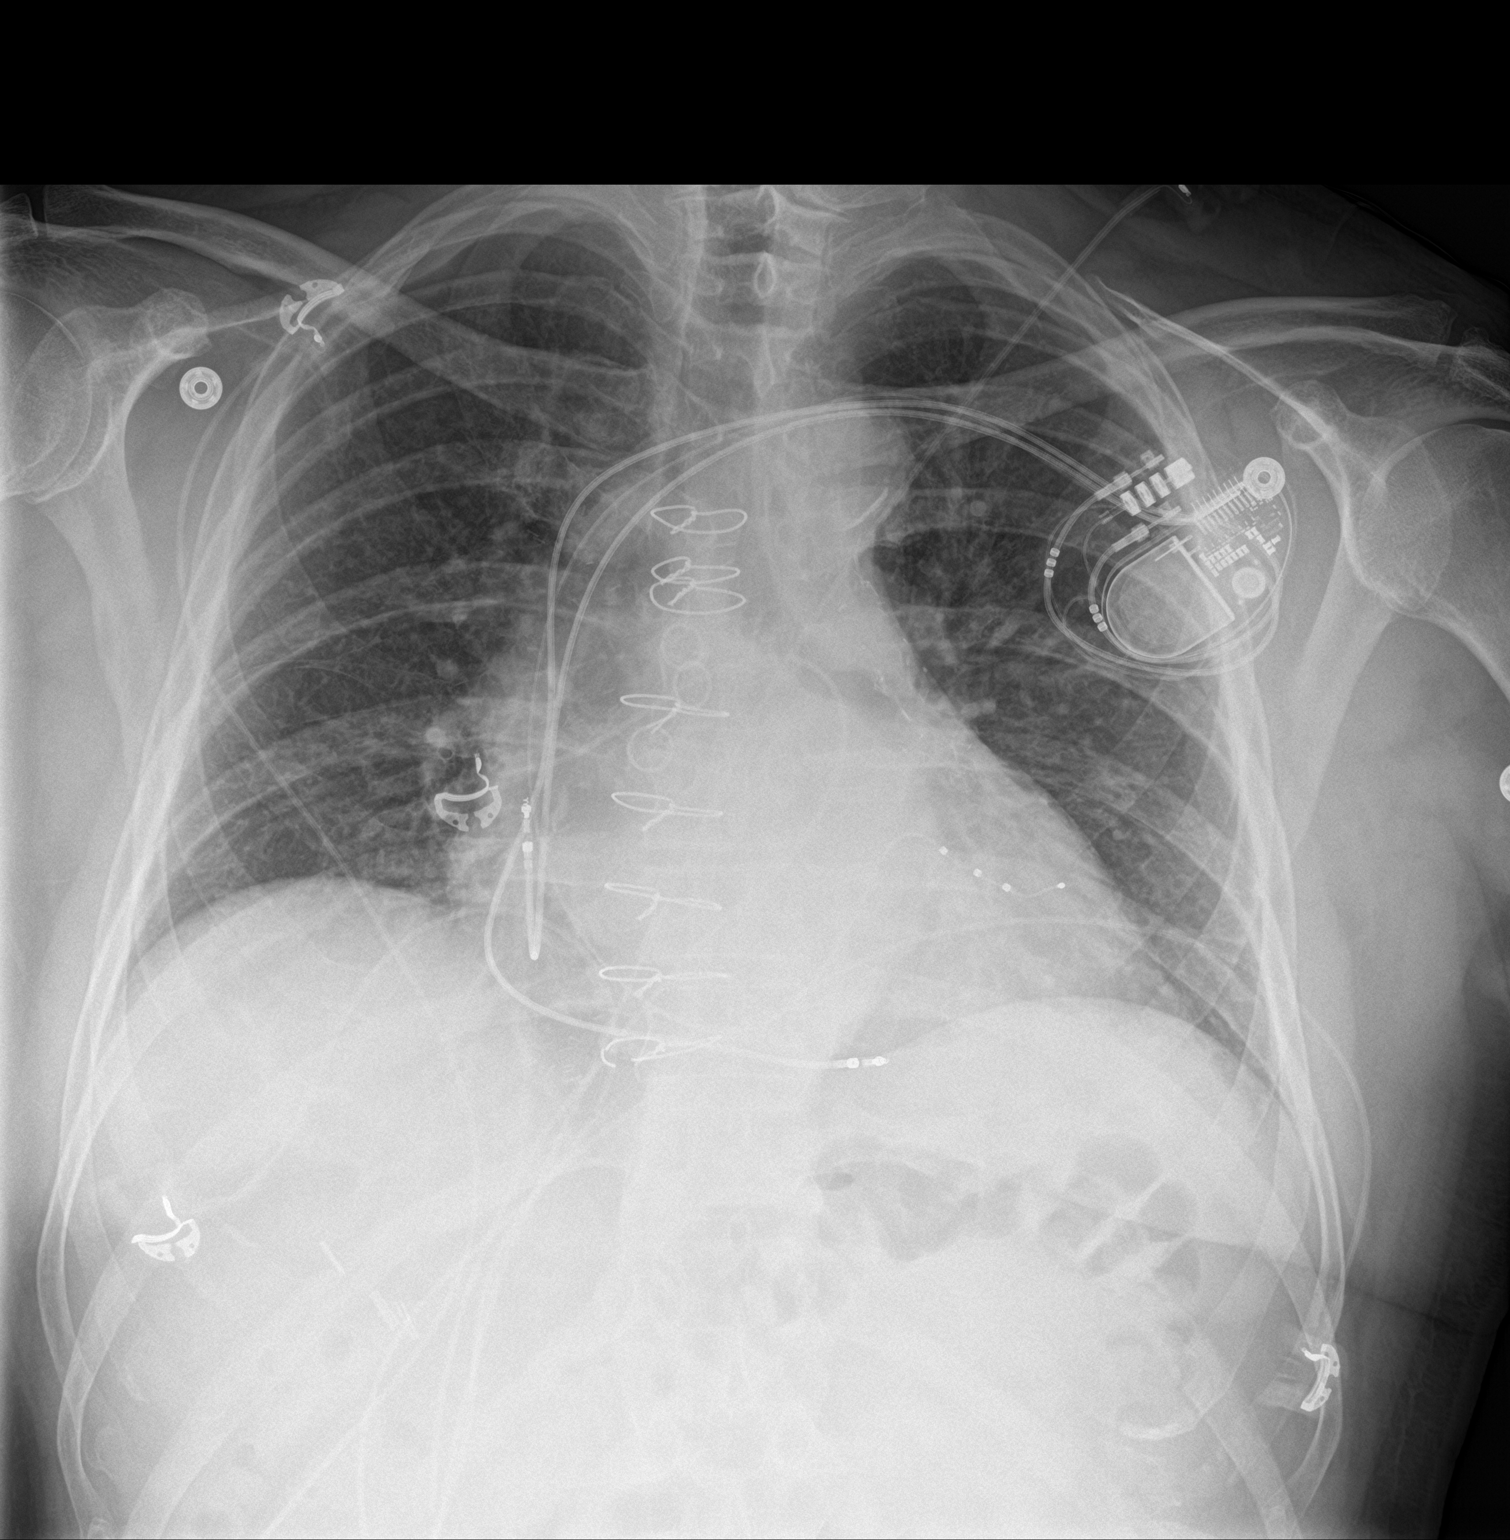

[chest lat]
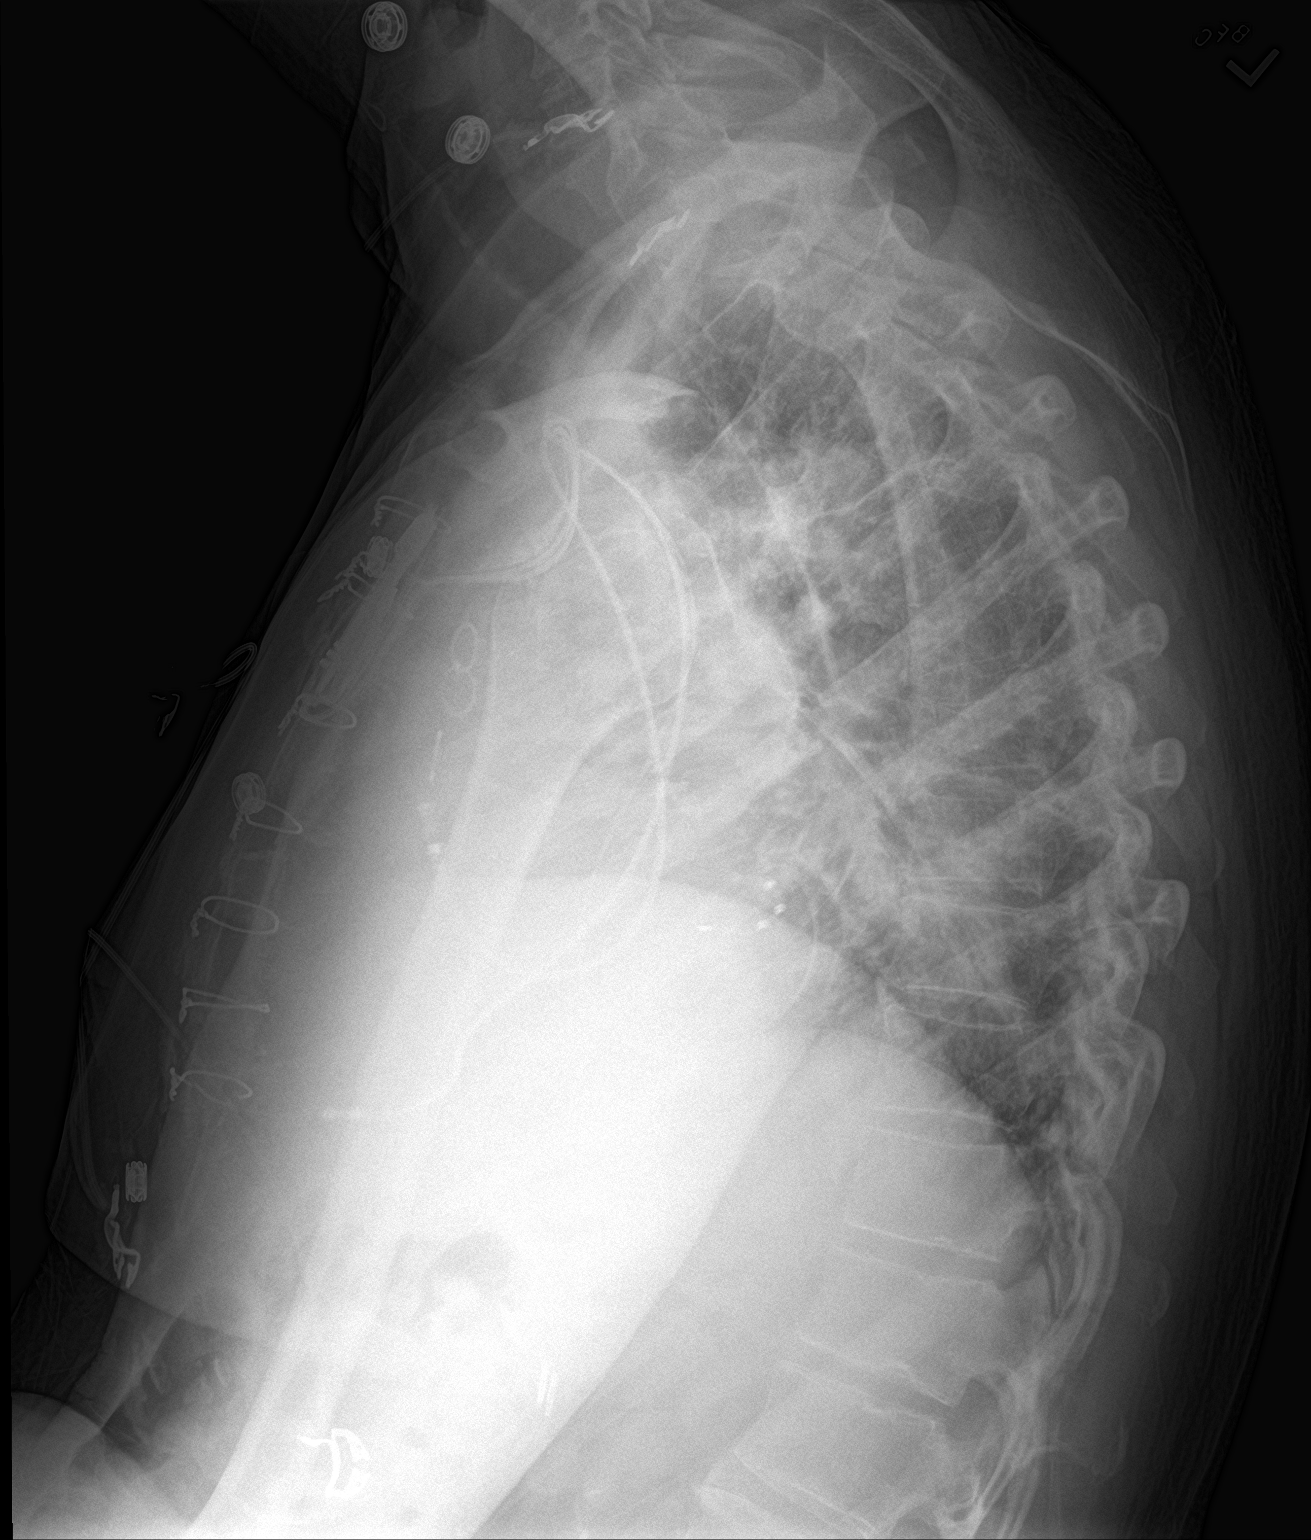

[2 of 2 positions shown; findings below may reference images not displayed]

FINDINGS: There is a pacemaker on the left. Lead tips are attached to the
right atrium, right ventricle, and coronary sinus. No pneumothorax.
There is no edema or consolidation. Heart is mildly enlarged with
pulmonary vascularity normal. No adenopathy. Patient is status post
coronary artery bypass grafting. There is aortic atherosclerosis.
IMPRESSION: Pacemaker leads attached to right atrium, right ventricle, and
coronary sinus. No pneumothorax. Stable cardiac prominence. There is
aortic atherosclerosis. There is no edema or consolidation.

Aortic Atherosclerosis ([id number]).

## 2018-09-14 ENCOUNTER — Other Ambulatory Visit: Payer: Self-pay

## 2018-09-14 ENCOUNTER — Ambulatory Visit (INDEPENDENT_AMBULATORY_CARE_PROVIDER_SITE_OTHER): Payer: Medicare PPO | Admitting: *Deleted

## 2018-09-14 DIAGNOSIS — I4891 Unspecified atrial fibrillation: Secondary | ICD-10-CM

## 2018-09-14 DIAGNOSIS — I5022 Chronic systolic (congestive) heart failure: Secondary | ICD-10-CM

## 2018-09-15 LAB — CUP PACEART REMOTE DEVICE CHECK
Date Time Interrogation Session: 20200512101937
Implantable Lead Implant Date: 20190822
Implantable Lead Implant Date: 20190822
Implantable Lead Implant Date: 20190822
Implantable Lead Location: 753858
Implantable Lead Location: 753859
Implantable Lead Location: 753860
Implantable Pulse Generator Implant Date: 20190822
Pulse Gen Serial Number: 9056721

## 2018-09-21 NOTE — Progress Notes (Signed)
Remote pacemaker transmission.   

## 2018-10-07 ENCOUNTER — Other Ambulatory Visit: Payer: Self-pay | Admitting: Cardiology

## 2018-10-07 DIAGNOSIS — L821 Other seborrheic keratosis: Secondary | ICD-10-CM | POA: Diagnosis not present

## 2018-10-07 DIAGNOSIS — D485 Neoplasm of uncertain behavior of skin: Secondary | ICD-10-CM | POA: Diagnosis not present

## 2018-10-07 DIAGNOSIS — L570 Actinic keratosis: Secondary | ICD-10-CM | POA: Diagnosis not present

## 2018-10-07 DIAGNOSIS — C44319 Basal cell carcinoma of skin of other parts of face: Secondary | ICD-10-CM | POA: Diagnosis not present

## 2018-10-07 DIAGNOSIS — D0439 Carcinoma in situ of skin of other parts of face: Secondary | ICD-10-CM | POA: Diagnosis not present

## 2018-10-15 DIAGNOSIS — C44319 Basal cell carcinoma of skin of other parts of face: Secondary | ICD-10-CM | POA: Diagnosis not present

## 2018-10-15 DIAGNOSIS — C44329 Squamous cell carcinoma of skin of other parts of face: Secondary | ICD-10-CM | POA: Diagnosis not present

## 2018-10-16 ENCOUNTER — Telehealth: Payer: Self-pay | Admitting: Cardiology

## 2018-10-16 ENCOUNTER — Telehealth (INDEPENDENT_AMBULATORY_CARE_PROVIDER_SITE_OTHER): Payer: Medicare PPO | Admitting: Cardiology

## 2018-10-16 ENCOUNTER — Encounter: Payer: Self-pay | Admitting: Cardiology

## 2018-10-16 VITALS — BP 138/80 | HR 65 | Ht 74.0 in | Wt 215.0 lb

## 2018-10-16 DIAGNOSIS — I5022 Chronic systolic (congestive) heart failure: Secondary | ICD-10-CM | POA: Diagnosis not present

## 2018-10-16 DIAGNOSIS — I2510 Atherosclerotic heart disease of native coronary artery without angina pectoris: Secondary | ICD-10-CM | POA: Diagnosis not present

## 2018-10-16 NOTE — Telephone Encounter (Signed)
Pre-cert Verification for the following procedure   Echo scheduled for 10/21/2018 at [facility].

## 2018-10-16 NOTE — Patient Instructions (Signed)
Your physician recommends that you schedule a follow-up appointment in: PENDING TEST RESULTS WITH DR BRANCH  Your physician recommends that you continue on your current medications as directed. Please refer to the Current Medication list given to you today.  Your physician has requested that you have an echocardiogram. Echocardiography is a painless test that uses sound waves to create images of your heart. It provides your doctor with information about the size and shape of your heart and how well your heart's chambers and valves are working. This procedure takes approximately one hour. There are no restrictions for this procedure.   Thank you for choosing [facility]!!    

## 2018-10-16 NOTE — Progress Notes (Signed)
Virtual Visit via Telephone Note   This visit type was conducted due to national recommendations for restrictions regarding the COVID-19 Pandemic (e.g. social distancing) in an effort to limit this patient's exposure and mitigate transmission in our community.  Due to his co-morbid illnesses, this patient is at least at moderate risk for complications without adequate follow up.  This format is felt to be most appropriate for this patient at this time.  The patient did not have access to video technology/had technical difficulties with video requiring transitioning to audio format only (telephone).  All issues noted in this document were discussed and addressed.  No physical exam could be performed with this format.  Please refer to the patient's chart for his  consent to telehealth for [facility].   Date:  10/16/2018   ID:  Michael Melton, DOB [birth year], MRN [id number]  Patient Location: Home Provider Location: Office  PCP:  Estanislado PandySasser, Paul W, MD  Cardiologist:  Dina RichBranch, Breandan People, MD  Electrophysiologist:  Lewayne BuntingGregg Taylor, MD   Evaluation Performed:  Follow-Up Visit  Chief Complaint:  Chronic systolic heart failure follow up  History of Present Illness:    Michael Melton is a 68 y.o. male seen today for a focused visit for his history of chronic systolic HF/ICM.   1. CAD/ICM/Chronic systolic HF - hx of CABG in 2001, [facility].  - GXT 09/2012 with no ischemic changes, did have frequent PVCs, 3 beat run of NSVT - echo 01/2013 LVEF 50%, grade I diastoilc dysfunction.  - echo 07/2016 LVEF 40%, grade I diastolic dysfunction - 06/2016 nuclear stress primarily scar with mild peri-infarct ischemia. LVEF 40%, intermediate risk.    - cath 08/2016: mid LAD occluded, mid LCX 80%, RCA 100%. OM 1 80%. OM2 99%. SVG-RPDA occluded, LIMA-LAD atretic and occluded in midportion, LAD fills from SVG-diag. SVG-D2 patent, RIMA-OM1 occluded, SVG-OM2 occluded. RCA territory fills by collaterals  Received angiolpasty to LCX, OM1 80% receivded DES. Recs for indefinite plavix.  03/2018 echo LVEF 25-30%, diffuse hypokinesis, grade II diastoilc dysfunction, mild MR   - we discussed repeat cath last visit due to drop in LVEF however he was reluctant at the time.Plan was to optimize medical therapy and recheck LVEF.    - no recent chest pain, no SOB/DOE. No edema - compliant with meds    SH: daughter got married in 07/2018, small ceremony due to COVID-19. Had to cancel the family cruise after     The patient does not have symptoms concerning for COVID-19 infection (fever, chills, cough, or new shortness of breath).    Past Medical History:  Diagnosis Date  . AF (atrial fibrillation) (HCC) 12/29/2017  . CAD in native artery    08/05/16 Cath, atresia of LIMA to LAD, occlusion of SVG-->RCA, SVG-->Circumflex/OM2, SVG-->OM1, patent SVG to diag, DES to native circumflex.   . Erectile dysfunction   . Hyperlipidemia, mixed   . Myocardial infarction [facility](HCC) 2001   Out of hospital myocardial infarction/inferior wall, status post Cardiolite study 2007.  Ejection fraction 45%  . Pacemaker 12/2017  . Unspecified essential hypertension    Past Surgical History:  Procedure Laterality Date  . BIV PACEMAKER INSERTION CRT-P N/A 12/25/2017   Procedure: BIV PACEMAKER INSERTION CRT-P;  Surgeon: Marinus Mawaylor, Gregg W, MD;  [facility];  Service: Cardiovascular;  Laterality: N/A;  . CARDIOVERSION N/A 02/04/2018   Procedure: CARDIOVERSION;  Surgeon: Antoine PocheBranch, Sherria Riemann F, MD;  [facility];  Service: Endoscopy;  Laterality: N/A;  .  CATARACT EXTRACTION    . CORONARY ARTERY BYPASS GRAFT  2001   2001.  LIMA to LAD, SVG to OM1, SVG to OM 2, SVG to diagonal, SVG to PDA.  Marland Kitchen CORONARY BALLOON ANGIOPLASTY N/A 08/05/2016   Procedure: Coronary Balloon Angioplasty;  Surgeon: Jettie Booze, MD;  Location: [place] CV LAB;  Service: Cardiovascular;  Laterality: N/A;  CFX  .  CORONARY STENT INTERVENTION N/A 08/05/2016   Procedure: Coronary Stent Intervention;  Surgeon: Jettie Booze, MD;  Location: [place] CV LAB;  Service: Cardiovascular;  Laterality: N/A;  OM  . LAPAROSCOPIC CHOLECYSTECTOMY  06/22/2013   Dr. Truddie Hidden  . LEFT HEART CATH AND CORS/GRAFTS ANGIOGRAPHY N/A 08/05/2016   Procedure: Left Heart Cath and Cors/Grafts Angiography;  Surgeon: Jettie Booze, MD;  Location: [place] CV LAB;  Service: Cardiovascular;  Laterality: N/A;     No outpatient medications have been marked as taking for the 10/16/18 encounter (Appointment) with Arnoldo Lenis, MD.     Allergies:   Acetaminophen   Social History   Tobacco Use  . Smoking status: Never Smoker  . Smokeless tobacco: Never Used  Substance Use Topics  . Alcohol use: Yes    Alcohol/week: 0.0 standard drinks    Comment: RARE  . Drug use: No     Family Hx: The patient's family history includes Alzheimer's disease in his father and mother; Brain cancer in his mother; Stroke in his father.  ROS:   Please see the history of present illness.     All other systems reviewed and are negative.   Prior CV studies:   The following studies were reviewed today:  01/2013 Echo Study Conclusions  - Left ventricle: Systolic function is low normal, with an estimated EF of 50-55%, but closer to 50%. There is mild global hypokinesis. The cavity size was normal. Wall thickness was increased in a pattern of mild LVH. There was an increased relative contribution of atrial contraction to ventricular filling. Doppler parameters are consistent with abnormal left ventricular relaxation (grade 1 diastolic dysfunction). - Ventricular septum: Motion consistent with post-operative changes. The contour showed diastolic flattening. - Left atrium: The atrium was mildly dilated.   09/2012 ETT Exc 6 min 50 sec, 10.1 METs. No specific ischemic changes, frequent PVCs and PACs, 3 beat run of NSVT.    06/2016 Nuclear stress test  ECG nondiagnostic due to IVCD. No arrhythmias noted during exercise.  Blood pressure demonstrated a hypertensive response to exercise.  Medium, moderate intensity, inferior/inferolateral defect extended from apex to base. There is a partially reversible zone in the inferolateral wall at the base. Most suggestive of scar with mild peri-infarct ischemia.  This is an intermediate risk study.  Nuclear stress EF: 40%. Consider confirmation with echocardiography.   07/2016 echo Study Conclusions  - Left ventricle: The cavity size was at the upper limits of normal. Wall thickness was increased in a pattern of mild LVH. The estimated ejection fraction was 40%. Diffuse hypokinesis. There is akinesis of the basalinferior myocardium. Doppler parameters are consistent with abnormal left ventricular relaxation (grade 1 diastolic dysfunction). - Aortic valve: Mildly calcified annulus. Trileaflet. - Mitral valve: Mildly thickened leaflets . There was mild regurgitation. - Left atrium: The atrium was mildly dilated. - Tricuspid valve: There was trivial regurgitation. - Pulmonary arteries: Systolic pressure could not be accurately estimated. - Pericardium, extracardiac: There was no pericardial effusion.  Impressions:  - Mild LVH with upper normal chamber size and LVEF approximately 40%. There is diffuse  hypokinesis with inferior basal akinesis. Probable grade 1 diastolic dysfunction. Mild left atrial enlargement. Mildly thickened mitral leaflets with mild mitral regurgitation.   08/2016 cath  Severe native three-vessel coronary artery disease.  Mid RCA lesion, 100 %stenosed. SVG to PDA is occluded. There are right to right and left to right collaterals.  Mid LAD lesion, 100 %stenosed. LIMA to LAD is atretic. LAD fills from flow from the SVG to diagonal filling the LAD.  Ost 2nd Mrg to 2nd Mrg lesion, 99 %stenosed. SVG to OM2 is  occluded.  Ost 1st Mrg to 1st Mrg lesion, 80 %stenosed. Free RIMA graft to OM 1 is occluded. A STENT PROMUS PREM MR [id number] drug eluting stent was successfully placed., postdilated to > 4mm.  Post intervention, there is a 0% residual stenosis.  Mid Cx lesion, 80 %stenosed. THis was treated with initial cutting balloon angioplasty with a 2.5 mm balloon prior to stent placement in the OM. Then after stent placement, PTCA was done with a 2.0 balloon.  Post intervention, there is a 10% residual stenosis.  The left ventricular ejection fraction is 25-35% by visual estimate.  LV end diastolic pressure is moderately elevated.  There is moderate to severe left ventricular systolic dysfunction.  There is no aortic valve stenosis.  Severe coronary artery disease. Severe graft disease. Only patent graft is the SVG to diagonal which feeds both the diagonal and the LAD. Successful stenting of a large OM1 and balloon angioplasty of the mid circumflex. The RCA territory fills by right to right and left to right collaterals. Continue aggressive secondary prevention. He'll need dual antiplatelet therapy for at least a year. Consider clopidogrel longer-term given the extent of his disease.  Labs/Other Tests and Data Reviewed:    EKG:  No ECG reviewed.  Recent Labs: 02/03/2018: BUN 16; Creatinine, Ser 1.00; Hemoglobin 13.6; Platelets 163; Potassium 3.9; Sodium 142   Recent Lipid Panel No results found for: CHOL, TRIG, HDL, CHOLHDL, LDLCALC, LDLDIRECT  Wt Readings from Last 3 Encounters:  09/02/18 210 lb (95.3 kg)  06/11/18 222 lb 12.8 oz (101.1 kg)  05/19/18 216 lb 12.8 oz (98.3 kg)     Objective:    Vital Signs:   Today's Vitals   10/16/18 0838  BP: 138/80  Pulse: 65  Weight: 215 lb (97.5 kg)  Height: 6\' 2"  (1.88 m)   Body mass index is 27.6 kg/m.  Normal affect. Normal speech pattern and tone. Comfortable, no apparent distress. No audible signs fo SOB or wheezing.   ASSESSMENT &  PLAN:    1. CAD/Chronic systolic HF/ICM -no current symptoms - we will repeat his echo. If LVEF remains decreased would need to consider cath. Would also add aldactone to his regimen. In future pending further workup and clinical course may require EP f/u to consider upgrade from pacemaker to ICD    COVID-19 Education: The signs and symptoms of COVID-19 were discussed with the patient and how to seek care for testing (follow up with PCP or arrange E-visit).  The importance of social distancing was discussed today.  Time:   Today, I have spent 10 minutes with the patient with telehealth technology discussing the above problems.     Medication Adjustments/Labs and Tests Ordered: Current medicines are reviewed at length with the patient today.  Concerns regarding medicines are outlined above.   Tests Ordered: No orders of the defined types were placed in this encounter.   Medication Changes: No orders of the defined types were placed in this  encounter.   Follow Up:  Pending echo results  Signed, Dina RichBranch, Camyah Pultz, MD  10/16/2018 8:17 AM    [facility]

## 2018-10-16 NOTE — Addendum Note (Signed)
Addended by: Julian Hy T on: 10/16/2018 09:24 AM   Modules accepted: Orders

## 2018-10-21 ENCOUNTER — Ambulatory Visit (HOSPITAL_COMMUNITY)
Admission: RE | Admit: 2018-10-21 | Discharge: 2018-10-21 | Disposition: A | Discharge status: Home / Self Care | Payer: Medicare PPO | Source: Ambulatory Visit | Attending: Cardiology | Admitting: Cardiology

## 2018-10-21 ENCOUNTER — Other Ambulatory Visit: Payer: Self-pay

## 2018-10-21 DIAGNOSIS — I5022 Chronic systolic (congestive) heart failure: Secondary | ICD-10-CM | POA: Insufficient documentation

## 2018-10-21 NOTE — Progress Notes (Signed)
*  PRELIMINARY RESULTS* Echocardiogram 2D Echocardiogram has been performed.  Samuel Germany 10/21/2018, 3:16 PM

## 2018-10-28 ENCOUNTER — Telehealth: Payer: Self-pay | Admitting: *Deleted

## 2018-10-28 NOTE — Telephone Encounter (Signed)
Notes recorded by Laurine Blazer, LPN on 0/78/6754 at 4:92 PM EDT  1:48 - lmtcb --agh  ------   Notes recorded by Arnoldo Lenis, MD on 10/26/2018 at 2:21 PM EDT  Echo shows heart function has improved but remains mildly decreased. Essentially it is back to his prior level before he had the drop in November. Overall good news, continue current meds    Zandra Abts MD

## 2018-10-30 NOTE — Telephone Encounter (Signed)
Notes recorded by Laurine Blazer, LPN on 3/81/8403 at 75:43 AM EDT  Patient notified. Copy to pmd.  ------

## 2018-10-30 NOTE — Telephone Encounter (Signed)
Returned call

## 2018-11-12 DIAGNOSIS — H25812 Combined forms of age-related cataract, left eye: Secondary | ICD-10-CM | POA: Diagnosis not present

## 2018-11-12 DIAGNOSIS — Z961 Presence of intraocular lens: Secondary | ICD-10-CM | POA: Diagnosis not present

## 2018-11-12 DIAGNOSIS — H0015 Chalazion left lower eyelid: Secondary | ICD-10-CM | POA: Diagnosis not present

## 2018-11-27 ENCOUNTER — Other Ambulatory Visit: Payer: Self-pay | Admitting: Physician Assistant

## 2018-11-27 DIAGNOSIS — H2512 Age-related nuclear cataract, left eye: Secondary | ICD-10-CM | POA: Diagnosis not present

## 2018-11-27 DIAGNOSIS — H25812 Combined forms of age-related cataract, left eye: Secondary | ICD-10-CM | POA: Diagnosis not present

## 2018-11-30 DIAGNOSIS — E785 Hyperlipidemia, unspecified: Secondary | ICD-10-CM | POA: Diagnosis not present

## 2018-11-30 DIAGNOSIS — F419 Anxiety disorder, unspecified: Secondary | ICD-10-CM | POA: Diagnosis not present

## 2018-11-30 DIAGNOSIS — I739 Peripheral vascular disease, unspecified: Secondary | ICD-10-CM | POA: Diagnosis not present

## 2018-11-30 DIAGNOSIS — I4891 Unspecified atrial fibrillation: Secondary | ICD-10-CM | POA: Diagnosis not present

## 2018-11-30 DIAGNOSIS — I110 Hypertensive heart disease with heart failure: Secondary | ICD-10-CM | POA: Diagnosis not present

## 2018-11-30 DIAGNOSIS — N529 Male erectile dysfunction, unspecified: Secondary | ICD-10-CM | POA: Diagnosis not present

## 2018-11-30 DIAGNOSIS — K219 Gastro-esophageal reflux disease without esophagitis: Secondary | ICD-10-CM | POA: Diagnosis not present

## 2018-11-30 DIAGNOSIS — I509 Heart failure, unspecified: Secondary | ICD-10-CM | POA: Diagnosis not present

## 2018-11-30 DIAGNOSIS — I2510 Atherosclerotic heart disease of native coronary artery without angina pectoris: Secondary | ICD-10-CM | POA: Diagnosis not present

## 2018-11-30 DIAGNOSIS — Z951 Presence of aortocoronary bypass graft: Secondary | ICD-10-CM | POA: Diagnosis not present

## 2018-12-04 DIAGNOSIS — R69 Illness, unspecified: Secondary | ICD-10-CM | POA: Diagnosis not present

## 2018-12-15 ENCOUNTER — Ambulatory Visit (INDEPENDENT_AMBULATORY_CARE_PROVIDER_SITE_OTHER): Payer: Medicare PPO | Admitting: *Deleted

## 2018-12-15 DIAGNOSIS — I4891 Unspecified atrial fibrillation: Secondary | ICD-10-CM

## 2018-12-15 DIAGNOSIS — R001 Bradycardia, unspecified: Secondary | ICD-10-CM

## 2018-12-18 LAB — CUP PACEART REMOTE DEVICE CHECK
Battery Remaining Longevity: 87 mo
Battery Remaining Percentage: 95.5 %
Battery Voltage: 2.98 V
Brady Statistic AP VP Percent: 42 %
Brady Statistic AP VS Percent: 1 %
Brady Statistic AS VP Percent: 50 %
Brady Statistic AS VS Percent: 2.2 %
Brady Statistic RA Percent Paced: 1 %
Date Time Interrogation Session: 20200814044206
Implantable Lead Implant Date: 20190822
Implantable Lead Implant Date: 20190822
Implantable Lead Implant Date: 20190822
Implantable Lead Location: 753858
Implantable Lead Location: 753859
Implantable Lead Location: 753860
Implantable Pulse Generator Implant Date: 20190822
Lead Channel Impedance Value: 540 Ohm
Lead Channel Impedance Value: 550 Ohm
Lead Channel Impedance Value: 890 Ohm
Lead Channel Pacing Threshold Amplitude: 0.5 V
Lead Channel Pacing Threshold Amplitude: 1.5 V
Lead Channel Pacing Threshold Pulse Width: 0.5 ms
Lead Channel Pacing Threshold Pulse Width: 0.5 ms
Lead Channel Sensing Intrinsic Amplitude: 5 mV
Lead Channel Sensing Intrinsic Amplitude: 9.3 mV
Lead Channel Setting Pacing Amplitude: 2 V
Lead Channel Setting Pacing Amplitude: 2.5 V
Lead Channel Setting Pacing Amplitude: 3.5 V
Lead Channel Setting Pacing Pulse Width: 0.5 ms
Lead Channel Setting Pacing Pulse Width: 0.5 ms
Lead Channel Setting Sensing Sensitivity: 2 mV
Pulse Gen Serial Number: 9056721

## 2018-12-23 ENCOUNTER — Telehealth: Payer: Self-pay | Admitting: *Deleted

## 2018-12-23 ENCOUNTER — Other Ambulatory Visit: Payer: Self-pay | Admitting: *Deleted

## 2018-12-23 MED ORDER — RIVAROXABAN 20 MG PO TABS: 20.0000 mg | ORAL_TABLET | Freq: Every day | ORAL | 3 refills | Status: DC

## 2018-12-23 NOTE — Telephone Encounter (Signed)
-----   Message from Arnoldo Lenis, MD sent at 12/23/2018  9:06 AM EDT ----- November   JB ----- Message ----- From: Massie Maroon, CMA Sent: 12/10/2018   9:09 AM EDT To: Arnoldo Lenis, MD  When did you want to f/u with this patient?

## 2018-12-23 NOTE — Progress Notes (Signed)
Remote pacemaker transmission.   

## 2018-12-23 NOTE — Telephone Encounter (Signed)
appt scheduled

## 2018-12-25 ENCOUNTER — Other Ambulatory Visit: Payer: Self-pay

## 2018-12-25 MED ORDER — RIVAROXABAN 20 MG PO TABS: 20.0000 mg | ORAL_TABLET | Freq: Every day | ORAL | 3 refills | Status: DC

## 2018-12-25 NOTE — Telephone Encounter (Signed)
Rx(s) sent to pharmacy electronically.  

## 2019-01-07 ENCOUNTER — Other Ambulatory Visit: Payer: Self-pay | Admitting: Cardiovascular Disease

## 2019-01-15 ENCOUNTER — Other Ambulatory Visit: Payer: Self-pay | Admitting: Cardiology

## 2019-01-19 ENCOUNTER — Encounter: Payer: Self-pay | Admitting: Internal Medicine

## 2019-01-19 ENCOUNTER — Other Ambulatory Visit: Payer: Self-pay

## 2019-01-19 ENCOUNTER — Ambulatory Visit (INDEPENDENT_AMBULATORY_CARE_PROVIDER_SITE_OTHER): Payer: Medicare PPO | Admitting: Internal Medicine

## 2019-01-19 VITALS — BP 145/82 | HR 88 | Temp 96.8°F | Ht 74.0 in | Wt 227.0 lb

## 2019-01-19 DIAGNOSIS — I5022 Chronic systolic (congestive) heart failure: Secondary | ICD-10-CM

## 2019-01-19 DIAGNOSIS — Z950 Presence of cardiac pacemaker: Secondary | ICD-10-CM | POA: Diagnosis not present

## 2019-01-19 DIAGNOSIS — I4891 Unspecified atrial fibrillation: Secondary | ICD-10-CM | POA: Diagnosis not present

## 2019-01-19 DIAGNOSIS — E785 Hyperlipidemia, unspecified: Secondary | ICD-10-CM | POA: Diagnosis not present

## 2019-01-19 NOTE — Patient Instructions (Signed)
Medication Instructions: Your physician recommends that you continue on your current medications as directed. Please refer to the Current Medication list given to you today.   Labwork:  None  Procedures/Testing: None  Follow-Up:  1 year with Dr.Taylor  Any Additional Special Instructions Will Be Listed Below (If Applicable).     If you need a refill on your cardiac medications before your next appointment, please call your pharmacy.     Thank you for choosing [facility] !         

## 2019-01-19 NOTE — Progress Notes (Signed)
HPI [name] returns today for followup of his biv ppm for CHF. He also has persistent atrial fib. He has undergone DCCV but did not maintain NSR. He feels well and denies chest pain or peripheral edema. No sob. 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 He has not had any URI symptoms. He does not feel his atrial fib. Allergies  Allergen Reactions  . Acetaminophen Other (See Comments)    Hallucinations     Current Outpatient Medications  Medication Sig Dispense Refill  . atorvastatin (LIPITOR) 80 MG tablet TAKE 1 TABLET BY MOUTH EVERY DAY 90 tablet 3  . clonazePAM (KLONOPIN) 0.5 MG tablet Take 0.5 mg by mouth at bedtime as needed for anxiety.    Marland Kitchen. ENTRESTO 97-103 MG TAKE 1 TABLET BY MOUTH TWICE A DAY 60 tablet 3  . furosemide (LASIX) 20 MG tablet Take 1 tablet (20 mg total) by mouth daily. 90 tablet 3  . metoprolol (TOPROL-XL) 200 MG 24 hr tablet TAKE 1 TABLET BY MOUTH EVERY DAY 90 tablet 1  . Multiple Vitamin (MULTIVITAMIN) tablet Take 1 tablet by mouth daily.     . nitroGLYCERIN (NITROSTAT) 0.4 MG SL tablet Place 0.4 mg under the tongue every 5 (five) minutes as needed for chest pain.     . pantoprazole (PROTONIX) 40 MG tablet TAKE 1 TABLET BY MOUTH EVERY DAY 90 tablet 1  . rivaroxaban (XARELTO) 20 MG TABS tablet Take 1 tablet (20 mg total) by mouth daily. 90 tablet 3  . sildenafil (REVATIO) 20 MG tablet Take 20-100 mg by mouth as needed (30 minutes prior to sexual activity).   6  . potassium chloride (K-DUR) 10 MEQ tablet Take 1 tablet (10 mEq total) by mouth daily. 90 tablet 3   No current facility-administered medications for this visit.      Past Medical History:  Diagnosis Date  . AF (atrial fibrillation) (HCC) 12/29/2017  . CAD in native artery    08/05/16 Cath, atresia of LIMA to LAD, occlusion of SVG-->RCA, SVG-->Circumflex/OM2, SVG-->OM1, patent SVG to diag, DES to native circumflex.   . Erectile dysfunction   . Hyperlipidemia,  mixed   . Myocardial infarction [facility](HCC) 2001   Out of hospital myocardial infarction/inferior wall, status post Cardiolite study 2007.  Ejection fraction 45%  . Pacemaker 12/2017  . Unspecified essential hypertension     ROS:   All systems reviewed and negative except as noted in the HPI.   Past Surgical History:  Procedure Laterality Date  . BIV PACEMAKER INSERTION CRT-P N/A 12/25/2017   Procedure: BIV PACEMAKER INSERTION CRT-P;  Surgeon: Marinus Mawaylor, Kensleigh Gates W, MD;  [facility];  Service: Cardiovascular;  Laterality: N/A;  . CARDIOVERSION N/A 02/04/2018   Procedure: CARDIOVERSION;  Surgeon: Antoine PocheBranch, Jonathan F, MD;  [facility];  Service: Endoscopy;  Laterality: N/A;  . CATARACT EXTRACTION    . CORONARY ARTERY BYPASS GRAFT  2001   2001.  LIMA to LAD, SVG to OM1, SVG to OM 2, SVG to diagonal, SVG to PDA.  Marland Kitchen. CORONARY BALLOON ANGIOPLASTY N/A 08/05/2016   Procedure: Coronary Balloon Angioplasty;  Surgeon: Corky CraftsJayadeep S Varanasi, MD;  [facility];  Service: Cardiovascular;  Laterality: N/A;  CFX  . CORONARY STENT INTERVENTION N/A 08/05/2016   Procedure: Coronary Stent Intervention;  Surgeon: Corky CraftsJayadeep S Varanasi, MD;  [facility];  Service: Cardiovascular;  Laterality: N/A;  OM  . LAPAROSCOPIC CHOLECYSTECTOMY  06/22/2013   Dr. Truddie Hidden  . LEFT HEART CATH AND CORS/GRAFTS ANGIOGRAPHY N/A 08/05/2016   Procedure: Left Heart Cath and Cors/Grafts Angiography;  Surgeon: Jettie Booze, MD;  Location: [place] CV LAB;  Service: Cardiovascular;  Laterality: N/A;     Family History  Problem Relation Age of Onset  . Alzheimer's disease Mother   . Brain cancer Mother        brain tumor  . Stroke Father   . Alzheimer's disease Father      Social History   Socioeconomic History  . Marital status: Married    Spouse name: Not on file  . Number of children: Not on file  . Years of education: Not on file  . Highest education level: Not on  file  Occupational History  . Occupation: Full time    Employer: [employer]  Social Needs  . Financial resource strain: Not on file  . Food insecurity    Worry: Not on file    Inability: Not on file  . Transportation needs    Medical: Not on file    Non-medical: Not on file  Tobacco Use  . Smoking status: Never Smoker  . Smokeless tobacco: Never Used  Substance and Sexual Activity  . Alcohol use: Yes    Alcohol/week: 0.0 standard drinks    Comment: RARE  . Drug use: No  . Sexual activity: Not on file  Lifestyle  . Physical activity    Days per week: Not on file    Minutes per session: Not on file  . Stress: Not on file  Relationships  . Social Herbalist on phone: Not on file    Gets together: Not on file    Attends religious service: Not on file    Active member of club or organization: Not on file    Attends meetings of clubs or organizations: Not on file    Relationship status: Not on file  . Intimate partner violence    Fear of current or ex partner: Not on file    Emotionally abused: Not on file    Physically abused: Not on file    Forced sexual activity: Not on file  Other Topics Concern  . Not on file  Social History Narrative  . Not on file     BP (!) 145/82   Pulse 88   Temp (!) 96.8 F (36 C) (Temporal)   Ht 6\' 2"  (1.88 m)   Wt 227 lb (103 kg)   SpO2 98%   BMI 29.15 kg/m   Physical Exam:  Well appearing NAD HEENT: Unremarkable Neck:  No JVD, no thyromegally Lymphatics:  No adenopathy Back:  No CVA tenderness Lungs:  Clear HEART:  Regular rate rhythm, no murmurs, no rubs, no clicks Abd:  soft, positive bowel sounds, no organomegally, no rebound, no guarding Ext:  2 plus pulses, no edema, no cyanosis, no clubbing Skin:  No rashes no nodules Neuro:  CN II through XII intact, motor grossly intact   DEVICE  Normal device function.  See PaceArt for details.   Assess/Plan: 1. Persistent atrial fib - his rate is well controlled.  No change in meds.  2. Chronic systolic heart failure - his symptoms are class 2A. He will continue his maximal medical therapy under the direction of Dr. Harl Bowie. 3. Coags - he has had no bleeding on Xarelto. 4. PPM - His [employer] Biv PPM is working normally. We will recheck in several months.  Mikle Bosworth.D.

## 2019-01-25 DIAGNOSIS — Z23 Encounter for immunization: Secondary | ICD-10-CM | POA: Diagnosis not present

## 2019-01-31 ENCOUNTER — Other Ambulatory Visit: Payer: Self-pay | Admitting: Cardiology

## 2019-02-03 DIAGNOSIS — E785 Hyperlipidemia, unspecified: Secondary | ICD-10-CM | POA: Diagnosis not present

## 2019-02-03 DIAGNOSIS — I10 Essential (primary) hypertension: Secondary | ICD-10-CM | POA: Diagnosis not present

## 2019-02-12 ENCOUNTER — Other Ambulatory Visit: Payer: Self-pay | Admitting: *Deleted

## 2019-02-12 MED ORDER — PANTOPRAZOLE SODIUM 40 MG PO TBEC: 40.0000 mg | DELAYED_RELEASE_TABLET | Freq: Every day | ORAL | 2 refills | Status: DC

## 2019-03-02 DIAGNOSIS — C44329 Squamous cell carcinoma of skin of other parts of face: Secondary | ICD-10-CM | POA: Diagnosis not present

## 2019-03-02 DIAGNOSIS — C44719 Basal cell carcinoma of skin of left lower limb, including hip: Secondary | ICD-10-CM | POA: Diagnosis not present

## 2019-03-02 DIAGNOSIS — C44729 Squamous cell carcinoma of skin of left lower limb, including hip: Secondary | ICD-10-CM | POA: Diagnosis not present

## 2019-03-02 DIAGNOSIS — L570 Actinic keratosis: Secondary | ICD-10-CM | POA: Diagnosis not present

## 2019-03-02 DIAGNOSIS — D0439 Carcinoma in situ of skin of other parts of face: Secondary | ICD-10-CM | POA: Diagnosis not present

## 2019-03-10 ENCOUNTER — Telehealth (INDEPENDENT_AMBULATORY_CARE_PROVIDER_SITE_OTHER): Payer: Medicare PPO | Admitting: Cardiology

## 2019-03-10 ENCOUNTER — Encounter: Payer: Self-pay | Admitting: Cardiology

## 2019-03-10 VITALS — BP 130/85 | HR 70 | Ht 74.0 in | Wt 218.0 lb

## 2019-03-10 DIAGNOSIS — I5022 Chronic systolic (congestive) heart failure: Secondary | ICD-10-CM

## 2019-03-10 DIAGNOSIS — I4891 Unspecified atrial fibrillation: Secondary | ICD-10-CM

## 2019-03-10 DIAGNOSIS — R001 Bradycardia, unspecified: Secondary | ICD-10-CM

## 2019-03-10 DIAGNOSIS — I2510 Atherosclerotic heart disease of native coronary artery without angina pectoris: Secondary | ICD-10-CM

## 2019-03-10 MED ORDER — SPIRONOLACTONE 25 MG PO TABS: 12.5000 mg | ORAL_TABLET | Freq: Every day | ORAL | 1 refills | Status: DC

## 2019-03-10 NOTE — Progress Notes (Signed)
Virtual Visit via Telephone Note   This visit type was conducted due to national recommendations for restrictions regarding the COVID-19 Pandemic (e.g. social distancing) in an effort to limit this patient's exposure and mitigate transmission in our community.  Due to his co-morbid illnesses, this patient is at least at moderate risk for complications without adequate follow up.  This format is felt to be most appropriate for this patient at this time.  The patient did not have access to video technology/had technical difficulties with video requiring transitioning to audio format only (telephone).  All issues noted in this document were discussed and addressed.  No physical exam could be performed with this format.  Please refer to the patient's chart for his  consent to telehealth for [facility].   Date:  03/10/2019   ID:  Michael Melton, DOB 01-16-1951, MRN [id number]  Patient Location: Home Provider Location: Office  PCP:  Manon Hilding, MD  Cardiologist:  Carlyle Dolly, MD  Electrophysiologist:  Cristopher Peru, MD   Evaluation Performed:  Follow-Up Visit  Chief Complaint:  Follow up visit  History of Present Illness:    Michael Melton is a 68 y.o. male seen today for a focused visit for his history of chronic systolic HF/ICM.   1. CAD/ICM/Chronic systolic HF - hx of CABG in 2001, at [facility].  - GXT 09/2012 with no ischemic changes, did have frequent PVCs, 3 beat run of NSVT - echo 01/2013 LVEF 50%, grade I diastoilc dysfunction.  - echo 07/2016 LVEF 03%, grade I diastolic dysfunction - 09/4654 nuclear stress primarily scar with mild peri-infarct ischemia. LVEF 40%, intermediate risk.    - cath 08/2016: mid LAD occluded, mid LCX 80%, RCA 100%. OM 1 80%. OM2 99%. SVG-RPDA occluded, LIMA-LAD atretic and occluded in midportion, LAD fills from SVG-diag. SVG-D2 patent, RIMA-OM1 occluded, SVG-OM2 occluded. RCA territory fills by collaterals Received angiolpasty to LCX, OM1  80% receivded DES. Recs for indefinite plavix.  03/2018 echo LVEF 25-30%, diffuse hypokinesis, grade II diastoilc dysfunction, mild MR   - we discussed repeat cath last visit due to drop in LVEF however he was reluctant at the time.Plan was to optimize medical therapy and recheck LVEF.   10/2018 echo LVEF 40-45%  - no recent SOB/DOE. No LE edema. No recent chest pain - compliant with meds.    2. Symptomatic bradycardia - s/p pacemaker placement 12/2017, [employer] Jude BiV pacemaker for complete AV block. At time of implant LVEF was 40% - normal device check 01/2019  -no recent symptoms.    3. Afib - new diagnosis during 12/2017 admission - s/p DCCV 02/04/18.Returned back to afib. Plan for rate control strategy  - no recent palpitations. No bleeding on xarelto.     CL:EXNTZGYF got married in 07/2018, small ceremony due to COVID-19. Had to cancel the family cruise after       The patient does not have symptoms concerning for COVID-19 infection (fever, chills, cough, or new shortness of breath).    Past Medical History:  Diagnosis Date  . AF (atrial fibrillation) ([place]) 12/29/2017  . CAD in native artery    08/05/16 Cath, atresia of LIMA to LAD, occlusion of SVG-->RCA, SVG-->Circumflex/OM2, SVG-->OM1, patent SVG to diag, DES to native circumflex.   . Erectile dysfunction   . Hyperlipidemia, mixed   . Myocardial infarction [facility]) 2001   Out of hospital myocardial infarction/inferior wall, status post Cardiolite study 2007.  Ejection fraction 45%  . Pacemaker 12/2017  . Unspecified  essential hypertension    Past Surgical History:  Procedure Laterality Date  . BIV PACEMAKER INSERTION CRT-P N/A 12/25/2017   Procedure: BIV PACEMAKER INSERTION CRT-P;  Surgeon: Marinus Mawaylor, Gregg W, MD;  [facility];  Service: Cardiovascular;  Laterality: N/A;  . CARDIOVERSION N/A 02/04/2018   Procedure: CARDIOVERSION;  Surgeon: Antoine PocheBranch, Colie Fugitt F, MD;  Location: [facility] ENDO SUITE;   Service: Endoscopy;  Laterality: N/A;  . CATARACT EXTRACTION    . CORONARY ARTERY BYPASS GRAFT  2001   2001.  LIMA to LAD, SVG to OM1, SVG to OM 2, SVG to diagonal, SVG to PDA.  Marland Kitchen. CORONARY BALLOON ANGIOPLASTY N/A 08/05/2016   Procedure: Coronary Balloon Angioplasty;  Surgeon: Corky CraftsJayadeep S Varanasi, MD;  [facility];  Service: Cardiovascular;  Laterality: N/A;  CFX  . CORONARY STENT INTERVENTION N/A 08/05/2016   Procedure: Coronary Stent Intervention;  Surgeon: Corky CraftsJayadeep S Varanasi, MD;  [facility];  Service: Cardiovascular;  Laterality: N/A;  OM  . LAPAROSCOPIC CHOLECYSTECTOMY  06/22/2013   Dr. Cristy FolksBeacham  . LEFT HEART CATH AND CORS/GRAFTS ANGIOGRAPHY N/A 08/05/2016   Procedure: Left Heart Cath and Cors/Grafts Angiography;  Surgeon: Corky CraftsJayadeep S Varanasi, MD;  [facility];  Service: Cardiovascular;  Laterality: N/A;     Current Meds  Medication Sig  . atorvastatin (LIPITOR) 80 MG tablet TAKE 1 TABLET BY MOUTH EVERY DAY  . clonazePAM (KLONOPIN) 0.5 MG tablet Take 0.5 mg by mouth at bedtime as needed for anxiety.  Marland Kitchen. ENTRESTO 97-103 MG TAKE 1 TABLET BY MOUTH TWICE A DAY  . metoprolol (TOPROL-XL) 200 MG 24 hr tablet TAKE 1 TABLET BY MOUTH EVERY DAY  . Multiple Vitamin (MULTIVITAMIN) tablet Take 1 tablet by mouth daily.   . nitroGLYCERIN (NITROSTAT) 0.4 MG SL tablet Place 0.4 mg under the tongue every 5 (five) minutes as needed for chest pain.   . pantoprazole (PROTONIX) 40 MG tablet Take 1 tablet (40 mg total) by mouth daily.  . rivaroxaban (XARELTO) 20 MG TABS tablet Take 1 tablet (20 mg total) by mouth daily.  . sildenafil (REVATIO) 20 MG tablet Take 20-100 mg by mouth as needed (30 minutes prior to sexual activity).   . [DISCONTINUED] furosemide (LASIX) 20 MG tablet Take 1 tablet (20 mg total) by mouth daily.     Allergies:   Acetaminophen   Social History   Tobacco Use  . Smoking status: Never Smoker  . Smokeless tobacco: Never Used  Substance  Use Topics  . Alcohol use: Yes    Alcohol/week: 0.0 standard drinks    Comment: RARE  . Drug use: No     Family Hx: The patient's family history includes Alzheimer's disease in his father and mother; Brain cancer in his mother; Stroke in his father.  ROS:   Please see the history of present illness.     All other systems reviewed and are negative.   Prior CV studies:   The following studies were reviewed today:  01/2013 Echo Study Conclusions  - Left ventricle: Systolic function is low normal, with an estimated EF of 50-55%, but closer to 50%. There is mild global hypokinesis. The cavity size was normal. Wall thickness was increased in a pattern of mild LVH. There was an increased relative contribution of atrial contraction to ventricular filling. Doppler parameters are consistent with abnormal left ventricular relaxation (grade 1 diastolic dysfunction). - Ventricular septum: Motion consistent with post-operative changes. The contour showed diastolic flattening. - Left atrium: The  atrium was mildly dilated.   09/2012 ETT Exc 6 min 50 sec, 10.1 METs. No specific ischemic changes, frequent PVCs and PACs, 3 beat run of NSVT.   06/2016 Nuclear stress test  ECG nondiagnostic due to IVCD. No arrhythmias noted during exercise.  Blood pressure demonstrated a hypertensive response to exercise.  Medium, moderate intensity, inferior/inferolateral defect extended from apex to base. There is a partially reversible zone in the inferolateral wall at the base. Most suggestive of scar with mild peri-infarct ischemia.  This is an intermediate risk study.  Nuclear stress EF: 40%. Consider confirmation with echocardiography.   07/2016 echo Study Conclusions  - Left ventricle: The cavity size was at the upper limits of normal. Wall thickness was increased in a pattern of mild LVH. The estimated ejection fraction was 40%. Diffuse hypokinesis. There is akinesis of the  basalinferior myocardium. Doppler parameters are consistent with abnormal left ventricular relaxation (grade 1 diastolic dysfunction). - Aortic valve: Mildly calcified annulus. Trileaflet. - Mitral valve: Mildly thickened leaflets . There was mild regurgitation. - Left atrium: The atrium was mildly dilated. - Tricuspid valve: There was trivial regurgitation. - Pulmonary arteries: Systolic pressure could not be accurately estimated. - Pericardium, extracardiac: There was no pericardial effusion.  Impressions:  - Mild LVH with upper normal chamber size and LVEF approximately 40%. There is diffuse hypokinesis with inferior basal akinesis. Probable grade 1 diastolic dysfunction. Mild left atrial enlargement. Mildly thickened mitral leaflets with mild mitral regurgitation.   08/2016 cath  Severe native three-vessel coronary artery disease.  Mid RCA lesion, 100 %stenosed. SVG to PDA is occluded. There are right to right and left to right collaterals.  Mid LAD lesion, 100 %stenosed. LIMA to LAD is atretic. LAD fills from flow from the SVG to diagonal filling the LAD.  Ost 2nd Mrg to 2nd Mrg lesion, 99 %stenosed. SVG to OM2 is occluded.  Ost 1st Mrg to 1st Mrg lesion, 80 %stenosed. Free RIMA graft to OM 1 is occluded. A STENT PROMUS PREM MR 3.5X28 drug eluting stent was successfully placed., postdilated to > 20mm.  Post intervention, there is a 0% residual stenosis.  Mid Cx lesion, 80 %stenosed. THis was treated with initial cutting balloon angioplasty with a 2.5 mm balloon prior to stent placement in the OM. Then after stent placement, PTCA was done with a 2.0 balloon.  Post intervention, there is a 10% residual stenosis.  The left ventricular ejection fraction is 25-35% by visual estimate.  LV end diastolic pressure is moderately elevated.  There is moderate to severe left ventricular systolic dysfunction.  There is no aortic valve stenosis.  Severe  coronary artery disease. Severe graft disease. Only patent graft is the SVG to diagonal which feeds both the diagonal and the LAD. Successful stenting of a large OM1 and balloon angioplasty of the mid circumflex. The RCA territory fills by right to right and left to right collaterals. Continue aggressive secondary prevention. He'll need dual antiplatelet therapy for at least a year. Consider clopidogrel longer-term given the extent of his disease.   10/2018 echo IMPRESSIONS    1. The left ventricle has mild-moderately reduced systolic function, with an ejection fraction of 40-45%. The cavity size was normal. There is mildly increased left ventricular wall thickness. Left ventricular diastolic Doppler parameters are  indeterminate.  2. Left atrial size was severely dilated.  3. Right atrial size was moderately dilated.  4. The aortic valve is tricuspid. Mild thickening of the aortic valve. Mild calcification of the aortic valve.  No stenosis of the aortic valve. Mild aortic annular calcification noted.  5. The mitral valve is abnormal. Mild thickening of the mitral valve leaflet. Mild calcification of the mitral valve leaflet. There is mild mitral annular calcification present. No evidence of mitral valve stenosis.  6. The aortic root is normal in size and structure.  7. Pulmonary hypertension is indeterminant, inadequate TR jet.  8. The interatrial septum was not well visualized.    Labs/Other Tests and Data Reviewed:    EKG:  No ECG reviewed.  Recent Labs: No results found for requested labs within last 8760 hours.   Recent Lipid Panel No results found for: CHOL, TRIG, HDL, CHOLHDL, LDLCALC, LDLDIRECT  Wt Readings from Last 3 Encounters:  03/10/19 218 lb (98.9 kg)  01/19/19 227 lb (103 kg)  10/16/18 215 lb (97.5 kg)     Objective:    Vital Signs:  BP 130/85   Pulse 70   Ht  (1.88 m)   Wt 218 lb (98.9 kg)   BMI 27.99 kg/m    Normal affect. Normal speech pattern and  tone. COmfortable, no apparent distress. No audible signs of SOB or wheezing.   ASSESSMENT & PLAN:    1. CAD/Chronic systolic HF/ICM -no current symptoms - has had some improvement in LVEF by last check back to 40-45% - we will start aldactone 12.5mg  daily, check bmet in 2 weeks  2. Afib - no recent symptoms, continue current meds including anticoag  3. Bradycardia - normal pacemaker by recent check, continue to follow with EP   COVID-19 Education: The signs and symptoms of COVID-19 were discussed with the patient and how to seek care for testing (follow up with PCP or arrange E-visit).  The importance of social distancing was discussed today.  Time:   Today, I have spent 16 minutes with the patient with telehealth technology discussing the above problems.     Medication Adjustments/Labs and Tests Ordered: Current medicines are reviewed at length with the patient today.  Concerns regarding medicines are outlined above.   Tests Ordered: No orders of the defined types were placed in this encounter.   Medication Changes: No orders of the defined types were placed in this encounter.   Follow Up:  In Person in 4 month(s)  Signed, Dina Rich, MD  03/10/2019 8:11 AM    [facility]

## 2019-03-10 NOTE — Patient Instructions (Signed)
Your physician recommends that you schedule a follow-up appointment in: 4 MONTHS WITH DR BRANCH  Your physician has recommended you make the following change in your medication:   START ALDACTONE 12.5 MG (1/2 TABLET) DAILY   Your physician recommends that you return for lab work in: 2 WEEKS BMP  Thank you for choosing [facility]!!     

## 2019-03-10 NOTE — Addendum Note (Signed)
Addended by: Julian Hy T on: 03/10/2019 08:41 AM   Modules accepted: Orders

## 2019-03-11 DIAGNOSIS — C44729 Squamous cell carcinoma of skin of left lower limb, including hip: Secondary | ICD-10-CM | POA: Diagnosis not present

## 2019-03-11 DIAGNOSIS — C44329 Squamous cell carcinoma of skin of other parts of face: Secondary | ICD-10-CM | POA: Diagnosis not present

## 2019-03-17 ENCOUNTER — Ambulatory Visit (INDEPENDENT_AMBULATORY_CARE_PROVIDER_SITE_OTHER): Payer: Medicare PPO | Admitting: *Deleted

## 2019-03-17 DIAGNOSIS — I5042 Chronic combined systolic (congestive) and diastolic (congestive) heart failure: Secondary | ICD-10-CM

## 2019-03-17 DIAGNOSIS — I4891 Unspecified atrial fibrillation: Secondary | ICD-10-CM

## 2019-03-17 LAB — CUP PACEART REMOTE DEVICE CHECK
Battery Remaining Longevity: 92 mo
Battery Remaining Percentage: 95.5 %
Battery Voltage: 2.98 V
Brady Statistic AP VP Percent: 0 %
Brady Statistic AP VS Percent: 0 %
Brady Statistic AS VP Percent: 0 %
Brady Statistic AS VS Percent: 0 %
Brady Statistic RA Percent Paced: 1 %
Date Time Interrogation Session: 20201109072344
Implantable Lead Implant Date: 20190822
Implantable Lead Implant Date: 20190822
Implantable Lead Implant Date: 20190822
Implantable Lead Location: 753858
Implantable Lead Location: 753859
Implantable Lead Location: 753860
Implantable Pulse Generator Implant Date: 20190822
Lead Channel Impedance Value: 1000 Ohm
Lead Channel Impedance Value: 560 Ohm
Lead Channel Impedance Value: 590 Ohm
Lead Channel Pacing Threshold Amplitude: 0.5 V
Lead Channel Pacing Threshold Amplitude: 1.5 V
Lead Channel Pacing Threshold Pulse Width: 0.5 ms
Lead Channel Pacing Threshold Pulse Width: 0.5 ms
Lead Channel Sensing Intrinsic Amplitude: 12 mV
Lead Channel Sensing Intrinsic Amplitude: 5 mV
Lead Channel Setting Pacing Amplitude: 2 V
Lead Channel Setting Pacing Amplitude: 2.5 V
Lead Channel Setting Pacing Amplitude: 3.5 V
Lead Channel Setting Pacing Pulse Width: 0.5 ms
Lead Channel Setting Pacing Pulse Width: 0.5 ms
Lead Channel Setting Sensing Sensitivity: 2 mV
Pulse Gen Serial Number: 9056721

## 2019-04-07 NOTE — Progress Notes (Signed)
Remote pacemaker transmission.   

## 2019-05-04 ENCOUNTER — Other Ambulatory Visit: Payer: Self-pay | Admitting: Cardiovascular Disease

## 2019-05-17 ENCOUNTER — Encounter: Payer: Self-pay | Admitting: *Deleted

## 2019-05-20 DIAGNOSIS — L570 Actinic keratosis: Secondary | ICD-10-CM | POA: Diagnosis not present

## 2019-05-31 ENCOUNTER — Other Ambulatory Visit: Payer: Self-pay | Admitting: Cardiology

## 2019-06-04 DIAGNOSIS — E7849 Other hyperlipidemia: Secondary | ICD-10-CM | POA: Diagnosis not present

## 2019-06-04 DIAGNOSIS — I2510 Atherosclerotic heart disease of native coronary artery without angina pectoris: Secondary | ICD-10-CM | POA: Diagnosis not present

## 2019-06-15 ENCOUNTER — Other Ambulatory Visit: Payer: Self-pay | Admitting: *Deleted

## 2019-06-15 MED ORDER — ATORVASTATIN CALCIUM 80 MG PO TABS: 80.0000 mg | ORAL_TABLET | Freq: Every day | ORAL | 3 refills | Status: DC

## 2019-06-16 ENCOUNTER — Ambulatory Visit (INDEPENDENT_AMBULATORY_CARE_PROVIDER_SITE_OTHER): Payer: Medicare PPO | Admitting: *Deleted

## 2019-06-16 DIAGNOSIS — I4891 Unspecified atrial fibrillation: Secondary | ICD-10-CM | POA: Diagnosis not present

## 2019-06-16 LAB — CUP PACEART REMOTE DEVICE CHECK
Battery Remaining Longevity: 90 mo
Battery Remaining Percentage: 95.5 %
Battery Voltage: 2.98 V
Brady Statistic AP VP Percent: 0 %
Brady Statistic AP VS Percent: 0 %
Brady Statistic AS VP Percent: 0 %
Brady Statistic AS VS Percent: 0 %
Brady Statistic RA Percent Paced: 1 %
Date Time Interrogation Session: 20210210030627
Implantable Lead Implant Date: 20190822
Implantable Lead Implant Date: 20190822
Implantable Lead Implant Date: 20190822
Implantable Lead Location: 753858
Implantable Lead Location: 753859
Implantable Lead Location: 753860
Implantable Pulse Generator Implant Date: 20190822
Lead Channel Impedance Value: 540 Ohm
Lead Channel Impedance Value: 550 Ohm
Lead Channel Impedance Value: 950 Ohm
Lead Channel Pacing Threshold Amplitude: 0.625 V
Lead Channel Pacing Threshold Amplitude: 1.5 V
Lead Channel Pacing Threshold Pulse Width: 0.5 ms
Lead Channel Pacing Threshold Pulse Width: 0.5 ms
Lead Channel Sensing Intrinsic Amplitude: 5 mV
Lead Channel Sensing Intrinsic Amplitude: 8.6 mV
Lead Channel Setting Pacing Amplitude: 2 V
Lead Channel Setting Pacing Amplitude: 2.5 V
Lead Channel Setting Pacing Amplitude: 3.5 V
Lead Channel Setting Pacing Pulse Width: 0.5 ms
Lead Channel Setting Pacing Pulse Width: 0.5 ms
Lead Channel Setting Sensing Sensitivity: 2 mV
Pulse Gen Model: 3562
Pulse Gen Serial Number: 9056721

## 2019-06-16 NOTE — Progress Notes (Signed)
PPM Remote  

## 2019-07-05 DIAGNOSIS — Z955 Presence of coronary angioplasty implant and graft: Secondary | ICD-10-CM | POA: Diagnosis not present

## 2019-07-05 DIAGNOSIS — I2510 Atherosclerotic heart disease of native coronary artery without angina pectoris: Secondary | ICD-10-CM | POA: Diagnosis not present

## 2019-07-05 DIAGNOSIS — I509 Heart failure, unspecified: Secondary | ICD-10-CM | POA: Diagnosis not present

## 2019-07-05 DIAGNOSIS — Z951 Presence of aortocoronary bypass graft: Secondary | ICD-10-CM | POA: Diagnosis not present

## 2019-07-05 DIAGNOSIS — Z9841 Cataract extraction status, right eye: Secondary | ICD-10-CM | POA: Diagnosis not present

## 2019-07-05 DIAGNOSIS — R600 Localized edema: Secondary | ICD-10-CM | POA: Diagnosis not present

## 2019-07-05 DIAGNOSIS — N529 Male erectile dysfunction, unspecified: Secondary | ICD-10-CM | POA: Diagnosis not present

## 2019-07-05 DIAGNOSIS — Z9842 Cataract extraction status, left eye: Secondary | ICD-10-CM | POA: Diagnosis not present

## 2019-07-05 DIAGNOSIS — E785 Hyperlipidemia, unspecified: Secondary | ICD-10-CM | POA: Diagnosis not present

## 2019-07-05 DIAGNOSIS — I4891 Unspecified atrial fibrillation: Secondary | ICD-10-CM | POA: Diagnosis not present

## 2019-07-05 DIAGNOSIS — I739 Peripheral vascular disease, unspecified: Secondary | ICD-10-CM | POA: Diagnosis not present

## 2019-07-05 DIAGNOSIS — I110 Hypertensive heart disease with heart failure: Secondary | ICD-10-CM | POA: Diagnosis not present

## 2019-07-05 DIAGNOSIS — K219 Gastro-esophageal reflux disease without esophagitis: Secondary | ICD-10-CM | POA: Diagnosis not present

## 2019-07-12 ENCOUNTER — Encounter: Payer: Self-pay | Admitting: Cardiology

## 2019-07-12 ENCOUNTER — Ambulatory Visit (INDEPENDENT_AMBULATORY_CARE_PROVIDER_SITE_OTHER): Payer: Medicare PPO | Admitting: Cardiology

## 2019-07-12 ENCOUNTER — Other Ambulatory Visit: Payer: Self-pay

## 2019-07-12 VITALS — BP 118/78 | HR 70 | Ht 74.0 in | Wt 234.2 lb

## 2019-07-12 DIAGNOSIS — I2510 Atherosclerotic heart disease of native coronary artery without angina pectoris: Secondary | ICD-10-CM

## 2019-07-12 DIAGNOSIS — I5022 Chronic systolic (congestive) heart failure: Secondary | ICD-10-CM

## 2019-07-12 DIAGNOSIS — I4891 Unspecified atrial fibrillation: Secondary | ICD-10-CM | POA: Diagnosis not present

## 2019-07-12 NOTE — Patient Instructions (Signed)

## 2019-07-12 NOTE — Progress Notes (Signed)
Clinical Summary [name] is a 69 y.o.male seen today for a focused visitfor his history of chronic systolic HF/ICM.  1. CAD/ICM/Chronic systolic HF - hx of CABG in 2001, at [facility].  - GXT 09/2012 with no ischemic changes, did have frequent PVCs, 3 beat run of NSVT - echo 01/2013 LVEF 50%, grade I diastoilc dysfunction.  - echo 07/2016 LVEF 40%, grade I diastolic dysfunction - 06/2016 nuclear stress primarily scar with mild peri-infarct ischemia. LVEF 40%, intermediate risk.    - cath 08/2016: mid LAD occluded, mid LCX 80%, RCA 100%. OM 1 80%. OM2 99%. SVG-RPDA occluded, LIMA-LAD atretic and occluded in midportion, LAD fills from SVG-diag. SVG-D2 patent, RIMA-OM1 occluded, SVG-OM2 occluded. RCA territory fills by collaterals Received angiolpasty to LCX, OM1 80% receivded DES. Recs for indefinite plavix.  03/2018 echo LVEF 25-30%, diffuse hypokinesis, grade II diastoilc dysfunction, mild MR    10/2018 echo LVEF 40-45% - last visit we started aldaconte 12.5mg  daily   - no recent SOb/DOE - no recent edema - no chest pain - compliant with meds.    2. Symptomatic bradycardia - s/p pacemaker placement 12/2017, St Jude BiV pacemaker for complete AV block. At time of implant LVEF was 40% - normal device check 01/2019   06/2019 normal device check.    3. Afib - new diagnosis during 12/2017 admission - s/p DCCV 02/04/18.Returned back to afib. Plan for rate control strategy  - denies any palpitations - no bleeding on xarelto  [id number] got married in 07/2018, small ceremony due to COVID-19. Had to cancel the family cruise after Past Medical History:  Diagnosis Date  . AF (atrial fibrillation) (HCC) 12/29/2017  . CAD in native artery    08/05/16 Cath, atresia of LIMA to LAD, occlusion of SVG-->RCA, SVG-->Circumflex/OM2, SVG-->OM1, patent SVG to diag, DES to native circumflex.   . Erectile dysfunction   . Hyperlipidemia, mixed   . Myocardial infarction  [facility]) 2001   Out of hospital myocardial infarction/inferior wall, status post Cardiolite study 2007.  Ejection fraction 45%  . Pacemaker 12/2017  . Unspecified essential hypertension      Allergies  Allergen Reactions  . Acetaminophen Other (See Comments)    Hallucinations     Current Outpatient Medications  Medication Sig Dispense Refill  . atorvastatin (LIPITOR) 80 MG tablet Take 1 tablet (80 mg total) by mouth daily. 90 tablet 3  . clonazePAM (KLONOPIN) 0.5 MG tablet Take 0.5 mg by mouth at bedtime as needed for anxiety.    Marland Kitchen ENTRESTO 97-103 MG TAKE 1 TABLET BY MOUTH TWICE A DAY 60 tablet 6  . metoprolol (TOPROL-XL) 200 MG 24 hr tablet TAKE 1 TABLET BY MOUTH EVERY DAY 90 tablet 1  . Multiple Vitamin (MULTIVITAMIN) tablet Take 1 tablet by mouth daily.     . nitroGLYCERIN (NITROSTAT) 0.4 MG SL tablet Place 0.4 mg under the tongue every 5 (five) minutes as needed for chest pain.     . pantoprazole (PROTONIX) 40 MG tablet Take 1 tablet (40 mg total) by mouth daily. 90 tablet 2  . rivaroxaban (XARELTO) 20 MG TABS tablet Take 1 tablet (20 mg total) by mouth daily. 90 tablet 3  . sildenafil (REVATIO) 20 MG tablet Take 20-100 mg by mouth as needed (30 minutes prior to sexual activity).   6  . spironolactone (ALDACTONE) 25 MG tablet Take 0.5 tablets (12.5 mg total) by mouth daily. 45 tablet 1   No current facility-administered medications for this visit.  Past Surgical History:  Procedure Laterality Date  . BIV PACEMAKER INSERTION CRT-P N/A 12/25/2017   Procedure: BIV PACEMAKER INSERTION CRT-P;  Surgeon: Marinus Maw, MD;  Location: [facility] INVASIVE CV LAB;  Service: Cardiovascular;  Laterality: N/A;  . CARDIOVERSION N/A 02/04/2018   Procedure: CARDIOVERSION;  Surgeon: Antoine Poche, MD;  [facility];  Service: Endoscopy;  Laterality: N/A;  . CATARACT EXTRACTION    . CORONARY ARTERY BYPASS GRAFT  2001   2001.  LIMA to LAD, SVG to OM1, SVG to OM 2, SVG to diagonal, SVG  to PDA.  Marland Kitchen CORONARY BALLOON ANGIOPLASTY N/A 08/05/2016   Procedure: Coronary Balloon Angioplasty;  Surgeon: Corky Crafts, MD;  Location: [facility] INVASIVE CV LAB;  Service: Cardiovascular;  Laterality: N/A;  CFX  . CORONARY STENT INTERVENTION N/A 08/05/2016   Procedure: Coronary Stent Intervention;  Surgeon: Corky Crafts, MD;  Location: [facility] INVASIVE CV LAB;  Service: Cardiovascular;  Laterality: N/A;  OM  . LAPAROSCOPIC CHOLECYSTECTOMY  06/22/2013   Dr. Cristy Folks  . LEFT HEART CATH AND CORS/GRAFTS ANGIOGRAPHY N/A 08/05/2016   Procedure: Left Heart Cath and Cors/Grafts Angiography;  Surgeon: Corky Crafts, MD;  Location: [facility] INVASIVE CV LAB;  Service: Cardiovascular;  Laterality: N/A;     Allergies  Allergen Reactions  . Acetaminophen Other (See Comments)    Hallucinations      Family History  Problem Relation Age of Onset  . Alzheimer's disease Mother   . Brain cancer Mother        brain tumor  . Stroke Father   . Alzheimer's disease Father      Social History Mr. Ordway reports that he has never smoked. He has never used smokeless tobacco. Mr. Kamaka reports current alcohol use.   Review of Systems CONSTITUTIONAL: No weight loss, fever, chills, weakness or fatigue.  HEENT: Eyes: No visual loss, blurred vision, double vision or yellow sclerae.No hearing loss, sneezing, congestion, runny nose or sore throat.  SKIN: No rash or itching.  CARDIOVASCULAR: per hpi RESPIRATORY: No shortness of breath, cough or sputum.  GASTROINTESTINAL: No anorexia, nausea, vomiting or diarrhea. No abdominal pain or blood.  GENITOURINARY: No burning on urination, no polyuria NEUROLOGICAL: No headache, dizziness, syncope, paralysis, ataxia, numbness or tingling in the extremities. No change in bowel or bladder control.  MUSCULOSKELETAL: No muscle, back pain, joint pain or stiffness.  LYMPHATICS: No enlarged nodes. No history of splenectomy.  PSYCHIATRIC: No history of depression or anxiety.    ENDOCRINOLOGIC: No reports of sweating, cold or heat intolerance. No polyuria or polydipsia.  Marland Kitchen   Physical Examination Today's Vitals   07/12/19 1400  BP: 118/78  Pulse: 70  SpO2: 96%  Weight: 234 lb 3.2 oz (106.2 kg)  Height: 6\' 2"  (1.88 m)   Body mass index is 30.07 kg/m.  Gen: resting comfortably, no acute distress HEENT: no scleral icterus, pupils equal round and reactive, no palptable cervical adenopathy,  CV: RRR, no mr/g, no jvd Resp: Clear to auscultation bilaterally GI: abdomen is soft, non-tender, non-distended, normal bowel sounds, no hepatosplenomegaly MSK: extremities are warm, no edema.  Skin: warm, no rash Neuro:  no focal deficits Psych: appropriate affect   Diagnostic Studies  01/2013 Echo Study Conclusions  - Left ventricle: Systolic function is low normal, with an estimated EF of 50-55%, but closer to 50%. There is mild global hypokinesis. The cavity size was normal. Wall thickness was increased in a pattern of mild LVH. There was an increased relative contribution of  atrial contraction to ventricular filling. Doppler parameters are consistent with abnormal left ventricular relaxation (grade 1 diastolic dysfunction). - Ventricular septum: Motion consistent with post-operative changes. The contour showed diastolic flattening. - Left atrium: The atrium was mildly dilated.   09/2012 ETT Exc 6 min 50 sec, 10.1 METs. No specific ischemic changes, frequent PVCs and PACs, 3 beat run of NSVT.   06/2016 Nuclear stress test  ECG nondiagnostic due to IVCD. No arrhythmias noted during exercise.  Blood pressure demonstrated a hypertensive response to exercise.  Medium, moderate intensity, inferior/inferolateral defect extended from apex to base. There is a partially reversible zone in the inferolateral wall at the base. Most suggestive of scar with mild peri-infarct ischemia.  This is an intermediate risk study.  Nuclear stress EF: 40%. Consider  confirmation with echocardiography.   07/2016 echo Study Conclusions  - Left ventricle: The cavity size was at the upper limits of normal. Wall thickness was increased in a pattern of mild LVH. The estimated ejection fraction was 40%. Diffuse hypokinesis. There is akinesis of the basalinferior myocardium. Doppler parameters are consistent with abnormal left ventricular relaxation (grade 1 diastolic dysfunction). - Aortic valve: Mildly calcified annulus. Trileaflet. - Mitral valve: Mildly thickened leaflets . There was mild regurgitation. - Left atrium: The atrium was mildly dilated. - Tricuspid valve: There was trivial regurgitation. - Pulmonary arteries: Systolic pressure could not be accurately estimated. - Pericardium, extracardiac: There was no pericardial effusion.  Impressions:  - Mild LVH with upper normal chamber size and LVEF approximately 40%. There is diffuse hypokinesis with inferior basal akinesis. Probable grade 1 diastolic dysfunction. Mild left atrial enlargement. Mildly thickened mitral leaflets with mild mitral regurgitation.   08/2016 cath  Severe native three-vessel coronary artery disease.  Mid RCA lesion, 100 %stenosed. SVG to PDA is occluded. There are right to right and left to right collaterals.  Mid LAD lesion, 100 %stenosed. LIMA to LAD is atretic. LAD fills from flow from the SVG to diagonal filling the LAD.  Ost 2nd Mrg to 2nd Mrg lesion, 99 %stenosed. SVG to OM2 is occluded.  Ost 1st Mrg to 1st Mrg lesion, 80 %stenosed. Free RIMA graft to OM 1 is occluded. A STENT PROMUS PREM MR 3.5X28 drug eluting stent was successfully placed., postdilated to > 6mm.  Post intervention, there is a 0% residual stenosis.  Mid Cx lesion, 80 %stenosed. THis was treated with initial cutting balloon angioplasty with a 2.5 mm balloon prior to stent placement in the OM. Then after stent placement, PTCA was done with a 2.0 balloon.  Post  intervention, there is a 10% residual stenosis.  The left ventricular ejection fraction is 25-35% by visual estimate.  LV end diastolic pressure is moderately elevated.  There is moderate to severe left ventricular systolic dysfunction.  There is no aortic valve stenosis.  Severe coronary artery disease. Severe graft disease. Only patent graft is the SVG to diagonal which feeds both the diagonal and the LAD. Successful stenting of a large OM1 and balloon angioplasty of the mid circumflex. The RCA territory fills by right to right and left to right collaterals. Continue aggressive secondary prevention. He'll need dual antiplatelet therapy for at least a year. Consider clopidogrel longer-term given the extent of his disease.   10/2018 echo IMPRESSIONS   1. The left ventricle has mild-moderately reduced systolic function, with an ejection fraction of 40-45%. The cavity size was normal. There is mildly increased left ventricular wall thickness. Left ventricular diastolic Doppler parameters are  indeterminate. 2.  Left atrial size was severely dilated. 3. Right atrial size was moderately dilated. 4. The aortic valve is tricuspid. Mild thickening of the aortic valve. Mild calcification of the aortic valve. No stenosis of the aortic valve. Mild aortic annular calcification noted. 5. The mitral valve is abnormal. Mild thickening of the mitral valve leaflet. Mild calcification of the mitral valve leaflet. There is mild mitral annular calcification present. No evidence of mitral valve stenosis. 6. The aortic root is normal in size and structure. 7. Pulmonary hypertension is indeterminant, inadequate TR jet. 8. The interatrial septum was not well visualized.   Assessment and Plan   1. CAD/Chronic systolic HF/ICM -no symptoms,continue current meds  2. Afib - doing well, we will continue current meds  3. Bradycardia -no symptoms, recent normal pacemaker check - continue to  monitor.       Arnoldo Lenis, M.D.,

## 2019-08-05 DIAGNOSIS — D485 Neoplasm of uncertain behavior of skin: Secondary | ICD-10-CM | POA: Diagnosis not present

## 2019-08-05 DIAGNOSIS — Z85828 Personal history of other malignant neoplasm of skin: Secondary | ICD-10-CM | POA: Diagnosis not present

## 2019-08-05 DIAGNOSIS — C44729 Squamous cell carcinoma of skin of left lower limb, including hip: Secondary | ICD-10-CM | POA: Diagnosis not present

## 2019-08-12 DIAGNOSIS — C44729 Squamous cell carcinoma of skin of left lower limb, including hip: Secondary | ICD-10-CM | POA: Diagnosis not present

## 2019-08-25 DIAGNOSIS — Z961 Presence of intraocular lens: Secondary | ICD-10-CM | POA: Diagnosis not present

## 2019-08-25 DIAGNOSIS — H26491 Other secondary cataract, right eye: Secondary | ICD-10-CM | POA: Diagnosis not present

## 2019-08-31 DIAGNOSIS — L570 Actinic keratosis: Secondary | ICD-10-CM | POA: Diagnosis not present

## 2019-08-31 DIAGNOSIS — D045 Carcinoma in situ of skin of trunk: Secondary | ICD-10-CM | POA: Diagnosis not present

## 2019-08-31 DIAGNOSIS — D485 Neoplasm of uncertain behavior of skin: Secondary | ICD-10-CM | POA: Diagnosis not present

## 2019-09-08 ENCOUNTER — Other Ambulatory Visit: Payer: Self-pay | Admitting: Cardiology

## 2019-09-09 DIAGNOSIS — C44529 Squamous cell carcinoma of skin of other part of trunk: Secondary | ICD-10-CM | POA: Diagnosis not present

## 2019-09-15 ENCOUNTER — Ambulatory Visit (INDEPENDENT_AMBULATORY_CARE_PROVIDER_SITE_OTHER): Payer: Medicare PPO | Admitting: *Deleted

## 2019-09-15 DIAGNOSIS — I442 Atrioventricular block, complete: Secondary | ICD-10-CM

## 2019-09-15 LAB — CUP PACEART REMOTE DEVICE CHECK
Battery Remaining Longevity: 91 mo
Battery Remaining Percentage: 95.5 %
Battery Voltage: 2.98 V
Brady Statistic AP VP Percent: 0 %
Brady Statistic AP VS Percent: 0 %
Brady Statistic AS VP Percent: 0 %
Brady Statistic AS VS Percent: 0 %
Brady Statistic RA Percent Paced: 1 %
Date Time Interrogation Session: 20210512024932
Implantable Lead Implant Date: 20190822
Implantable Lead Implant Date: 20190822
Implantable Lead Implant Date: 20190822
Implantable Lead Location: 753858
Implantable Lead Location: 753859
Implantable Lead Location: 753860
Implantable Pulse Generator Implant Date: 20190822
Lead Channel Impedance Value: 540 Ohm
Lead Channel Impedance Value: 550 Ohm
Lead Channel Impedance Value: 950 Ohm
Lead Channel Pacing Threshold Amplitude: 0.625 V
Lead Channel Pacing Threshold Amplitude: 1.5 V
Lead Channel Pacing Threshold Pulse Width: 0.5 ms
Lead Channel Pacing Threshold Pulse Width: 0.5 ms
Lead Channel Sensing Intrinsic Amplitude: 10.4 mV
Lead Channel Sensing Intrinsic Amplitude: 5 mV
Lead Channel Setting Pacing Amplitude: 2 V
Lead Channel Setting Pacing Amplitude: 2.5 V
Lead Channel Setting Pacing Amplitude: 3.5 V
Lead Channel Setting Pacing Pulse Width: 0.5 ms
Lead Channel Setting Pacing Pulse Width: 0.5 ms
Lead Channel Setting Sensing Sensitivity: 2 mV
Pulse Gen Model: 3562
Pulse Gen Serial Number: 9056721

## 2019-09-16 NOTE — Progress Notes (Signed)
Remote pacemaker transmission.   

## 2019-11-07 ENCOUNTER — Other Ambulatory Visit: Payer: Self-pay | Admitting: Cardiology

## 2019-12-15 ENCOUNTER — Ambulatory Visit (INDEPENDENT_AMBULATORY_CARE_PROVIDER_SITE_OTHER): Payer: Medicare PPO | Admitting: *Deleted

## 2019-12-15 DIAGNOSIS — I442 Atrioventricular block, complete: Secondary | ICD-10-CM | POA: Diagnosis not present

## 2019-12-15 LAB — CUP PACEART REMOTE DEVICE CHECK
Battery Remaining Longevity: 92 mo
Battery Remaining Percentage: 95.5 %
Battery Voltage: 2.98 V
Brady Statistic AP VP Percent: 0 %
Brady Statistic AP VS Percent: 0 %
Brady Statistic AS VP Percent: 0 %
Brady Statistic AS VS Percent: 0 %
Brady Statistic RA Percent Paced: 1 %
Date Time Interrogation Session: 20210811033304
Implantable Lead Implant Date: 20190822
Implantable Lead Implant Date: 20190822
Implantable Lead Implant Date: 20190822
Implantable Lead Location: 753858
Implantable Lead Location: 753859
Implantable Lead Location: 753860
Implantable Pulse Generator Implant Date: 20190822
Lead Channel Impedance Value: 1025 Ohm
Lead Channel Impedance Value: 550 Ohm
Lead Channel Impedance Value: 560 Ohm
Lead Channel Pacing Threshold Amplitude: 0.625 V
Lead Channel Pacing Threshold Amplitude: 1.5 V
Lead Channel Pacing Threshold Pulse Width: 0.5 ms
Lead Channel Pacing Threshold Pulse Width: 0.5 ms
Lead Channel Sensing Intrinsic Amplitude: 11 mV
Lead Channel Sensing Intrinsic Amplitude: 5 mV
Lead Channel Setting Pacing Amplitude: 2 V
Lead Channel Setting Pacing Amplitude: 2.5 V
Lead Channel Setting Pacing Amplitude: 3.5 V
Lead Channel Setting Pacing Pulse Width: 0.5 ms
Lead Channel Setting Pacing Pulse Width: 0.5 ms
Lead Channel Setting Sensing Sensitivity: 2 mV
Pulse Gen Model: 3562
Pulse Gen Serial Number: 9056721

## 2019-12-17 NOTE — Progress Notes (Signed)
Remote pacemaker transmission.   

## 2020-01-05 ENCOUNTER — Other Ambulatory Visit: Payer: Self-pay | Admitting: Cardiology

## 2020-01-18 ENCOUNTER — Ambulatory Visit: Payer: Medicare PPO | Admitting: Cardiology

## 2020-01-28 DIAGNOSIS — Z23 Encounter for immunization: Secondary | ICD-10-CM | POA: Diagnosis not present

## 2020-02-02 ENCOUNTER — Encounter: Payer: Self-pay | Admitting: Cardiology

## 2020-02-02 ENCOUNTER — Ambulatory Visit: Payer: Medicare PPO | Admitting: Cardiology

## 2020-02-02 VITALS — BP 140/90 | HR 84 | Ht 74.0 in | Wt 234.4 lb

## 2020-02-02 DIAGNOSIS — I2510 Atherosclerotic heart disease of native coronary artery without angina pectoris: Secondary | ICD-10-CM

## 2020-02-02 DIAGNOSIS — I5022 Chronic systolic (congestive) heart failure: Secondary | ICD-10-CM | POA: Diagnosis not present

## 2020-02-02 DIAGNOSIS — I4891 Unspecified atrial fibrillation: Secondary | ICD-10-CM | POA: Diagnosis not present

## 2020-02-02 DIAGNOSIS — I10 Essential (primary) hypertension: Secondary | ICD-10-CM

## 2020-02-02 DIAGNOSIS — E785 Hyperlipidemia, unspecified: Secondary | ICD-10-CM

## 2020-02-02 DIAGNOSIS — R7309 Other abnormal glucose: Secondary | ICD-10-CM | POA: Diagnosis not present

## 2020-02-02 DIAGNOSIS — R001 Bradycardia, unspecified: Secondary | ICD-10-CM

## 2020-02-02 NOTE — Progress Notes (Signed)
Clinical Summary [name] is a 69 y.o.male seen today for follow up of the following medical problems.    1. CAD/ICM/Chronic systolic HF - hx of CABG in 2001, at [facility].  - GXT 09/2012 with no ischemic changes, did have frequent PVCs, 3 beat run of NSVT - echo 01/2013 LVEF 50%, grade I diastoilc dysfunction.  - echo 07/2016 LVEF 40%, grade I diastolic dysfunction - 06/2016 nuclear stress primarily scar with mild peri-infarct ischemia. LVEF 40%, intermediate risk.    - cath 08/2016: mid LAD occluded, mid LCX 80%, RCA 100%. OM 1 80%. OM2 99%. SVG-RPDA occluded, LIMA-LAD atretic and occluded in midportion, LAD fills from SVG-diag. SVG-D2 patent, RIMA-OM1 occluded, SVG-OM2 occluded. RCA territory fills by collaterals Received angiolpasty to LCX, OM1 80% receivded DES. Recs for indefinite plavix.  03/2018 echo LVEF 25-30%, diffuse hypokinesis, grade II diastoilc dysfunction, mild MR  10/2018 echo LVEF 40-45%    - no recent SOB/DOE. No LE edema - no chest pains - compliant with meds.    2. Symptomatic bradycardia - s/p pacemaker placement 12/2017, St Jude BiV pacemaker for complete AV block. At time of implant LVEF was 40%   12/2019 normal device check.  - no recent symptoms   3. Afib - new diagnosis during 12/2017 admission - s/p DCCV 02/04/18.Returned back to afib. Plan for rate control strategy  - no recent palpitations - no bleeding on xarelto.    4. Hyperlipidemia -he is compliant with statin - has not had recent labs with pcp  5.  HTN -compliant with mteds   [id number]:daughter got married in 07/2018, small ceremony due to COVID-19. Had to cancel the family cruise after Has 2 grandkids  Has had moderna vaccine x 2 for covid   Past Medical History:  Diagnosis Date  . AF (atrial fibrillation) (HCC) 12/29/2017  . CAD in native artery    08/05/16 Cath, atresia of LIMA to LAD, occlusion of SVG-->RCA, SVG-->Circumflex/OM2, SVG-->OM1, patent SVG to  diag, DES to native circumflex.   . Erectile dysfunction   . Hyperlipidemia, mixed   . Myocardial infarction [facility](HCC) 2001   Out of hospital myocardial infarction/inferior wall, status post Cardiolite study 2007.  Ejection fraction 45%  . Pacemaker 12/2017  . Unspecified essential hypertension      Allergies  Allergen Reactions  . Acetaminophen Other (See Comments)    Hallucinations     Current Outpatient Medications  Medication Sig Dispense Refill  . spironolactone (ALDACTONE) 25 MG tablet TAKE 1/2 TABLET BY MOUTH EVERY DAY 45 tablet 1  . atorvastatin (LIPITOR) 80 MG tablet Take 1 tablet (80 mg total) by mouth daily. 90 tablet 3  . clonazePAM (KLONOPIN) 0.5 MG tablet Take 0.5 mg by mouth at bedtime as needed for anxiety.    Marland Kitchen. ENTRESTO 97-103 MG TAKE 1 TABLET BY MOUTH TWICE A DAY 60 tablet 6  . metoprolol (TOPROL-XL) 200 MG 24 hr tablet TAKE 1 TABLET BY MOUTH EVERY DAY 90 tablet 1  . Multiple Vitamin (MULTIVITAMIN) tablet Take 1 tablet by mouth daily.     . nitroGLYCERIN (NITROSTAT) 0.4 MG SL tablet Place 0.4 mg under the tongue every 5 (five) minutes as needed for chest pain.     . pantoprazole (PROTONIX) 40 MG tablet TAKE 1 TABLET BY MOUTH EVERY DAY 90 tablet 2  . sildenafil (REVATIO) 20 MG tablet Take 20-100 mg by mouth as needed (30 minutes prior to sexual activity).   6  . XARELTO 20 MG TABS tablet  TAKE 1 TABLET BY MOUTH EVERY DAY 90 tablet 3   No current facility-administered medications for this visit.     Past Surgical History:  Procedure Laterality Date  . BIV PACEMAKER INSERTION CRT-P N/A 12/25/2017   Procedure: BIV PACEMAKER INSERTION CRT-P;  Surgeon: Michael Maw, MD;  Location: [facility] INVASIVE CV LAB;  Service: Cardiovascular;  Laterality: N/A;  . CARDIOVERSION N/A 02/04/2018   Procedure: CARDIOVERSION;  Surgeon: Michael Poche, MD;  Location: [facility];  Service: Endoscopy;  Laterality: N/A;  . CATARACT EXTRACTION    . CORONARY ARTERY BYPASS GRAFT  2001    2001.  LIMA to LAD, SVG to OM1, SVG to OM 2, SVG to diagonal, SVG to PDA.  Marland Kitchen CORONARY BALLOON ANGIOPLASTY N/A 08/05/2016   Procedure: Coronary Balloon Angioplasty;  Surgeon: Michael Crafts, MD;  Location: [facility] INVASIVE CV LAB;  Service: Cardiovascular;  Laterality: N/A;  CFX  . CORONARY STENT INTERVENTION N/A 08/05/2016   Procedure: Coronary Stent Intervention;  Surgeon: Michael Crafts, MD;  Location: [facility] INVASIVE CV LAB;  Service: Cardiovascular;  Laterality: N/A;  OM  . LAPAROSCOPIC CHOLECYSTECTOMY  06/22/2013   Dr. Cristy Melton  . LEFT HEART CATH AND CORS/GRAFTS ANGIOGRAPHY N/A 08/05/2016   Procedure: Left Heart Cath and Cors/Grafts Angiography;  Surgeon: Michael Crafts, MD;  Location: [facility] INVASIVE CV LAB;  Service: Cardiovascular;  Laterality: N/A;     Allergies  Allergen Reactions  . Acetaminophen Other (See Comments)    Hallucinations      Family History  Problem Relation Age of Onset  . Alzheimer's disease Mother   . Brain cancer Mother        brain tumor  . Stroke Father   . Alzheimer's disease Father      Social History [name] reports that he has never smoked. He has never used smokeless tobacco. [name] reports current alcohol use.   Review of Systems CONSTITUTIONAL: No weight loss, fever, chills, weakness or fatigue.  HEENT: Eyes: No visual loss, blurred vision, double vision or yellow sclerae.No hearing loss, sneezing, congestion, runny nose or sore throat.  SKIN: No rash or itching.  CARDIOVASCULAR: per hpi RESPIRATORY: No shortness of breath, cough or sputum.  GASTROINTESTINAL: No anorexia, nausea, vomiting or diarrhea. No abdominal pain or blood.  GENITOURINARY: No burning on urination, no polyuria NEUROLOGICAL: No headache, dizziness, syncope, paralysis, ataxia, numbness or tingling in the extremities. No change in bowel or bladder control.  MUSCULOSKELETAL: No muscle, back pain, joint pain or stiffness.  LYMPHATICS: No enlarged nodes. No history of  splenectomy.  PSYCHIATRIC: No history of depression or anxiety.  ENDOCRINOLOGIC: No reports of sweating, cold or heat intolerance. No polyuria or polydipsia.  Marland Kitchen   Physical Examination Today's Vitals   02/02/20 0816  BP: 140/90  Pulse: 84  SpO2: 98%  Weight: 234 lb 6.4 oz (106.3 kg)  Height: 6\' 2"  (1.88 m)   Body mass index is 30.1 kg/m.  Gen: resting comfortably, no acute distress HEENT: no scleral icterus, pupils equal round and reactive, no palptable cervical adenopathy,  CV: RRR, no m/r/g, no jvd Resp: Clear to auscultation bilaterally GI: abdomen is soft, non-tender, non-distended, normal bowel sounds, no hepatosplenomegaly MSK: extremities are warm, no edema.  Skin: warm, no rash Neuro:  no focal deficits Psych: appropriate affect   Diagnostic Studies 01/2013 Echo Study Conclusions  - Left ventricle: Systolic function is low normal, with an estimated EF of 50-55%, but closer to 50%. There is mild global hypokinesis. The  cavity size was normal. Wall thickness was increased in a pattern of mild LVH. There was an increased relative contribution of atrial contraction to ventricular filling. Doppler parameters are consistent with abnormal left ventricular relaxation (grade 1 diastolic dysfunction). - Ventricular septum: Motion consistent with post-operative changes. The contour showed diastolic flattening. - Left atrium: The atrium was mildly dilated.   09/2012 ETT Exc 6 min 50 sec, 10.1 METs. No specific ischemic changes, frequent PVCs and PACs, 3 beat run of NSVT.   06/2016 Nuclear stress test  ECG nondiagnostic due to IVCD. No arrhythmias noted during exercise.  Blood pressure demonstrated a hypertensive response to exercise.  Medium, moderate intensity, inferior/inferolateral defect extended from apex to base. There is a partially reversible zone in the inferolateral wall at the base. Most suggestive of scar with mild peri-infarct ischemia.  This is an  intermediate risk study.  Nuclear stress EF: 40%. Consider confirmation with echocardiography.   07/2016 echo Study Conclusions  - Left ventricle: The cavity size was at the upper limits of normal. Wall thickness was increased in a pattern of mild LVH. The estimated ejection fraction was 40%. Diffuse hypokinesis. There is akinesis of the basalinferior myocardium. Doppler parameters are consistent with abnormal left ventricular relaxation (grade 1 diastolic dysfunction). - Aortic valve: Mildly calcified annulus. Trileaflet. - Mitral valve: Mildly thickened leaflets . There was mild regurgitation. - Left atrium: The atrium was mildly dilated. - Tricuspid valve: There was trivial regurgitation. - Pulmonary arteries: Systolic pressure could not be accurately estimated. - Pericardium, extracardiac: There was no pericardial effusion.  Impressions:  - Mild LVH with upper normal chamber size and LVEF approximately 40%. There is diffuse hypokinesis with inferior basal akinesis. Probable grade 1 diastolic dysfunction. Mild left atrial enlargement. Mildly thickened mitral leaflets with mild mitral regurgitation.   08/2016 cath  Severe native three-vessel coronary artery disease.  Mid RCA lesion, 100 %stenosed. SVG to PDA is occluded. There are right to right and left to right collaterals.  Mid LAD lesion, 100 %stenosed. LIMA to LAD is atretic. LAD fills from flow from the SVG to diagonal filling the LAD.  Ost 2nd Mrg to 2nd Mrg lesion, 99 %stenosed. SVG to OM2 is occluded.  Ost 1st Mrg to 1st Mrg lesion, 80 %stenosed. Free RIMA graft to OM 1 is occluded. A STENT PROMUS PREM MR 3.5X28 drug eluting stent was successfully placed., postdilated to > 44mm.  Post intervention, there is a 0% residual stenosis.  Mid Cx lesion, 80 %stenosed. THis was treated with initial cutting balloon angioplasty with a 2.5 mm balloon prior to stent placement in the OM. Then after  stent placement, PTCA was done with a 2.0 balloon.  Post intervention, there is a 10% residual stenosis.  The left ventricular ejection fraction is 25-35% by visual estimate.  LV end diastolic pressure is moderately elevated.  There is moderate to severe left ventricular systolic dysfunction.  There is no aortic valve stenosis.  Severe coronary artery disease. Severe graft disease. Only patent graft is the SVG to diagonal which feeds both the diagonal and the LAD. Successful stenting of a large OM1 and balloon angioplasty of the mid circumflex. The RCA territory fills by right to right and left to right collaterals. Continue aggressive secondary prevention. He'll need dual antiplatelet therapy for at least a year. Consider clopidogrel longer-term given the extent of his disease.   10/2018 echo IMPRESSIONS   1. The left ventricle has mild-moderately reduced systolic function, with an ejection fraction of 40-45%. The  cavity size was normal. There is mildly increased left ventricular wall thickness. Left ventricular diastolic Doppler parameters are  indeterminate. 2. Left atrial size was severely dilated. 3. Right atrial size was moderately dilated. 4. The aortic valve is tricuspid. Mild thickening of the aortic valve. Mild calcification of the aortic valve. No stenosis of the aortic valve. Mild aortic annular calcification noted. 5. The mitral valve is abnormal. Mild thickening of the mitral valve leaflet. Mild calcification of the mitral valve leaflet. There is mild mitral annular calcification present. No evidence of mitral valve stenosis. 6. The aortic root is normal in size and structure. 7. Pulmonary hypertension is indeterminant, inadequate TR jet. 8. The interatrial septum was not well visualized.    Assessment and Plan  1. CAD/Chronic systolic HF/ICM - no recent symptoms, continue current meds  2. Afib -no symptoms, continue current meds  3.  Bradycardia -normal device check last month, no symptoms - continue to monitor pacemaker  4.  HTN - elevated in clniic, just took meds prior to coming this AM - come back next week for nursing visit bp check      Michael Melton, M.D.

## 2020-02-02 NOTE — Patient Instructions (Addendum)
Medication Instructions:  Continue all current medications.  Labwork:  BMET, CBC, TSH, HgA1C, Lipid, Magnesium - orders given today.   Office will contact with results via phone or letter.    Testing/Procedures: none  Follow-Up: 6 months   Any Other Special Instructions Will Be Listed Below (If Applicable). Nurse visit in the next 2 weeks for blood pressure check.   If you need a refill on your cardiac medications before your next appointment, please call your pharmacy.

## 2020-02-14 ENCOUNTER — Other Ambulatory Visit: Payer: Self-pay | Admitting: *Deleted

## 2020-02-14 ENCOUNTER — Other Ambulatory Visit: Payer: Self-pay | Admitting: Cardiology

## 2020-02-14 MED ORDER — METOPROLOL SUCCINATE ER 200 MG PO TB24
200.0000 mg | ORAL_TABLET | Freq: Every day | ORAL | 1 refills | Status: DC
Start: 2020-02-14 — End: 2020-06-02

## 2020-02-16 ENCOUNTER — Ambulatory Visit: Payer: Medicare PPO | Admitting: *Deleted

## 2020-02-16 DIAGNOSIS — I10 Essential (primary) hypertension: Secondary | ICD-10-CM | POA: Diagnosis not present

## 2020-02-16 DIAGNOSIS — I2510 Atherosclerotic heart disease of native coronary artery without angina pectoris: Secondary | ICD-10-CM | POA: Diagnosis not present

## 2020-02-16 DIAGNOSIS — E782 Mixed hyperlipidemia: Secondary | ICD-10-CM | POA: Diagnosis not present

## 2020-02-16 NOTE — Progress Notes (Signed)
Pt here for repeat BP check - had labs done today at Dr Hinda Kehr office (will request) denies any complaints at this time BP by manual check 122/60 HR 69

## 2020-02-22 NOTE — Progress Notes (Signed)
Numbers look good, no changes   J Soma Lizak MD

## 2020-02-24 NOTE — Progress Notes (Signed)
Pt aware.

## 2020-02-28 DIAGNOSIS — D485 Neoplasm of uncertain behavior of skin: Secondary | ICD-10-CM | POA: Diagnosis not present

## 2020-02-28 DIAGNOSIS — L570 Actinic keratosis: Secondary | ICD-10-CM | POA: Diagnosis not present

## 2020-02-28 DIAGNOSIS — D0462 Carcinoma in situ of skin of left upper limb, including shoulder: Secondary | ICD-10-CM | POA: Diagnosis not present

## 2020-02-28 DIAGNOSIS — D044 Carcinoma in situ of skin of scalp and neck: Secondary | ICD-10-CM | POA: Diagnosis not present

## 2020-02-28 DIAGNOSIS — C4442 Squamous cell carcinoma of skin of scalp and neck: Secondary | ICD-10-CM | POA: Diagnosis not present

## 2020-02-28 DIAGNOSIS — C44629 Squamous cell carcinoma of skin of left upper limb, including shoulder: Secondary | ICD-10-CM | POA: Diagnosis not present

## 2020-03-02 DIAGNOSIS — C44629 Squamous cell carcinoma of skin of left upper limb, including shoulder: Secondary | ICD-10-CM | POA: Diagnosis not present

## 2020-03-02 DIAGNOSIS — C4442 Squamous cell carcinoma of skin of scalp and neck: Secondary | ICD-10-CM | POA: Diagnosis not present

## 2020-03-15 ENCOUNTER — Ambulatory Visit (INDEPENDENT_AMBULATORY_CARE_PROVIDER_SITE_OTHER): Payer: Medicare PPO

## 2020-03-15 DIAGNOSIS — I442 Atrioventricular block, complete: Secondary | ICD-10-CM

## 2020-03-16 LAB — CUP PACEART REMOTE DEVICE CHECK
Battery Remaining Longevity: 91 mo
Battery Remaining Percentage: 95.5 %
Battery Voltage: 2.98 V
Brady Statistic AP VP Percent: 0 %
Brady Statistic AP VS Percent: 0 %
Brady Statistic AS VP Percent: 0 %
Brady Statistic AS VS Percent: 0 %
Brady Statistic RA Percent Paced: 1 %
Date Time Interrogation Session: 20211110171652
Implantable Lead Implant Date: 20190822
Implantable Lead Implant Date: 20190822
Implantable Lead Implant Date: 20190822
Implantable Lead Location: 753858
Implantable Lead Location: 753859
Implantable Lead Location: 753860
Implantable Pulse Generator Implant Date: 20190822
Lead Channel Impedance Value: 540 Ohm
Lead Channel Impedance Value: 560 Ohm
Lead Channel Impedance Value: 990 Ohm
Lead Channel Pacing Threshold Amplitude: 0.625 V
Lead Channel Pacing Threshold Amplitude: 1.5 V
Lead Channel Pacing Threshold Pulse Width: 0.5 ms
Lead Channel Pacing Threshold Pulse Width: 0.5 ms
Lead Channel Sensing Intrinsic Amplitude: 12 mV
Lead Channel Sensing Intrinsic Amplitude: 5 mV
Lead Channel Setting Pacing Amplitude: 2 V
Lead Channel Setting Pacing Amplitude: 2.5 V
Lead Channel Setting Pacing Amplitude: 3.5 V
Lead Channel Setting Pacing Pulse Width: 0.5 ms
Lead Channel Setting Pacing Pulse Width: 0.5 ms
Lead Channel Setting Sensing Sensitivity: 2 mV
Pulse Gen Model: 3562
Pulse Gen Serial Number: 9056721

## 2020-03-17 NOTE — Progress Notes (Signed)
Remote pacemaker transmission.   

## 2020-03-24 ENCOUNTER — Encounter: Payer: Self-pay | Admitting: Internal Medicine

## 2020-03-24 ENCOUNTER — Ambulatory Visit: Payer: Medicare PPO | Admitting: Internal Medicine

## 2020-03-24 ENCOUNTER — Other Ambulatory Visit: Payer: Self-pay

## 2020-03-24 VITALS — BP 148/84 | HR 69 | Ht 74.0 in | Wt 233.0 lb

## 2020-03-24 DIAGNOSIS — I5022 Chronic systolic (congestive) heart failure: Secondary | ICD-10-CM

## 2020-03-24 DIAGNOSIS — I2510 Atherosclerotic heart disease of native coronary artery without angina pectoris: Secondary | ICD-10-CM

## 2020-03-24 DIAGNOSIS — I442 Atrioventricular block, complete: Secondary | ICD-10-CM | POA: Diagnosis not present

## 2020-03-24 DIAGNOSIS — I10 Essential (primary) hypertension: Secondary | ICD-10-CM | POA: Diagnosis not present

## 2020-03-24 LAB — CUP PACEART INCLINIC DEVICE CHECK
Battery Remaining Longevity: 104 mo
Battery Voltage: 2.98 V
Brady Statistic RA Percent Paced: 0 %
Brady Statistic RV Percent Paced: 98 %
Date Time Interrogation Session: 20211119085114
Implantable Lead Implant Date: 20190822
Implantable Lead Implant Date: 20190822
Implantable Lead Implant Date: 20190822
Implantable Lead Location: 753858
Implantable Lead Location: 753859
Implantable Lead Location: 753860
Implantable Pulse Generator Implant Date: 20190822
Lead Channel Impedance Value: 1012.5 Ohm
Lead Channel Impedance Value: 550 Ohm
Lead Channel Impedance Value: 600 Ohm
Lead Channel Pacing Threshold Amplitude: 0.625 V
Lead Channel Pacing Threshold Amplitude: 1.25 V
Lead Channel Pacing Threshold Pulse Width: 0.5 ms
Lead Channel Pacing Threshold Pulse Width: 0.5 ms
Lead Channel Sensing Intrinsic Amplitude: 2.3 mV
Lead Channel Setting Pacing Amplitude: 2 V
Lead Channel Setting Pacing Amplitude: 2.5 V
Lead Channel Setting Pacing Amplitude: 3.5 V
Lead Channel Setting Pacing Pulse Width: 0.5 ms
Lead Channel Setting Pacing Pulse Width: 0.5 ms
Lead Channel Setting Sensing Sensitivity: 2 mV
Pulse Gen Model: 3562
Pulse Gen Serial Number: 9056721

## 2020-03-24 NOTE — Patient Instructions (Signed)
Medication Instructions:  Your physician recommends that you continue on your current medications as directed. Please refer to the Current Medication list given to you today.  *If you need a refill on your cardiac medications before your next appointment, please call your pharmacy*   Lab Work: NONE   If you have labs (blood work) drawn today and your tests are completely normal, you will receive your results only by: . MyChart Message (if you have MyChart) OR . A paper copy in the mail If you have any lab test that is abnormal or we need to change your treatment, we will call you to review the results.   Testing/Procedures: NONE    Follow-Up: At [facility], you and your health needs are our priority.  As part of our continuing mission to provide you with exceptional heart care, we have created designated Provider Care Teams.  These Care Teams include your primary Cardiologist (physician) and Advanced Practice Providers (APPs -  Physician Assistants and Nurse Practitioners) who all work together to provide you with the care you need, when you need it.  We recommend signing up for the patient portal called "MyChart".  Sign up information is provided on this After Visit Summary.  MyChart is used to connect with patients for Virtual Visits (Telemedicine).  Patients are able to view lab/test results, encounter notes, upcoming appointments, etc.  Non-urgent messages can be sent to your provider as well.   To learn more about what you can do with MyChart, go to [email or web address].    Your next appointment:   1 year(s)  The format for your next appointment:   In Person  Provider:   Gregg Taylor, MD   Other Instructions Thank you for choosing [facility]!    

## 2020-03-24 NOTE — Progress Notes (Signed)
HPI [name] returns today for followup. He is a pleasant 69 yo man with a h/o chronic systolic heart failure and persistent atrial fib, s/p DCCV. He has a remote h/o MI, s/p CABG. He has HTN. In the interim, he notes no palpitations, chest pain or sob or edema. No syncope. Allergies  Allergen Reactions  . Acetaminophen Other (See Comments)    Hallucinations     Current Outpatient Medications  Medication Sig Dispense Refill  . atorvastatin (LIPITOR) 80 MG tablet Take 1 tablet (80 mg total) by mouth daily. 90 tablet 3  . clonazePAM (KLONOPIN) 0.5 MG tablet Take 0.5 mg by mouth at bedtime as needed for anxiety.    Marland Kitchen ENTRESTO 97-103 MG TAKE 1 TABLET BY MOUTH TWICE A DAY 60 tablet 6  . metoprolol (TOPROL-XL) 200 MG 24 hr tablet Take 1 tablet (200 mg total) by mouth daily. 90 tablet 1  . Multiple Vitamin (MULTIVITAMIN) tablet Take 1 tablet by mouth daily.     . nitroGLYCERIN (NITROSTAT) 0.4 MG SL tablet Place 0.4 mg under the tongue every 5 (five) minutes as needed for chest pain.     . pantoprazole (PROTONIX) 40 MG tablet TAKE 1 TABLET BY MOUTH EVERY DAY 90 tablet 2  . sildenafil (REVATIO) 20 MG tablet Take 20-100 mg by mouth as needed (30 minutes prior to sexual activity).   6  . spironolactone (ALDACTONE) 25 MG tablet TAKE 1/2 TABLET BY MOUTH EVERY DAY 45 tablet 1  . XARELTO 20 MG TABS tablet TAKE 1 TABLET BY MOUTH EVERY DAY 90 tablet 3   No current facility-administered medications for this visit.     Past Medical History:  Diagnosis Date  . AF (atrial fibrillation) (HCC) 12/29/2017  . CAD in native artery    08/05/16 Cath, atresia of LIMA to LAD, occlusion of SVG-->RCA, SVG-->Circumflex/OM2, SVG-->OM1, patent SVG to diag, DES to native circumflex.   . Erectile dysfunction   . Hyperlipidemia, mixed   . Myocardial infarction [facility]) 2001   Out of hospital myocardial infarction/inferior wall, status post Cardiolite study 2007.  Ejection fraction 45%  . Pacemaker 12/2017  .  Unspecified essential hypertension     ROS:   All systems reviewed and negative except as noted in the HPI.   Past Surgical History:  Procedure Laterality Date  . BIV PACEMAKER INSERTION CRT-P N/A 12/25/2017   Procedure: BIV PACEMAKER INSERTION CRT-P;  Surgeon: Marinus Maw, MD;  Location: [facility] INVASIVE CV LAB;  Service: Cardiovascular;  Laterality: N/A;  . CARDIOVERSION N/A 02/04/2018   Procedure: CARDIOVERSION;  Surgeon: Antoine Poche, MD;  [facility];  Service: Endoscopy;  Laterality: N/A;  . CATARACT EXTRACTION    . CORONARY ARTERY BYPASS GRAFT  2001   2001.  LIMA to LAD, SVG to OM1, SVG to OM 2, SVG to diagonal, SVG to PDA.  Marland Kitchen CORONARY BALLOON ANGIOPLASTY N/A 08/05/2016   Procedure: Coronary Balloon Angioplasty;  Surgeon: Corky Crafts, MD;  Location: [facility] INVASIVE CV LAB;  Service: Cardiovascular;  Laterality: N/A;  CFX  . CORONARY STENT INTERVENTION N/A 08/05/2016   Procedure: Coronary Stent Intervention;  Surgeon: Corky Crafts, MD;  Location: [facility] INVASIVE CV LAB;  Service: Cardiovascular;  Laterality: N/A;  OM  . LAPAROSCOPIC CHOLECYSTECTOMY  06/22/2013   Dr. Cristy Folks  . LEFT HEART CATH AND CORS/GRAFTS ANGIOGRAPHY N/A 08/05/2016   Procedure: Left Heart Cath and Cors/Grafts Angiography;  Surgeon: Corky Crafts, MD;  Location: [facility] INVASIVE CV LAB;  Service: Cardiovascular;  Laterality: N/A;     Family History  Problem Relation Age of Onset  . Alzheimer's disease Mother   . Brain cancer Mother        brain tumor  . Stroke Father   . Alzheimer's disease Father      Social History   Socioeconomic History  . Marital status: Married    Spouse name: Not on file  . Number of children: Not on file  . Years of education: Not on file  . Highest education level: Not on file  Occupational History  . Occupation: Full time    Employer: [employer]  Tobacco Use  . Smoking status: Never Smoker  . Smokeless tobacco: Never Used  Vaping Use  . Vaping  Use: Never used  Substance and Sexual Activity  . Alcohol use: Yes    Alcohol/week: 0.0 standard drinks    Comment: RARE  . Drug use: No  . Sexual activity: Not on file  Other Topics Concern  . Not on file  Social History Narrative  . Not on file   Social Determinants of Health   Financial Resource Strain:   . Difficulty of Paying Living Expenses: Not on file  Food Insecurity:   . Worried About Programme researcher, broadcasting/film/video in the Last Year: Not on file  . Ran Out of Food in the Last Year: Not on file  Transportation Needs:   . Lack of Transportation (Medical): Not on file  . Lack of Transportation (Non-Medical): Not on file  Physical Activity:   . Days of Exercise per Week: Not on file  . Minutes of Exercise per Session: Not on file  Stress:   . Feeling of Stress : Not on file  Social Connections:   . Frequency of Communication with Friends and Family: Not on file  . Frequency of Social Gatherings with Friends and Family: Not on file  . Attends Religious Services: Not on file  . Active Member of Clubs or Organizations: Not on file  . Attends Banker Meetings: Not on file  . Marital Status: Not on file  Intimate Partner Violence:   . Fear of Current or Ex-Partner: Not on file  . Emotionally Abused: Not on file  . Physically Abused: Not on file  . Sexually Abused: Not on file     BP (!) 148/84   Pulse 69   Ht 6\' 2"  (1.88 m)   Wt 233 lb (105.7 kg)   SpO2 98%   BMI 29.92 kg/m   Physical Exam:  Well appearing NAD HEENT: Unremarkable Neck:  No JVD, no thyromegally Lymphatics:  No adenopathy Back:  No CVA tenderness Lungs:  Clear with no wheezes HEART:  Regular rate rhythm, no murmurs, no rubs, no clicks Abd:  soft, positive bowel sounds, no organomegally, no rebound, no guarding Ext:  2 plus pulses, no edema, no cyanosis, no clubbing Skin:  No rashes no nodules Neuro:  CN II through XII intact, motor grossly intact  DEVICE  Normal device function.  See  PaceArt for details.   Assess/Plan: 1. Atrial fib - he is out of rhythm now 100% of the time. He is asymptomatic. 2. CHB - he is pacing 99% and has no escape today. He is asymptomatic. 3. CAD - he remains active but denies anginal symptoms. No change in meds. 4. coags - he has not had any bleeding on Xarelto.  5. PPM -his St. Jude biv PPM is working normally with over 6  years of battery longevity.  Sharlot Gowda Michael Wenk,MD

## 2020-05-11 DIAGNOSIS — R059 Cough, unspecified: Secondary | ICD-10-CM | POA: Diagnosis not present

## 2020-05-11 DIAGNOSIS — Z20828 Contact with and (suspected) exposure to other viral communicable diseases: Secondary | ICD-10-CM | POA: Diagnosis not present

## 2020-05-11 DIAGNOSIS — R5383 Other fatigue: Secondary | ICD-10-CM | POA: Diagnosis not present

## 2020-06-01 ENCOUNTER — Other Ambulatory Visit: Payer: Self-pay | Admitting: Cardiology

## 2020-06-02 ENCOUNTER — Other Ambulatory Visit: Payer: Self-pay | Admitting: Cardiology

## 2020-06-14 ENCOUNTER — Ambulatory Visit (INDEPENDENT_AMBULATORY_CARE_PROVIDER_SITE_OTHER): Payer: Medicare PPO

## 2020-06-14 DIAGNOSIS — I442 Atrioventricular block, complete: Secondary | ICD-10-CM

## 2020-06-15 LAB — CUP PACEART REMOTE DEVICE CHECK
Battery Remaining Longevity: 92 mo
Battery Remaining Percentage: 95.5 %
Battery Voltage: 2.98 V
Brady Statistic AP VP Percent: 0 %
Brady Statistic AP VS Percent: 0 %
Brady Statistic AS VP Percent: 0 %
Brady Statistic AS VS Percent: 0 %
Brady Statistic RA Percent Paced: 1 %
Date Time Interrogation Session: 20220209020011
Implantable Lead Implant Date: 20190822
Implantable Lead Implant Date: 20190822
Implantable Lead Implant Date: 20190822
Implantable Lead Location: 753858
Implantable Lead Location: 753859
Implantable Lead Location: 753860
Implantable Pulse Generator Implant Date: 20190822
Lead Channel Impedance Value: 1025 Ohm
Lead Channel Impedance Value: 540 Ohm
Lead Channel Impedance Value: 560 Ohm
Lead Channel Pacing Threshold Amplitude: 0.625 V
Lead Channel Pacing Threshold Amplitude: 1.25 V
Lead Channel Pacing Threshold Pulse Width: 0.5 ms
Lead Channel Pacing Threshold Pulse Width: 0.5 ms
Lead Channel Sensing Intrinsic Amplitude: 2.3 mV
Lead Channel Sensing Intrinsic Amplitude: 7.4 mV
Lead Channel Setting Pacing Amplitude: 2 V
Lead Channel Setting Pacing Amplitude: 2.5 V
Lead Channel Setting Pacing Amplitude: 3.5 V
Lead Channel Setting Pacing Pulse Width: 0.5 ms
Lead Channel Setting Pacing Pulse Width: 0.5 ms
Lead Channel Setting Sensing Sensitivity: 2 mV
Pulse Gen Model: 3562
Pulse Gen Serial Number: 9056721

## 2020-06-19 NOTE — Progress Notes (Signed)
Remote pacemaker transmission.   

## 2020-08-01 ENCOUNTER — Encounter: Payer: Self-pay | Admitting: Cardiology

## 2020-08-01 ENCOUNTER — Ambulatory Visit: Payer: Medicare PPO | Admitting: Cardiology

## 2020-08-01 VITALS — BP 120/76 | HR 70 | Ht 74.0 in | Wt 234.4 lb

## 2020-08-01 DIAGNOSIS — I4891 Unspecified atrial fibrillation: Secondary | ICD-10-CM

## 2020-08-01 DIAGNOSIS — I2510 Atherosclerotic heart disease of native coronary artery without angina pectoris: Secondary | ICD-10-CM

## 2020-08-01 DIAGNOSIS — I5022 Chronic systolic (congestive) heart failure: Secondary | ICD-10-CM | POA: Diagnosis not present

## 2020-08-01 NOTE — Patient Instructions (Signed)

## 2020-08-01 NOTE — Progress Notes (Signed)
Clinical Summary Mr. Rudin is a 70 y.o.male  seen today for follow up of the following medical problems.    1. CAD/ICM/Chronic systolic HF - hx of CABG in 2001, at [facility].  - GXT 09/2012 with no ischemic changes, did have frequent PVCs, 3 beat run of NSVT - echo 01/2013 LVEF 50%, grade I diastoilc dysfunction.  - echo 07/2016 LVEF 40%, grade I diastolic dysfunction - 06/2016 nuclear stress primarily scar with mild peri-infarct ischemia. LVEF 40%, intermediate risk.    - cath 08/2016: mid LAD occluded, mid LCX 80%, RCA 100%. OM 1 80%. OM2 99%. SVG-RPDA occluded, LIMA-LAD atretic and occluded in midportion, LAD fills from SVG-diag. SVG-D2 patent, RIMA-OM1 occluded, SVG-OM2 occluded. RCA territory fills by collaterals Received angiolpasty to LCX, OM1 80% receivded DES. Recs for indefinite plavix.  03/2018 echo LVEF 25-30%, diffuse hypokinesis, grade II diastoilc dysfunction, mild MR  10/2018 echo LVEF 40-45%    - no SOB/DOE, no chest pains. No recent edema - compliant with meds   2. Symptomatic bradycardia - s/p pacemaker placement 12/2017, St Jude BiV pacemaker for complete AV block. At time of implant LVEF was 40%   06/2020 normal device check - no recent symptoms   3. Afib - new diagnosis during 12/2017 admission - s/p DCCV 02/04/18.Returned back to afib. Plan for rate control strategy  -denies any palpitations - no bleeding on xarelto.    4. Hyperlipidemia  02/2020 TC 123 TG 92 HDL 37 LDL 68 - compliant with statin   5.  HTN -he is compliant with emds  [id number] got married in 07/2018, small ceremony due to COVID-19. Had to cancel the family cruise after Has 2 grandkids  Has had moderna vaccine x 2 for covid   Past Medical History:  Diagnosis Date  . AF (atrial fibrillation) (HCC) 12/29/2017  . CAD in native artery    08/05/16 Cath, atresia of LIMA to LAD, occlusion of SVG-->RCA, SVG-->Circumflex/OM2, SVG-->OM1, patent SVG to  diag, DES to native circumflex.   . Erectile dysfunction   . Hyperlipidemia, mixed   . Myocardial infarction [facility]) 2001   Out of hospital myocardial infarction/inferior wall, status post Cardiolite study 2007.  Ejection fraction 45%  . Pacemaker 12/2017  . Unspecified essential hypertension      Allergies  Allergen Reactions  . Acetaminophen Other (See Comments)    Hallucinations     Current Outpatient Medications  Medication Sig Dispense Refill  . atorvastatin (LIPITOR) 80 MG tablet TAKE 1 TABLET BY MOUTH EVERY DAY 90 tablet 3  . clonazePAM (KLONOPIN) 0.5 MG tablet Take 0.5 mg by mouth at bedtime as needed for anxiety.    Marland Kitchen ENTRESTO 97-103 MG TAKE 1 TABLET BY MOUTH TWICE A DAY 60 tablet 6  . metoprolol (TOPROL-XL) 200 MG 24 hr tablet TAKE 1 TABLET BY MOUTH EVERY DAY 90 tablet 1  . Multiple Vitamin (MULTIVITAMIN) tablet Take 1 tablet by mouth daily.     . nitroGLYCERIN (NITROSTAT) 0.4 MG SL tablet Place 0.4 mg under the tongue every 5 (five) minutes as needed for chest pain.     . pantoprazole (PROTONIX) 40 MG tablet TAKE 1 TABLET BY MOUTH EVERY DAY 90 tablet 2  . sildenafil (REVATIO) 20 MG tablet Take 20-100 mg by mouth as needed (30 minutes prior to sexual activity).   6  . spironolactone (ALDACTONE) 25 MG tablet TAKE 1/2 TABLET BY MOUTH EVERY DAY 45 tablet 1  . XARELTO 20 MG TABS tablet TAKE 1 TABLET  BY MOUTH EVERY DAY 90 tablet 3   No current facility-administered medications for this visit.     Past Surgical History:  Procedure Laterality Date  . BIV PACEMAKER INSERTION CRT-P N/A 12/25/2017   Procedure: BIV PACEMAKER INSERTION CRT-P;  Surgeon: Marinus Mawaylor, Gregg W, MD;  [facility];  Service: Cardiovascular;  Laterality: N/A;  . CARDIOVERSION N/A 02/04/2018   Procedure: CARDIOVERSION;  Surgeon: Antoine PocheBranch, Hattye Siegfried F, MD;  Location[facility];  Service: Endoscopy;  Laterality: N/A;  . CATARACT EXTRACTION    . CORONARY ARTERY BYPASS GRAFT  2001   2001.  LIMA to  LAD, SVG to OM1, SVG to OM 2, SVG to diagonal, SVG to PDA.  Marland Kitchen. CORONARY BALLOON ANGIOPLASTY N/A 08/05/2016   Procedure: Coronary Balloon Angioplasty;  Surgeon: Corky CraftsJayadeep S Varanasi, MD;  [facility];  Service: Cardiovascular;  Laterality: N/A;  CFX  . CORONARY STENT INTERVENTION N/A 08/05/2016   Procedure: Coronary Stent Intervention;  Surgeon: Corky CraftsJayadeep S Varanasi, MD;  [facility];  Service: Cardiovascular;  Laterality: N/A;  OM  . LAPAROSCOPIC CHOLECYSTECTOMY  06/22/2013   Dr. Cristy FolksBeacham  . LEFT HEART CATH AND CORS/GRAFTS ANGIOGRAPHY N/A 08/05/2016   Procedure: Left Heart Cath and Cors/Grafts Angiography;  Surgeon: Corky CraftsJayadeep S Varanasi, MD;  [facility];  Service: Cardiovascular;  Laterality: N/A;     Allergies  Allergen Reactions  . Acetaminophen Other (See Comments)    Hallucinations      Family History  Problem Relation Age of Onset  . Alzheimer's disease Mother   . Brain cancer Mother        brain tumor  . Stroke Father   . Alzheimer's disease Father      Social History Mr. Denzler reports that he has never smoked. He has never used smokeless tobacco. [name] reports current alcohol use.   Review of Systems CONSTITUTIONAL: No weight loss, fever, chills, weakness or fatigue.  HEENT: Eyes: No visual loss, blurred vision, double vision or yellow sclerae.No hearing loss, sneezing, congestion, runny nose or sore throat.  SKIN: No rash or itching.  CARDIOVASCULAR: per hpi RESPIRATORY: No shortness of breath, cough or sputum.  GASTROINTESTINAL: No anorexia, nausea, vomiting or diarrhea. No abdominal pain or blood.  GENITOURINARY: No burning on urination, no polyuria NEUROLOGICAL: No headache, dizziness, syncope, paralysis, ataxia, numbness or tingling in the extremities. No change in bowel or bladder control.  MUSCULOSKELETAL: No muscle, back pain, joint pain or stiffness.  LYMPHATICS: No enlarged nodes. No history of splenectomy.   PSYCHIATRIC: No history of depression or anxiety.  ENDOCRINOLOGIC: No reports of sweating, cold or heat intolerance. No polyuria or polydipsia.  Marland Kitchen.   Physical Examination Today's Vitals   08/01/20 0819  BP: 120/76  Pulse: 70  SpO2: 98%  Weight: 234 lb 6.4 oz (106.3 kg)  Height: 6\' 2"  (1.88 m)   Body mass index is 30.1 kg/m.  Gen: resting comfortably, no acute distress HEENT: no scleral icterus, pupils equal round and reactive, no palptable cervical adenopathy,  CV: RRR, no m/r/g, no jvd Resp: Clear to auscultation bilaterally GI: abdomen is soft, non-tender, non-distended, normal bowel sounds, no hepatosplenomegaly MSK: extremities are warm, no edema.  Skin: warm, no rash Neuro:  no focal deficits Psych: appropriate affect   Diagnostic Studies 01/2013 Echo Study Conclusions  - Left ventricle: Systolic function is low normal, with an estimated EF of 50-55%, but closer to 50%. There is mild global hypokinesis. The cavity size was  normal. Wall thickness was increased in a pattern of mild LVH. There was an increased relative contribution of atrial contraction to ventricular filling. Doppler parameters are consistent with abnormal left ventricular relaxation (grade 1 diastolic dysfunction). - Ventricular septum: Motion consistent with post-operative changes. The contour showed diastolic flattening. - Left atrium: The atrium was mildly dilated.   09/2012 ETT Exc 6 min 50 sec, 10.1 METs. No specific ischemic changes, frequent PVCs and PACs, 3 beat run of NSVT.   06/2016 Nuclear stress test  ECG nondiagnostic due to IVCD. No arrhythmias noted during exercise.  Blood pressure demonstrated a hypertensive response to exercise.  Medium, moderate intensity, inferior/inferolateral defect extended from apex to base. There is a partially reversible zone in the inferolateral wall at the base. Most suggestive of scar with mild peri-infarct ischemia.  This is an intermediate  risk study.  Nuclear stress EF: 40%. Consider confirmation with echocardiography.   07/2016 echo Study Conclusions  - Left ventricle: The cavity size was at the upper limits of normal. Wall thickness was increased in a pattern of mild LVH. The estimated ejection fraction was 40%. Diffuse hypokinesis. There is akinesis of the basalinferior myocardium. Doppler parameters are consistent with abnormal left ventricular relaxation (grade 1 diastolic dysfunction). - Aortic valve: Mildly calcified annulus. Trileaflet. - Mitral valve: Mildly thickened leaflets . There was mild regurgitation. - Left atrium: The atrium was mildly dilated. - Tricuspid valve: There was trivial regurgitation. - Pulmonary arteries: Systolic pressure could not be accurately estimated. - Pericardium, extracardiac: There was no pericardial effusion.  Impressions:  - Mild LVH with upper normal chamber size and LVEF approximately 40%. There is diffuse hypokinesis with inferior basal akinesis. Probable grade 1 diastolic dysfunction. Mild left atrial enlargement. Mildly thickened mitral leaflets with mild mitral regurgitation.   08/2016 cath  Severe native three-vessel coronary artery disease.  Mid RCA lesion, 100 %stenosed. SVG to PDA is occluded. There are right to right and left to right collaterals.  Mid LAD lesion, 100 %stenosed. LIMA to LAD is atretic. LAD fills from flow from the SVG to diagonal filling the LAD.  Ost 2nd Mrg to 2nd Mrg lesion, 99 %stenosed. SVG to OM2 is occluded.  Ost 1st Mrg to 1st Mrg lesion, 80 %stenosed. Free RIMA graft to OM 1 is occluded. A STENT PROMUS PREM MR 3.5X28 drug eluting stent was successfully placed., postdilated to > 78mm.  Post intervention, there is a 0% residual stenosis.  Mid Cx lesion, 80 %stenosed. THis was treated with initial cutting balloon angioplasty with a 2.5 mm balloon prior to stent placement in the OM. Then after stent  placement, PTCA was done with a 2.0 balloon.  Post intervention, there is a 10% residual stenosis.  The left ventricular ejection fraction is 25-35% by visual estimate.  LV end diastolic pressure is moderately elevated.  There is moderate to severe left ventricular systolic dysfunction.  There is no aortic valve stenosis.  Severe coronary artery disease. Severe graft disease. Only patent graft is the SVG to diagonal which feeds both the diagonal and the LAD. Successful stenting of a large OM1 and balloon angioplasty of the mid circumflex. The RCA territory fills by right to right and left to right collaterals. Continue aggressive secondary prevention. He'll need dual antiplatelet therapy for at least a year. Consider clopidogrel longer-term given the extent of his disease.   10/2018 echo IMPRESSIONS   1. The left ventricle has mild-moderately reduced systolic function, with an ejection fraction of 40-45%. The cavity size was  normal. There is mildly increased left ventricular wall thickness. Left ventricular diastolic Doppler parameters are  indeterminate. 2. Left atrial size was severely dilated. 3. Right atrial size was moderately dilated. 4. The aortic valve is tricuspid. Mild thickening of the aortic valve. Mild calcification of the aortic valve. No stenosis of the aortic valve. Mild aortic annular calcification noted. 5. The mitral valve is abnormal. Mild thickening of the mitral valve leaflet. Mild calcification of the mitral valve leaflet. There is mild mitral annular calcification present. No evidence of mitral valve stenosis. 6. The aortic root is normal in size and structure. 7. Pulmonary hypertension is indeterminant, inadequate TR jet. 8. The interatrial septum was not well visualized.    Assessment and Plan  1. CAD/Chronic systolic HF/ICM - denies any recent symptoms - continue current meds  2. Afib - no symptoms, continue current meds including  anticoag  3. Bradycardia -no symptoms, recent normal device check for pacemaker -continue to monitor.  - EKG today shows V paced rhythm  4.  HTN - at [facility], continue curernt meds  F/u 6 months  Antoine Poche, M.D.

## 2020-08-09 ENCOUNTER — Other Ambulatory Visit: Payer: Self-pay | Admitting: Cardiology

## 2020-08-28 ENCOUNTER — Other Ambulatory Visit: Payer: Self-pay | Admitting: Cardiology

## 2020-09-13 ENCOUNTER — Ambulatory Visit (INDEPENDENT_AMBULATORY_CARE_PROVIDER_SITE_OTHER): Payer: Medicare PPO

## 2020-09-13 DIAGNOSIS — I442 Atrioventricular block, complete: Secondary | ICD-10-CM

## 2020-09-13 LAB — CUP PACEART REMOTE DEVICE CHECK
Battery Remaining Longevity: 93 mo
Battery Remaining Percentage: 95.5 %
Battery Voltage: 2.98 V
Brady Statistic AP VP Percent: 0 %
Brady Statistic AP VS Percent: 0 %
Brady Statistic AS VP Percent: 0 %
Brady Statistic AS VS Percent: 0 %
Brady Statistic RA Percent Paced: 1 %
Date Time Interrogation Session: 20220511054540
Implantable Lead Implant Date: 20190822
Implantable Lead Implant Date: 20190822
Implantable Lead Implant Date: 20190822
Implantable Lead Location: 753858
Implantable Lead Location: 753859
Implantable Lead Location: 753860
Implantable Pulse Generator Implant Date: 20190822
Lead Channel Impedance Value: 1050 Ohm
Lead Channel Impedance Value: 560 Ohm
Lead Channel Impedance Value: 590 Ohm
Lead Channel Pacing Threshold Amplitude: 0.625 V
Lead Channel Pacing Threshold Amplitude: 1.25 V
Lead Channel Pacing Threshold Pulse Width: 0.5 ms
Lead Channel Pacing Threshold Pulse Width: 0.5 ms
Lead Channel Sensing Intrinsic Amplitude: 2.3 mV
Lead Channel Sensing Intrinsic Amplitude: 5.9 mV
Lead Channel Setting Pacing Amplitude: 2 V
Lead Channel Setting Pacing Amplitude: 2.5 V
Lead Channel Setting Pacing Amplitude: 3.5 V
Lead Channel Setting Pacing Pulse Width: 0.5 ms
Lead Channel Setting Pacing Pulse Width: 0.5 ms
Lead Channel Setting Sensing Sensitivity: 2 mV
Pulse Gen Model: 3562
Pulse Gen Serial Number: 9056721

## 2020-09-15 DIAGNOSIS — I10 Essential (primary) hypertension: Secondary | ICD-10-CM | POA: Diagnosis not present

## 2020-09-15 DIAGNOSIS — I2510 Atherosclerotic heart disease of native coronary artery without angina pectoris: Secondary | ICD-10-CM | POA: Diagnosis not present

## 2020-09-15 DIAGNOSIS — Z0001 Encounter for general adult medical examination with abnormal findings: Secondary | ICD-10-CM | POA: Diagnosis not present

## 2020-09-15 DIAGNOSIS — E7849 Other hyperlipidemia: Secondary | ICD-10-CM | POA: Diagnosis not present

## 2020-09-15 DIAGNOSIS — I4891 Unspecified atrial fibrillation: Secondary | ICD-10-CM | POA: Diagnosis not present

## 2020-09-20 ENCOUNTER — Other Ambulatory Visit: Payer: Self-pay | Admitting: Cardiology

## 2020-10-05 NOTE — Progress Notes (Signed)
Remote pacemaker transmission.   

## 2020-10-10 ENCOUNTER — Telehealth: Payer: Self-pay | Admitting: Emergency Medicine

## 2020-10-10 NOTE — Telephone Encounter (Signed)
LMOM to call device clinic # provided. Alert received for LV impedance > 3000 ohms. Need remote transmission.

## 2020-10-11 NOTE — Telephone Encounter (Signed)
Follow up transmission received. LV lead impedance still out of range. Attempted to contact patient to make device apt. For in clinic testing. No answer, LMTCB.

## 2020-10-11 NOTE — Telephone Encounter (Signed)
Pt returned phone call.  Advised of elevated impedences on LV lead X2,  Needs in-clinic testing.  Scheduled patient for device clinic on 10/12/20 at [address]

## 2020-10-12 ENCOUNTER — Ambulatory Visit (INDEPENDENT_AMBULATORY_CARE_PROVIDER_SITE_OTHER): Payer: Medicare PPO | Admitting: Emergency Medicine

## 2020-10-12 ENCOUNTER — Other Ambulatory Visit: Payer: Self-pay

## 2020-10-12 DIAGNOSIS — I5042 Chronic combined systolic (congestive) and diastolic (congestive) heart failure: Secondary | ICD-10-CM

## 2020-10-12 DIAGNOSIS — Z950 Presence of cardiac pacemaker: Secondary | ICD-10-CM | POA: Diagnosis not present

## 2020-10-12 DIAGNOSIS — R001 Bradycardia, unspecified: Secondary | ICD-10-CM | POA: Diagnosis not present

## 2020-10-12 LAB — CUP PACEART INCLINIC DEVICE CHECK
Battery Remaining Longevity: 96 mo
Battery Voltage: 2.98 V
Brady Statistic RA Percent Paced: 0 %
Brady Statistic RV Percent Paced: 98 %
Date Time Interrogation Session: 20220609094223
Implantable Lead Implant Date: 20190822
Implantable Lead Implant Date: 20190822
Implantable Lead Implant Date: 20190822
Implantable Lead Location: 753858
Implantable Lead Location: 753859
Implantable Lead Location: 753860
Implantable Pulse Generator Implant Date: 20190822
Lead Channel Impedance Value: 462.5 Ohm
Lead Channel Impedance Value: 525 Ohm
Lead Channel Impedance Value: 562.5 Ohm
Lead Channel Pacing Threshold Amplitude: 0.625 V
Lead Channel Pacing Threshold Amplitude: 0.875 V
Lead Channel Pacing Threshold Pulse Width: 0.5 ms
Lead Channel Pacing Threshold Pulse Width: 0.5 ms
Lead Channel Sensing Intrinsic Amplitude: 4.5 mV
Lead Channel Sensing Intrinsic Amplitude: 5 mV
Lead Channel Setting Pacing Amplitude: 2 V
Lead Channel Setting Pacing Amplitude: 2 V
Lead Channel Setting Pacing Amplitude: 2.5 V
Lead Channel Setting Pacing Pulse Width: 0.5 ms
Lead Channel Setting Pacing Pulse Width: 0.5 ms
Lead Channel Setting Sensing Sensitivity: 4 mV
Pulse Gen Model: 3562
Pulse Gen Serial Number: 9056721

## 2020-10-12 NOTE — Progress Notes (Signed)
CRT-P device check in clinic. Normal device function. Thresholds, sensing, impedance consistent with previous measurements. LV impedance elevated at 3000 ohms in vector LV 3 to can. Vectors tested LV1 to can: impedance 530 ohms with PNS @ 3 V with threshold of 0.5 V @ 0.5 ms. LV2- can : impedance 460 ohms, no PNS @ 7 V, threshold 1 V @ 0.5 ms. LV 3 to can: impedance > 3000 ohms. LV4 to can : impedance 410 ohms with threshold of 3 V at 0.5 ms. Programmed LV2 to can with autocapture on. Industry present for testing and programming. Histograms appropriate for patient and level of activity. No mode switches or ventricular high rate episodes. Patient bi-ventricularly pacing 98% of the time. Device programmed with appropriate safety margins. Device heart failure diagnostics are within normal limits and stable over time. Estimated longevity 8. Patient enrolled in remote follow-up and next remote 12/13/20.

## 2020-12-13 ENCOUNTER — Ambulatory Visit (INDEPENDENT_AMBULATORY_CARE_PROVIDER_SITE_OTHER): Payer: Medicare PPO

## 2020-12-13 DIAGNOSIS — I442 Atrioventricular block, complete: Secondary | ICD-10-CM

## 2020-12-14 LAB — CUP PACEART REMOTE DEVICE CHECK
Battery Remaining Longevity: 59 mo
Battery Remaining Percentage: 59 %
Battery Voltage: 2.98 V
Brady Statistic AP VP Percent: 0 %
Brady Statistic AP VS Percent: 0 %
Brady Statistic AS VP Percent: 0 %
Brady Statistic AS VS Percent: 0 %
Brady Statistic RA Percent Paced: 1 %
Date Time Interrogation Session: 20220811102839
Implantable Lead Implant Date: 20190822
Implantable Lead Implant Date: 20190822
Implantable Lead Implant Date: 20190822
Implantable Lead Location: 753858
Implantable Lead Location: 753859
Implantable Lead Location: 753860
Implantable Pulse Generator Implant Date: 20190822
Lead Channel Impedance Value: 550 Ohm
Lead Channel Impedance Value: 600 Ohm
Lead Channel Impedance Value: 650 Ohm
Lead Channel Pacing Threshold Amplitude: 0.625 V
Lead Channel Pacing Threshold Amplitude: 0.875 V
Lead Channel Pacing Threshold Pulse Width: 0.5 ms
Lead Channel Pacing Threshold Pulse Width: 0.5 ms
Lead Channel Sensing Intrinsic Amplitude: 11.9 mV
Lead Channel Sensing Intrinsic Amplitude: 2.3 mV
Lead Channel Setting Pacing Amplitude: 2 V
Lead Channel Setting Pacing Amplitude: 2 V
Lead Channel Setting Pacing Amplitude: 2.5 V
Lead Channel Setting Pacing Pulse Width: 0.5 ms
Lead Channel Setting Pacing Pulse Width: 0.5 ms
Lead Channel Setting Sensing Sensitivity: 4 mV
Pulse Gen Model: 3562
Pulse Gen Serial Number: 9056721

## 2021-01-04 NOTE — Progress Notes (Signed)
Remote pacemaker transmission.   

## 2021-01-28 ENCOUNTER — Other Ambulatory Visit: Payer: Self-pay | Admitting: Cardiology

## 2021-01-29 DIAGNOSIS — Z23 Encounter for immunization: Secondary | ICD-10-CM | POA: Diagnosis not present

## 2021-02-02 ENCOUNTER — Other Ambulatory Visit: Payer: Self-pay

## 2021-02-02 ENCOUNTER — Ambulatory Visit (INDEPENDENT_AMBULATORY_CARE_PROVIDER_SITE_OTHER): Payer: Medicare PPO | Admitting: Cardiology

## 2021-02-02 ENCOUNTER — Encounter: Payer: Self-pay | Admitting: Cardiology

## 2021-02-02 VITALS — BP 124/70 | HR 69 | Ht 74.0 in | Wt 230.0 lb

## 2021-02-02 DIAGNOSIS — R001 Bradycardia, unspecified: Secondary | ICD-10-CM

## 2021-02-02 DIAGNOSIS — I4891 Unspecified atrial fibrillation: Secondary | ICD-10-CM | POA: Diagnosis not present

## 2021-02-02 DIAGNOSIS — I2510 Atherosclerotic heart disease of native coronary artery without angina pectoris: Secondary | ICD-10-CM

## 2021-02-02 DIAGNOSIS — I10 Essential (primary) hypertension: Secondary | ICD-10-CM | POA: Diagnosis not present

## 2021-02-02 DIAGNOSIS — E782 Mixed hyperlipidemia: Secondary | ICD-10-CM | POA: Diagnosis not present

## 2021-02-02 DIAGNOSIS — I5022 Chronic systolic (congestive) heart failure: Secondary | ICD-10-CM

## 2021-02-02 NOTE — Patient Instructions (Addendum)
Medication Instructions:  Continue all current medications.   Labwork: none  Testing/Procedures: none  Follow-Up: 6 months   Any Other Special Instructions Will Be Listed Below (If Applicable).   If you need a refill on your cardiac medications before your next appointment, please call your pharmacy.  

## 2021-02-02 NOTE — Progress Notes (Signed)
Clinical Summary Mr. Omary is a 70 y.o.male seen today for follow up of the following medical problems.      1. CAD/ICM/Chronic systolic HF - hx of CABG in 2001, at [facility].   - GXT 09/2012 with no ischemic changes, did have frequent PVCs, 3 beat run of NSVT - echo 01/2013 LVEF 50%, grade I diastoilc dysfunction.  - echo 07/2016 LVEF 40%, grade I diastolic dysfunction - 06/2016 nuclear stress primarily scar with mild peri-infarct ischemia. LVEF 40%, intermediate risk.      - cath 08/2016: mid LAD occluded, mid LCX 80%, RCA 100%. OM 1 80%. OM2 99%. SVG-RPDA occluded, LIMA-LAD atretic and occluded in midportion, LAD fills from SVG-diag. SVG-D2 patent, RIMA-OM1 occluded, SVG-OM2 occluded. RCA territory fills by collaterals Received angiolpasty to LCX, OM1 80% receivded DES. Recs for indefinite plavix.     03/2018 echo LVEF 25-30%, diffuse hypokinesis, grade II diastoilc dysfunction, mild MR   10/2018 echo LVEF 40-45%       - no recent chest pain. No SOB/DOE>, no recent edema - compliant with meds     2. Symptomatic bradycardia - s/p pacemaker placement 12/2017, St Jude BiV pacemaker for complete AV block. At time of implant LVEF was 40%     12/2020 normal device check - no recent symptoms     3. Afib - new diagnosis during 12/2017 admission  - s/p DCCV 02/04/18. Returned back to afib. Plan for rate control strategy   - no reent palpitations - no bleedingo n xarelto     4. Hyperlipidemia   02/2020 TC 123 TG 92 HDL 37 LDL 68 - he remains compliant with atorvastatin     5.  HTN -compliant with meds   Past Medical History:  Diagnosis Date   AF (atrial fibrillation) (HCC) 12/29/2017   CAD in native artery    08/05/16 Cath, atresia of LIMA to LAD, occlusion of SVG-->RCA, SVG-->Circumflex/OM2, SVG-->OM1, patent SVG to diag, DES to native circumflex.    Erectile dysfunction    Hyperlipidemia, mixed    Myocardial infarction [facility]) 2001   Out of hospital myocardial  infarction/inferior wall, status post Cardiolite study 2007.  Ejection fraction 45%   Pacemaker 12/2017   Unspecified essential hypertension      Allergies  Allergen Reactions   Acetaminophen Other (See Comments)    Hallucinations     Current Outpatient Medications  Medication Sig Dispense Refill   atorvastatin (LIPITOR) 80 MG tablet TAKE 1 TABLET BY MOUTH EVERY DAY 90 tablet 3   clonazePAM (KLONOPIN) 0.5 MG tablet Take 0.5 mg by mouth at bedtime as needed for anxiety.     ENTRESTO 97-103 MG TAKE 1 TABLET BY MOUTH TWICE DAILY 90 tablet 3   metoprolol (TOPROL-XL) 200 MG 24 hr tablet TAKE 1 TABLET BY MOUTH EVERY DAY 90 tablet 3   Multiple Vitamin (MULTIVITAMIN) tablet Take 1 tablet by mouth daily.      nitroGLYCERIN (NITROSTAT) 0.4 MG SL tablet Place 0.4 mg under the tongue every 5 (five) minutes as needed for chest pain.     pantoprazole (PROTONIX) 40 MG tablet TAKE 1 TABLET BY MOUTH EVERY DAY 90 tablet 2   sildenafil (REVATIO) 20 MG tablet Take 20-100 mg by mouth as needed (30 minutes prior to sexual activity).   6   spironolactone (ALDACTONE) 25 MG tablet TAKE 1/2 TABLET BY MOUTH EVERY DAY 45 tablet 2   XARELTO 20 MG TABS tablet TAKE 1 TABLET BY MOUTH EVERY DAY 90 tablet  3   No current facility-administered medications for this visit.     Past Surgical History:  Procedure Laterality Date   BIV PACEMAKER INSERTION CRT-P N/A 12/25/2017   Procedure: BIV PACEMAKER INSERTION CRT-P;  Surgeon: Marinus Maw, MD;  Location: [facility] INVASIVE CV LAB;  Service: Cardiovascular;  Laterality: N/A;   CARDIOVERSION N/A 02/04/2018   Procedure: CARDIOVERSION;  Surgeon: Antoine Poche, MD;  [facility];  Service: Endoscopy;  Laterality: N/A;   CATARACT EXTRACTION     CORONARY ARTERY BYPASS GRAFT  2001   2001.  LIMA to LAD, SVG to OM1, SVG to OM 2, SVG to diagonal, SVG to PDA.   CORONARY BALLOON ANGIOPLASTY N/A 08/05/2016   Procedure: Coronary Balloon Angioplasty;  Surgeon: Corky Crafts, MD;  Location: [facility] INVASIVE CV LAB;  Service: Cardiovascular;  Laterality: N/A;  CFX   CORONARY STENT INTERVENTION N/A 08/05/2016   Procedure: Coronary Stent Intervention;  Surgeon: Corky Crafts, MD;  Location: [facility] INVASIVE CV LAB;  Service: Cardiovascular;  Laterality: N/A;  OM   LAPAROSCOPIC CHOLECYSTECTOMY  06/22/2013   Dr. Cristy Folks   LEFT HEART CATH AND CORS/GRAFTS ANGIOGRAPHY N/A 08/05/2016   Procedure: Left Heart Cath and Cors/Grafts Angiography;  Surgeon: Corky Crafts, MD;  Location: [facility] INVASIVE CV LAB;  Service: Cardiovascular;  Laterality: N/A;     Allergies  Allergen Reactions   Acetaminophen Other (See Comments)    Hallucinations      Family History  Problem Relation Age of Onset   Alzheimer's disease Mother    Brain cancer Mother        brain tumor   Stroke Father    Alzheimer's disease Father      Social History Mr. Mcdougle reports that he has never smoked. He has never used smokeless tobacco. Mr. Sulton reports current alcohol use.   Review of Systems CONSTITUTIONAL: No weight loss, fever, chills, weakness or fatigue.  HEENT: Eyes: No visual loss, blurred vision, double vision or yellow sclerae.No hearing loss, sneezing, congestion, runny nose or sore throat.  SKIN: No rash or itching.  CARDIOVASCULAR: per hpi RESPIRATORY: No shortness of breath, cough or sputum.  GASTROINTESTINAL: No anorexia, nausea, vomiting or diarrhea. No abdominal pain or blood.  GENITOURINARY: No burning on urination, no polyuria NEUROLOGICAL: No headache, dizziness, syncope, paralysis, ataxia, numbness or tingling in the extremities. No change in bowel or bladder control.  MUSCULOSKELETAL: No muscle, back pain, joint pain or stiffness.  LYMPHATICS: No enlarged nodes. No history of splenectomy.  PSYCHIATRIC: No history of depression or anxiety.  ENDOCRINOLOGIC: No reports of sweating, cold or heat intolerance. No polyuria or polydipsia.  Marland Kitchen   Physical  Examination Today's Vitals   02/02/21 1103  BP: 124/70  Pulse: 69  SpO2: 95%  Weight: 230 lb (104.3 kg)  Height: 6\' 2"  (1.88 m)   Body mass index is 29.53 kg/m.  Gen: resting comfortably, no acute distress HEENT: no scleral icterus, pupils equal round and reactive, no palptable cervical adenopathy,  CV: RRR, no m/r/g no jvd Resp: Clear to auscultation bilaterally GI: abdomen is soft, non-tender, non-distended, normal bowel sounds, no hepatosplenomegaly MSK: extremities are warm, no edema.  Skin: warm, no rash Neuro:  no focal deficits Psych: appropriate affect   Diagnostic Studies 01/2013 Echo Study Conclusions  - Left ventricle: Systolic function is low normal, with an estimated EF of 50-55%, but closer to 50%. There is mild global hypokinesis. The cavity size was normal. Wall thickness was increased in a pattern  of mild LVH. There was an increased relative contribution of atrial contraction to ventricular filling. Doppler parameters are consistent with abnormal left ventricular relaxation (grade 1 diastolic dysfunction). - Ventricular septum: Motion consistent with post-operative changes. The contour showed diastolic flattening. - Left atrium: The atrium was mildly dilated.      09/2012 ETT Exc 6 min 50 sec, 10.1 METs. No specific ischemic changes, frequent PVCs and PACs, 3 beat run of NSVT.    06/2016 Nuclear stress test ECG nondiagnostic due to IVCD. No arrhythmias noted during exercise. Blood pressure demonstrated a hypertensive response to exercise. Medium, moderate intensity, inferior/inferolateral defect extended from apex to base. There is a partially reversible zone in the inferolateral wall at the base. Most suggestive of scar with mild peri-infarct ischemia. This is an intermediate risk study. Nuclear stress EF: 40%. Consider confirmation with echocardiography.     07/2016 echo Study Conclusions   - Left ventricle: The cavity size was at the upper limits  of   normal. Wall thickness was increased in a pattern of mild LVH.   The estimated ejection fraction was 40%. Diffuse hypokinesis.   There is akinesis of the basalinferior myocardium. Doppler   parameters are consistent with abnormal left ventricular   relaxation (grade 1 diastolic dysfunction). - Aortic valve: Mildly calcified annulus. Trileaflet. - Mitral valve: Mildly thickened leaflets . There was mild   regurgitation. - Left atrium: The atrium was mildly dilated. - Tricuspid valve: There was trivial regurgitation. - Pulmonary arteries: Systolic pressure could not be accurately   estimated. - Pericardium, extracardiac: There was no pericardial effusion.   Impressions:   - Mild LVH with upper normal chamber size and LVEF approximately   40%. There is diffuse hypokinesis with inferior basal akinesis.   Probable grade 1 diastolic dysfunction. Mild left atrial   enlargement. Mildly thickened mitral leaflets with mild mitral   regurgitation.     08/2016 cath Severe native three-vessel coronary artery disease. Mid RCA lesion, 100 %stenosed. SVG to PDA is occluded. There are right to right and left to right collaterals. Mid LAD lesion, 100 %stenosed. LIMA to LAD is atretic. LAD fills from flow from the SVG to diagonal filling the LAD. Ost 2nd Mrg to 2nd Mrg lesion, 99 %stenosed. SVG to OM2 is occluded. Ost 1st Mrg to 1st Mrg lesion, 80 %stenosed. Free RIMA graft to OM 1 is occluded. A STENT PROMUS PREM MR [id number] drug eluting stent was successfully placed., postdilated to > 9mm. Post intervention, there is a 0% residual stenosis. Mid Cx lesion, 80 %stenosed. THis was treated with initial cutting balloon angioplasty with a 2.5 mm balloon prior to stent placement in the OM. Then after stent placement, PTCA was done with a 2.0 balloon. Post intervention, there is a 10% residual stenosis. The left ventricular ejection fraction is 25-35% by visual estimate. LV end diastolic pressure is  moderately elevated. There is moderate to severe left ventricular systolic dysfunction. There is no aortic valve stenosis.   Severe coronary artery disease. Severe graft disease. Only patent graft is the SVG to diagonal which feeds both the diagonal and the LAD. Successful stenting of a large OM1 and balloon angioplasty of the mid circumflex. The RCA territory fills by right to right and left to right collaterals. Continue aggressive secondary prevention. He'll need dual antiplatelet therapy for at least a year. Consider clopidogrel longer-term given the extent of his disease.     10/2018 echo IMPRESSIONS      1. The left ventricle has  mild-moderately reduced systolic function, with an ejection fraction of 40-45%. The cavity size was normal. There is mildly increased left ventricular wall thickness. Left ventricular diastolic Doppler parameters are  indeterminate.  2. Left atrial size was severely dilated.  3. Right atrial size was moderately dilated.  4. The aortic valve is tricuspid. Mild thickening of the aortic valve. Mild calcification of the aortic valve. No stenosis of the aortic valve. Mild aortic annular calcification noted.  5. The mitral valve is abnormal. Mild thickening of the mitral valve leaflet. Mild calcification of the mitral valve leaflet. There is mild mitral annular calcification present. No evidence of mitral valve stenosis.  6. The aortic root is normal in size and structure.  7. Pulmonary hypertension is indeterminant, inadequate TR jet.  8. The interatrial septum was not well visualized.      Assessment and Plan  1. CAD/Chronic systolic HF/ICM - no recent symptosm - we discussed starting farxiga, he would like to hold off for now and give it some thought - continue current meds   2. Afib - no symptoms, he will continue current meds   3. Bradycardia/Pacemaker -normal recent device check, no symptoms - continue to monitor pacemaker   4.  HTN - he is at  goal, continue current med  5. Hyperlipidemia - LDL has been at goal - he reports upcoming labs with pcp  F/u 6 months  Antoine Poche, M.D.

## 2021-02-07 DIAGNOSIS — C44722 Squamous cell carcinoma of skin of right lower limb, including hip: Secondary | ICD-10-CM | POA: Diagnosis not present

## 2021-02-07 DIAGNOSIS — C44712 Basal cell carcinoma of skin of right lower limb, including hip: Secondary | ICD-10-CM | POA: Diagnosis not present

## 2021-02-07 DIAGNOSIS — L570 Actinic keratosis: Secondary | ICD-10-CM | POA: Diagnosis not present

## 2021-02-13 DIAGNOSIS — C44722 Squamous cell carcinoma of skin of right lower limb, including hip: Secondary | ICD-10-CM | POA: Diagnosis not present

## 2021-02-19 DIAGNOSIS — Z23 Encounter for immunization: Secondary | ICD-10-CM | POA: Diagnosis not present

## 2021-03-14 ENCOUNTER — Ambulatory Visit (INDEPENDENT_AMBULATORY_CARE_PROVIDER_SITE_OTHER): Payer: Medicare PPO

## 2021-03-14 DIAGNOSIS — I442 Atrioventricular block, complete: Secondary | ICD-10-CM | POA: Diagnosis not present

## 2021-03-14 LAB — CUP PACEART REMOTE DEVICE CHECK
Battery Remaining Longevity: 55 mo
Battery Remaining Percentage: 56 %
Battery Voltage: 2.96 V
Brady Statistic AP VP Percent: 0 %
Brady Statistic AP VS Percent: 0 %
Brady Statistic AS VP Percent: 0 %
Brady Statistic AS VS Percent: 0 %
Brady Statistic RA Percent Paced: 1 %
Date Time Interrogation Session: 20221109021133
Implantable Lead Implant Date: 20190822
Implantable Lead Implant Date: 20190822
Implantable Lead Implant Date: 20190822
Implantable Lead Location: 753858
Implantable Lead Location: 753859
Implantable Lead Location: 753860
Implantable Pulse Generator Implant Date: 20190822
Lead Channel Impedance Value: 550 Ohm
Lead Channel Impedance Value: 580 Ohm
Lead Channel Impedance Value: 650 Ohm
Lead Channel Pacing Threshold Amplitude: 0.625 V
Lead Channel Pacing Threshold Amplitude: 0.875 V
Lead Channel Pacing Threshold Pulse Width: 0.5 ms
Lead Channel Pacing Threshold Pulse Width: 0.5 ms
Lead Channel Sensing Intrinsic Amplitude: 2.3 mV
Lead Channel Sensing Intrinsic Amplitude: 8.6 mV
Lead Channel Setting Pacing Amplitude: 2 V
Lead Channel Setting Pacing Amplitude: 2 V
Lead Channel Setting Pacing Amplitude: 2.5 V
Lead Channel Setting Pacing Pulse Width: 0.5 ms
Lead Channel Setting Pacing Pulse Width: 0.5 ms
Lead Channel Setting Sensing Sensitivity: 4 mV
Pulse Gen Model: 3562
Pulse Gen Serial Number: 9056721

## 2021-03-22 NOTE — Progress Notes (Signed)
Remote pacemaker transmission.   

## 2021-03-29 ENCOUNTER — Other Ambulatory Visit: Payer: Self-pay | Admitting: Cardiology

## 2021-03-29 DIAGNOSIS — Z79899 Other long term (current) drug therapy: Secondary | ICD-10-CM

## 2021-04-02 NOTE — Telephone Encounter (Signed)
Prescription refill request for Xarelto received.  Indication: Atrial fib  Last office visit: 01/23/21  Dominga Ferry MD Weight: 104.3kg Age: 70 Scr: 1.05 on 02/16/20 CrCl: 99.33  Based on above findings Xarelto 20mg  daily is the appropriate dose.  Pt is past due for blood work.  Message sent to A [facility] CMA to order CBC/BMP.  Refill approved x 1 only.

## 2021-04-02 NOTE — Telephone Encounter (Signed)
Labs entered for CBC/BMP. Called pt and left a message for pt to call office back regarding labs.

## 2021-05-21 DIAGNOSIS — L570 Actinic keratosis: Secondary | ICD-10-CM | POA: Diagnosis not present

## 2021-06-13 ENCOUNTER — Ambulatory Visit (INDEPENDENT_AMBULATORY_CARE_PROVIDER_SITE_OTHER): Payer: Medicare PPO

## 2021-06-13 DIAGNOSIS — I442 Atrioventricular block, complete: Secondary | ICD-10-CM

## 2021-06-13 LAB — CUP PACEART REMOTE DEVICE CHECK
Battery Remaining Longevity: 52 mo
Battery Remaining Percentage: 52 %
Battery Voltage: 2.96 V
Brady Statistic AP VP Percent: 0 %
Brady Statistic AP VS Percent: 0 %
Brady Statistic AS VP Percent: 0 %
Brady Statistic AS VS Percent: 0 %
Brady Statistic RA Percent Paced: 1 %
Date Time Interrogation Session: 20230208020014
Implantable Lead Implant Date: 20190822
Implantable Lead Implant Date: 20190822
Implantable Lead Implant Date: 20190822
Implantable Lead Location: 753858
Implantable Lead Location: 753859
Implantable Lead Location: 753860
Implantable Pulse Generator Implant Date: 20190822
Lead Channel Impedance Value: 550 Ohm
Lead Channel Impedance Value: 590 Ohm
Lead Channel Impedance Value: 660 Ohm
Lead Channel Pacing Threshold Amplitude: 0.625 V
Lead Channel Pacing Threshold Amplitude: 0.875 V
Lead Channel Pacing Threshold Pulse Width: 0.5 ms
Lead Channel Pacing Threshold Pulse Width: 0.5 ms
Lead Channel Sensing Intrinsic Amplitude: 12 mV
Lead Channel Sensing Intrinsic Amplitude: 2.3 mV
Lead Channel Setting Pacing Amplitude: 2 V
Lead Channel Setting Pacing Amplitude: 2 V
Lead Channel Setting Pacing Amplitude: 2.5 V
Lead Channel Setting Pacing Pulse Width: 0.5 ms
Lead Channel Setting Pacing Pulse Width: 0.5 ms
Lead Channel Setting Sensing Sensitivity: 4 mV
Pulse Gen Model: 3562
Pulse Gen Serial Number: 9056721

## 2021-06-18 NOTE — Progress Notes (Signed)
Remote pacemaker transmission.   

## 2021-06-23 ENCOUNTER — Other Ambulatory Visit: Payer: Self-pay | Admitting: Cardiology

## 2021-06-25 NOTE — Telephone Encounter (Signed)
Pt last saw Dr Wyline Mood 02/02/21, last labs 02/16/20 Creat 1.05 at [facility] per [facility], pt is overdue for follow-up labwork.  Orders are in Epic for labwork, but pt has not had them done. Called spoke with pt, advised he needs labwork done at [facility] hospital to check current dosage of Xarelto.  Pt states he can go on Wednesday 06/27/21.  Will forward message to Leonides Schanz, CMA who had contacted pt regarding labs and let her know.  Will await lab results to refill rx.

## 2021-06-27 ENCOUNTER — Other Ambulatory Visit: Payer: Self-pay | Admitting: *Deleted

## 2021-06-27 DIAGNOSIS — Z79899 Other long term (current) drug therapy: Secondary | ICD-10-CM

## 2021-06-28 NOTE — Telephone Encounter (Signed)
Prescription refill request for Xarelto received.  Indication: Atrial fib Last office visit: 02/02/21  Zandra Abts MD Weight: 104.3kg Age: 71 Scr: 1.17 on 06/27/21 CrCl: 89.15  Based on above findings Xarelto 20mg  daily is the appropriate dose.  Refill approved.  Pt is due for 6 month follow up with Dr Harl Bowie in March.  Message sent to scheduler to make appt.

## 2021-07-03 ENCOUNTER — Encounter: Payer: Self-pay | Admitting: *Deleted

## 2021-07-03 ENCOUNTER — Telehealth: Payer: Self-pay | Admitting: *Deleted

## 2021-07-03 NOTE — Telephone Encounter (Signed)
-----   Message from Eustace Moore, RN sent at 07/03/2021  1:42 PM EST -----  ----- Message ----- From: Antoine Poche, MD Sent: 07/01/2021  10:47 AM EST To: Eustace Moore, RN  Normal labs  Dominga Ferry MD

## 2021-07-03 NOTE — Telephone Encounter (Signed)
Voice mail not set up.  No active my chart.  Normal letter mailed for notification today.

## 2021-07-11 DIAGNOSIS — L570 Actinic keratosis: Secondary | ICD-10-CM | POA: Diagnosis not present

## 2021-07-23 ENCOUNTER — Other Ambulatory Visit: Payer: Self-pay | Admitting: Cardiology

## 2021-08-22 ENCOUNTER — Other Ambulatory Visit: Payer: Self-pay | Admitting: Cardiology

## 2021-09-12 ENCOUNTER — Ambulatory Visit (INDEPENDENT_AMBULATORY_CARE_PROVIDER_SITE_OTHER): Payer: Medicare PPO

## 2021-09-12 DIAGNOSIS — I442 Atrioventricular block, complete: Secondary | ICD-10-CM

## 2021-09-12 LAB — CUP PACEART REMOTE DEVICE CHECK
Battery Remaining Longevity: 48 mo
Battery Remaining Percentage: 49 %
Battery Voltage: 2.96 V
Brady Statistic AP VP Percent: 0 %
Brady Statistic AP VS Percent: 0 %
Brady Statistic AS VP Percent: 0 %
Brady Statistic AS VS Percent: 0 %
Brady Statistic RA Percent Paced: 1 %
Date Time Interrogation Session: 20230510023842
Implantable Lead Implant Date: 20190822
Implantable Lead Implant Date: 20190822
Implantable Lead Implant Date: 20190822
Implantable Lead Location: 753858
Implantable Lead Location: 753859
Implantable Lead Location: 753860
Implantable Pulse Generator Implant Date: 20190822
Lead Channel Impedance Value: 550 Ohm
Lead Channel Impedance Value: 590 Ohm
Lead Channel Impedance Value: 640 Ohm
Lead Channel Pacing Threshold Amplitude: 0.625 V
Lead Channel Pacing Threshold Amplitude: 0.875 V
Lead Channel Pacing Threshold Pulse Width: 0.5 ms
Lead Channel Pacing Threshold Pulse Width: 0.5 ms
Lead Channel Sensing Intrinsic Amplitude: 10.8 mV
Lead Channel Sensing Intrinsic Amplitude: 2.3 mV
Lead Channel Setting Pacing Amplitude: 2 V
Lead Channel Setting Pacing Amplitude: 2 V
Lead Channel Setting Pacing Amplitude: 2.5 V
Lead Channel Setting Pacing Pulse Width: 0.5 ms
Lead Channel Setting Pacing Pulse Width: 0.5 ms
Lead Channel Setting Sensing Sensitivity: 4 mV
Pulse Gen Model: 3562
Pulse Gen Serial Number: 9056721

## 2021-09-20 NOTE — Progress Notes (Signed)
Remote pacemaker transmission.   

## 2021-09-21 ENCOUNTER — Other Ambulatory Visit: Payer: Self-pay | Admitting: Cardiology

## 2021-10-21 ENCOUNTER — Other Ambulatory Visit: Payer: Self-pay | Admitting: Cardiology

## 2021-10-22 ENCOUNTER — Other Ambulatory Visit: Payer: Self-pay | Admitting: Cardiology

## 2021-11-08 DIAGNOSIS — Z961 Presence of intraocular lens: Secondary | ICD-10-CM | POA: Diagnosis not present

## 2021-11-08 DIAGNOSIS — H26491 Other secondary cataract, right eye: Secondary | ICD-10-CM | POA: Diagnosis not present

## 2021-12-05 ENCOUNTER — Other Ambulatory Visit: Payer: Self-pay | Admitting: Cardiology

## 2021-12-06 ENCOUNTER — Other Ambulatory Visit: Payer: Self-pay

## 2021-12-06 MED ORDER — METOPROLOL SUCCINATE ER 200 MG PO TB24: 200.0000 mg | ORAL_TABLET | Freq: Every day | ORAL | 0 refills | Status: DC

## 2021-12-12 ENCOUNTER — Ambulatory Visit (INDEPENDENT_AMBULATORY_CARE_PROVIDER_SITE_OTHER): Payer: Medicare PPO

## 2021-12-12 DIAGNOSIS — I442 Atrioventricular block, complete: Secondary | ICD-10-CM | POA: Diagnosis not present

## 2021-12-12 LAB — CUP PACEART REMOTE DEVICE CHECK
Battery Remaining Longevity: 46 mo
Battery Remaining Percentage: 45 %
Battery Voltage: 2.96 V
Brady Statistic AP VP Percent: 0 %
Brady Statistic AP VS Percent: 0 %
Brady Statistic AS VP Percent: 0 %
Brady Statistic AS VS Percent: 0 %
Brady Statistic RA Percent Paced: 1 %
Date Time Interrogation Session: 20230809020013
Implantable Lead Implant Date: 20190822
Implantable Lead Implant Date: 20190822
Implantable Lead Implant Date: 20190822
Implantable Lead Location: 753858
Implantable Lead Location: 753859
Implantable Lead Location: 753860
Implantable Pulse Generator Implant Date: 20190822
Lead Channel Impedance Value: 550 Ohm
Lead Channel Impedance Value: 600 Ohm
Lead Channel Impedance Value: 650 Ohm
Lead Channel Pacing Threshold Amplitude: 0.5 V
Lead Channel Pacing Threshold Amplitude: 0.75 V
Lead Channel Pacing Threshold Pulse Width: 0.5 ms
Lead Channel Pacing Threshold Pulse Width: 0.5 ms
Lead Channel Sensing Intrinsic Amplitude: 12 mV
Lead Channel Sensing Intrinsic Amplitude: 2.3 mV
Lead Channel Setting Pacing Amplitude: 2 V
Lead Channel Setting Pacing Amplitude: 2 V
Lead Channel Setting Pacing Amplitude: 2.5 V
Lead Channel Setting Pacing Pulse Width: 0.5 ms
Lead Channel Setting Pacing Pulse Width: 0.5 ms
Lead Channel Setting Sensing Sensitivity: 4 mV
Pulse Gen Model: 3562
Pulse Gen Serial Number: 9056721

## 2021-12-20 ENCOUNTER — Other Ambulatory Visit: Payer: Self-pay | Admitting: Cardiology

## 2021-12-20 NOTE — Telephone Encounter (Signed)
Prescription refill request for Xarelto received.  Indication: afib Last office visit: [place], 02/02/2021 Weight:104.3 kg  Age: 71 Scr: 1.17, 06/27/2021 CrCl: 88 ml/min   Refill sent.

## 2021-12-26 ENCOUNTER — Other Ambulatory Visit: Payer: Self-pay | Admitting: Cardiology

## 2022-01-03 ENCOUNTER — Other Ambulatory Visit: Payer: Self-pay | Admitting: Cardiology

## 2022-01-14 NOTE — Progress Notes (Signed)
Remote pacemaker transmission.   

## 2022-01-21 DIAGNOSIS — L570 Actinic keratosis: Secondary | ICD-10-CM | POA: Diagnosis not present

## 2022-01-28 ENCOUNTER — Telehealth: Payer: Self-pay | Admitting: Cardiology

## 2022-01-28 MED ORDER — ATORVASTATIN CALCIUM 80 MG PO TABS: 80.0000 mg | ORAL_TABLET | Freq: Every day | ORAL | 0 refills | Status: DC

## 2022-01-28 NOTE — Telephone Encounter (Signed)
Completed.

## 2022-01-28 NOTE — Telephone Encounter (Signed)
Gave #7 pills Need appointment

## 2022-01-28 NOTE — Telephone Encounter (Signed)
*  STAT* If patient is at the pharmacy, call can be transferred to refill team.   1. Which medications need to be refilled? (please list name of each medication and dose if known)   atorvastatin (LIPITOR) 80 MG tablet    2. Which pharmacy/location (including street and city if local pharmacy) is medication to be sent to?  [facility] [place], [address]  3. Do they need a 30 day or 90 day supply? 90   Pt made an appt 12/11 with Arlington Calix, NP, pt requesting 90 day supply

## 2022-01-28 NOTE — Telephone Encounter (Signed)
Patient walked in. He needs a refill on his atorvastatin 80mg  at [facility] in [place]. Called pharmacy and they asked him to call us.

## 2022-02-04 DIAGNOSIS — Z23 Encounter for immunization: Secondary | ICD-10-CM | POA: Diagnosis not present

## 2022-02-05 ENCOUNTER — Other Ambulatory Visit: Payer: Self-pay | Admitting: Cardiology

## 2022-03-06 DIAGNOSIS — Z23 Encounter for immunization: Secondary | ICD-10-CM | POA: Diagnosis not present

## 2022-03-07 ENCOUNTER — Ambulatory Visit: Payer: Medicare PPO | Attending: Internal Medicine | Admitting: Internal Medicine

## 2022-03-07 VITALS — BP 110/72 | HR 73 | Ht 74.0 in | Wt 233.2 lb

## 2022-03-07 DIAGNOSIS — I4891 Unspecified atrial fibrillation: Secondary | ICD-10-CM | POA: Diagnosis not present

## 2022-03-07 NOTE — Patient Instructions (Signed)
Medication Instructions:  Your physician recommends that you continue on your current medications as directed. Please refer to the Current Medication list given to you today.  *If you need a refill on your cardiac medications before your next appointment, please call your pharmacy*   Lab Work: NONE   If you have labs (blood work) drawn today and your tests are completely normal, you will receive your results only by: MyChart Message (if you have MyChart) OR A paper copy in the mail If you have any lab test that is abnormal or we need to change your treatment, we will call you to review the results.   Testing/Procedures: NONE    Follow-Up: At [facility], you and your health needs are our priority.  As part of our continuing mission to provide you with exceptional heart care, we have created designated Provider Care Teams.  These Care Teams include your primary Cardiologist (physician) and Advanced Practice Providers (APPs -  Physician Assistants and Nurse Practitioners) who all work together to provide you with the care you need, when you need it.  We recommend signing up for the patient portal called "MyChart".  Sign up information is provided on this After Visit Summary.  MyChart is used to connect with patients for Virtual Visits (Telemedicine).  Patients are able to view lab/test results, encounter notes, upcoming appointments, etc.  Non-urgent messages can be sent to your provider as well.   To learn more about what you can do with MyChart, go to [email or web address].    Your next appointment:   1 year(s)  The format for your next appointment:   In Person  Provider:   Gregg Taylor, MD    Other Instructions Thank you for choosing [facility]!    Important Information About Sugar       

## 2022-03-07 NOTE — Progress Notes (Signed)
HPI [name] returns today for followup. He is a pleasant 71 yo man with a h/o chronic systolic heart failure and persistent atrial fib, s/p DCCV. He has a remote h/o MI, s/p CABG. He has HTN. In the interim, he notes no palpitations, chest pain or sob or edema. No syncope.  Allergies  Allergen Reactions   Acetaminophen Other (See Comments)    Hallucinations     Current Outpatient Medications  Medication Sig Dispense Refill   atorvastatin (LIPITOR) 80 MG tablet Take 1 tablet (80 mg total) by mouth daily. 60 tablet 0   clonazePAM (KLONOPIN) 0.5 MG tablet Take 0.5 mg by mouth at bedtime as needed for anxiety.     ENTRESTO 97-103 MG TAKE 1 TABLET BY MOUTH TWICE DAILY 120 tablet 3   metoprolol (TOPROL-XL) 200 MG 24 hr tablet Take 1 tablet (200 mg total) by mouth daily. 90 tablet 0   Multiple Vitamin (MULTIVITAMIN) tablet Take 1 tablet by mouth daily.      nitroGLYCERIN (NITROSTAT) 0.4 MG SL tablet Place 0.4 mg under the tongue every 5 (five) minutes as needed for chest pain.     pantoprazole (PROTONIX) 40 MG tablet TAKE 1 TABLET BY MOUTH EVERY DAY 90 tablet 0   rivaroxaban (XARELTO) 20 MG TABS tablet TAKE 1 TABLET BY MOUTH EVERY DAY 90 tablet 0   sildenafil (REVATIO) 20 MG tablet Take 20-100 mg by mouth as needed (30 minutes prior to sexual activity).   6   spironolactone (ALDACTONE) 25 MG tablet TAKE 1/2 TABLET BY MOUTH EVERY DAY 45 tablet 1   No current facility-administered medications for this visit.     Past Medical History:  Diagnosis Date   AF (atrial fibrillation) ([place]) 12/29/2017   CAD in native artery    08/05/16 Cath, atresia of LIMA to LAD, occlusion of SVG-->RCA, SVG-->Circumflex/OM2, SVG-->OM1, patent SVG to diag, DES to native circumflex.    Erectile dysfunction    Hyperlipidemia, mixed    Myocardial infarction [facility]) 2001   Out of hospital myocardial infarction/inferior wall, status post Cardiolite study 2007.  Ejection fraction 45%   Pacemaker 12/2017    Unspecified essential hypertension     ROS:   All systems reviewed and negative except as noted in the HPI.   Past Surgical History:  Procedure Laterality Date   BIV PACEMAKER INSERTION CRT-P N/A 12/25/2017   Procedure: BIV PACEMAKER INSERTION CRT-P;  Surgeon: Evans Lance, MD;  Location: [place] CV LAB;  Service: Cardiovascular;  Laterality: N/A;   CARDIOVERSION N/A 02/04/2018   Procedure: CARDIOVERSION;  Surgeon: Arnoldo Lenis, MD;  Location: [facility];  Service: Endoscopy;  Laterality: N/A;   CATARACT EXTRACTION     CORONARY ARTERY BYPASS GRAFT  2001   2001.  LIMA to LAD, SVG to OM1, SVG to OM 2, SVG to diagonal, SVG to PDA.   CORONARY BALLOON ANGIOPLASTY N/A 08/05/2016   Procedure: Coronary Balloon Angioplasty;  Surgeon: Jettie Booze, MD;  Location: [place] CV LAB;  Service: Cardiovascular;  Laterality: N/A;  CFX   CORONARY STENT INTERVENTION N/A 08/05/2016   Procedure: Coronary Stent Intervention;  Surgeon: Jettie Booze, MD;  Location: [place] CV LAB;  Service: Cardiovascular;  Laterality: N/A;  OM   LAPAROSCOPIC CHOLECYSTECTOMY  06/22/2013   Dr. Truddie Hidden   LEFT HEART CATH AND CORS/GRAFTS ANGIOGRAPHY N/A 08/05/2016   Procedure: Left Heart Cath and Cors/Grafts Angiography;  Surgeon: Jettie Booze, MD;  Location: [place] CV LAB;  Service: Cardiovascular;  Laterality: N/A;     Family History  Problem Relation Age of Onset   Alzheimer's disease Mother    Brain cancer Mother        brain tumor   Stroke Father    Alzheimer's disease Father      Social History   Socioeconomic History   Marital status: Married    Spouse name: Not on file   Number of children: Not on file   Years of education: Not on file   Highest education level: Not on file  Occupational History   Occupation: Full time    Employer: [employer]  Tobacco Use   Smoking status: Never   Smokeless tobacco: Never  Vaping Use   Vaping Use: Never used  Substance and  Sexual Activity   Alcohol use: Yes    Alcohol/week: 0.0 standard drinks of alcohol    Comment: RARE   Drug use: No   Sexual activity: Not on file  Other Topics Concern   Not on file  Social History Narrative   Not on file   Social Determinants of Health   Financial Resource Strain: Not on file  Food Insecurity: Not on file  Transportation Needs: Not on file  Physical Activity: Not on file  Stress: Not on file  Social Connections: Not on file  Intimate Partner Violence: Not on file     There were no vitals taken for this visit.  Physical Exam:  Well appearing NAD HEENT: Unremarkable Neck:  No JVD, no thyromegally Lymphatics:  No adenopathy Back:  No CVA tenderness Lungs:  Clear HEART:  Regular rate rhythm, no murmurs, no rubs, no clicks Abd:  soft, positive bowel sounds, no organomegally, no rebound, no guarding Ext:  2 plus pulses, no edema, no cyanosis, no clubbing Skin:  No rashes no nodules Neuro:  CN II through XII intact, motor grossly intact  EKG - atrial fib with ventricular pacing  DEVICE  Normal device function.  See PaceArt for details.   Assess/Plan: 1. Atrial fib - he is out of rhythm now 100% of the time. He is asymptomatic. 2. CHB - he is pacing 99% and has no escape today. He is asymptomatic. 3. CAD - he remains active but denies anginal symptoms. No change in meds. 4. coags - he has not had any bleeding on Xarelto.  5. PPM -his St. Jude biv PPM is working normally with over 3.5 years of battery longevity.   Sharlot Gowda Shamyia Grandpre,MD

## 2022-03-13 ENCOUNTER — Ambulatory Visit (INDEPENDENT_AMBULATORY_CARE_PROVIDER_SITE_OTHER): Payer: Medicare PPO

## 2022-03-13 DIAGNOSIS — I442 Atrioventricular block, complete: Secondary | ICD-10-CM | POA: Diagnosis not present

## 2022-03-13 LAB — CUP PACEART REMOTE DEVICE CHECK
Battery Remaining Longevity: 41 mo
Battery Remaining Percentage: 41 %
Battery Voltage: 2.95 V
Brady Statistic AP VP Percent: 0 %
Brady Statistic AP VS Percent: 0 %
Brady Statistic AS VP Percent: 0 %
Brady Statistic AS VS Percent: 0 %
Brady Statistic RA Percent Paced: 0 %
Date Time Interrogation Session: 20231108050200
Implantable Lead Connection Status: 753985
Implantable Lead Connection Status: 753985
Implantable Lead Connection Status: 753985
Implantable Lead Implant Date: 20190822
Implantable Lead Implant Date: 20190822
Implantable Lead Implant Date: 20190822
Implantable Lead Location: 753858
Implantable Lead Location: 753859
Implantable Lead Location: 753860
Implantable Pulse Generator Implant Date: 20190822
Lead Channel Impedance Value: 540 Ohm
Lead Channel Impedance Value: 560 Ohm
Lead Channel Impedance Value: 630 Ohm
Lead Channel Pacing Threshold Amplitude: 0.625 V
Lead Channel Pacing Threshold Amplitude: 0.75 V
Lead Channel Pacing Threshold Pulse Width: 0.5 ms
Lead Channel Pacing Threshold Pulse Width: 0.5 ms
Lead Channel Sensing Intrinsic Amplitude: 2.2 mV
Lead Channel Sensing Intrinsic Amplitude: 6.5 mV
Lead Channel Setting Pacing Amplitude: 2 V
Lead Channel Setting Pacing Amplitude: 2 V
Lead Channel Setting Pacing Amplitude: 2.5 V
Lead Channel Setting Pacing Pulse Width: 0.5 ms
Lead Channel Setting Pacing Pulse Width: 0.5 ms
Lead Channel Setting Sensing Sensitivity: 4 mV
Pulse Gen Model: 3562
Pulse Gen Serial Number: 9056721

## 2022-03-20 ENCOUNTER — Other Ambulatory Visit: Payer: Self-pay | Admitting: Cardiology

## 2022-03-20 NOTE — Telephone Encounter (Signed)
Prescription refill request for Xarelto received.  Indication: AF Last office visit: 03/07/22  Rosette Reveal MD Weight: 105.8kg Age: 71 Scr: 1.17 on 06/27/21 CrCl: 89.17  Based on above findings Xarelto 20mg  daily is the appropriate dose.  Refill approved.

## 2022-03-25 NOTE — Progress Notes (Signed)
Remote pacemaker transmission.   

## 2022-04-13 NOTE — Progress Notes (Unsigned)
Cardiology Office Note:    Date:  04/13/2022   ID:  Michael Melton, DOB 04-22-1951, MRN [id number]  PCP:  Michael Pandy, MD   [facility] Providers Cardiologist:  Michael Rich, MD Electrophysiologist:  Michael Bunting, MD { Click to update primary MD,subspecialty MD or APP then REFRESH:1}    Referring MD: Michael Pandy, MD   No chief complaint on file. ***  History of Present Illness:    Michael Melton is a 71 y.o. male with a hx of ***  Past Medical History:  Diagnosis Date   AF (atrial fibrillation) (HCC) 12/29/2017   CAD in native artery    08/05/16 Cath, atresia of LIMA to LAD, occlusion of SVG-->RCA, SVG-->Circumflex/OM2, SVG-->OM1, patent SVG to diag, DES to native circumflex.    Erectile dysfunction    Hyperlipidemia, mixed    Myocardial infarction [facility]) 2001   Out of hospital myocardial infarction/inferior wall, status post Cardiolite study 2007.  Ejection fraction 45%   Pacemaker 12/2017   Unspecified essential hypertension     Past Surgical History:  Procedure Laterality Date   BIV PACEMAKER INSERTION CRT-P N/A 12/25/2017   Procedure: BIV PACEMAKER INSERTION CRT-P;  Surgeon: Michael Maw, MD;  Location: [facility] INVASIVE CV LAB;  Service: Cardiovascular;  Laterality: N/A;   CARDIOVERSION N/A 02/04/2018   Procedure: CARDIOVERSION;  Surgeon: Michael Poche, MD;  Location: [facility];  Service: Endoscopy;  Laterality: N/A;   CATARACT EXTRACTION     CORONARY ARTERY BYPASS GRAFT  2001   2001.  LIMA to LAD, SVG to OM1, SVG to OM 2, SVG to diagonal, SVG to PDA.   CORONARY BALLOON ANGIOPLASTY N/A 08/05/2016   Procedure: Coronary Balloon Angioplasty;  Surgeon: Michael Crafts, MD;  Location: [facility] INVASIVE CV LAB;  Service: Cardiovascular;  Laterality: N/A;  CFX   CORONARY STENT INTERVENTION N/A 08/05/2016   Procedure: Coronary Stent Intervention;  Surgeon: Michael Crafts, MD;  Location: [facility] INVASIVE CV LAB;  Service: Cardiovascular;  Laterality: N/A;   OM   LAPAROSCOPIC CHOLECYSTECTOMY  06/22/2013   Dr. Cristy Melton   LEFT HEART CATH AND CORS/GRAFTS ANGIOGRAPHY N/A 08/05/2016   Procedure: Left Heart Cath and Cors/Grafts Angiography;  Surgeon: Michael Crafts, MD;  Location: [facility] INVASIVE CV LAB;  Service: Cardiovascular;  Laterality: N/A;    Current Medications: No outpatient medications have been marked as taking for the 04/15/22 encounter (Appointment) with Michael Dory, NP.     Allergies:   Acetaminophen   Social History   Socioeconomic History   Marital status: Married    Spouse name: Not on file   Number of children: Not on file   Years of education: Not on file   Highest education level: Not on file  Occupational History   Occupation: Full time    Employer: [employer]  Tobacco Use   Smoking status: Never   Smokeless tobacco: Never  Vaping Use   Vaping Use: Never used  Substance and Sexual Activity   Alcohol use: Yes    Alcohol/week: 0.0 standard drinks of alcohol    Comment: RARE   Drug use: No   Sexual activity: Not on file  Other Topics Concern   Not on file  Social History Narrative   Not on file   Social Determinants of Health   Financial Resource Strain: Not on file  Food Insecurity: Not on file  Transportation Needs: Not on file  Physical Activity: Not on file  Stress: Not on file  Social  Connections: Not on file     Family History: The patient's ***family history includes Alzheimer's disease in his father and mother; Brain cancer in his mother; Stroke in his father.  ROS:   Please see the history of present illness.    *** All other systems reviewed and are negative.  EKGs/Labs/Other Studies Reviewed:    The following studies were reviewed today: ***  EKG:  EKG is *** ordered today.  The ekg ordered today demonstrates ***  Recent Labs: No results found for requested labs within last 365 days.  Recent Lipid Panel No results found for: "CHOL", "TRIG", "HDL", "CHOLHDL", "VLDL", "LDLCALC",  "LDLDIRECT"   Risk Assessment/Calculations:   {Does this patient have ATRIAL FIBRILLATION?:[phone]}  No BP recorded.  {Refresh Note OR Click here to enter BP  :1}***         Physical Exam:    VS:  There were no vitals taken for this visit.    Wt Readings from Last 3 Encounters:  03/07/22 233 lb 3.2 oz (105.8 kg)  02/02/21 230 lb (104.3 kg)  08/01/20 234 lb 6.4 oz (106.3 kg)     GEN: *** Well nourished, well developed in no acute distress HEENT: Normal NECK: No JVD; No carotid bruits LYMPHATICS: No lymphadenopathy CARDIAC: ***RRR, no murmurs, rubs, gallops RESPIRATORY:  Clear to auscultation without rales, wheezing or rhonchi  ABDOMEN: Soft, non-tender, non-distended MUSCULOSKELETAL:  No edema; No deformity  SKIN: Warm and dry NEUROLOGIC:  Alert and oriented x 3 PSYCHIATRIC:  Normal affect   ASSESSMENT:    No diagnosis found. PLAN:    In order of problems listed above:  ***      {Are you ordering a CV Procedure (e.g. stress test, cath, DCCV, TEE, etc)?   Press F2        :[id number]}    Medication Adjustments/Labs and Tests Ordered: Current medicines are reviewed at length with the patient today.  Concerns regarding medicines are outlined above.  No orders of the defined types were placed in this encounter.  No orders of the defined types were placed in this encounter.   There are no Patient Instructions on file for this visit.   SignedSharlene Dory, NP  04/13/2022 12:06 PM    [facility]

## 2022-04-15 ENCOUNTER — Encounter: Payer: Self-pay | Admitting: Nurse Practitioner

## 2022-04-15 ENCOUNTER — Ambulatory Visit: Payer: Medicare PPO | Attending: Nurse Practitioner | Admitting: Nurse Practitioner

## 2022-04-15 ENCOUNTER — Other Ambulatory Visit: Payer: Self-pay | Admitting: Cardiology

## 2022-04-15 VITALS — BP 128/84 | HR 70 | Ht 74.0 in | Wt 231.2 lb

## 2022-04-15 DIAGNOSIS — E782 Mixed hyperlipidemia: Secondary | ICD-10-CM

## 2022-04-15 DIAGNOSIS — I442 Atrioventricular block, complete: Secondary | ICD-10-CM

## 2022-04-15 DIAGNOSIS — Z950 Presence of cardiac pacemaker: Secondary | ICD-10-CM

## 2022-04-15 DIAGNOSIS — I4811 Longstanding persistent atrial fibrillation: Secondary | ICD-10-CM | POA: Diagnosis not present

## 2022-04-15 DIAGNOSIS — I5022 Chronic systolic (congestive) heart failure: Secondary | ICD-10-CM

## 2022-04-15 DIAGNOSIS — I2510 Atherosclerotic heart disease of native coronary artery without angina pectoris: Secondary | ICD-10-CM | POA: Diagnosis not present

## 2022-04-15 DIAGNOSIS — I10 Essential (primary) hypertension: Secondary | ICD-10-CM

## 2022-04-15 DIAGNOSIS — Z79899 Other long term (current) drug therapy: Secondary | ICD-10-CM | POA: Diagnosis not present

## 2022-04-15 NOTE — Patient Instructions (Addendum)
Medication Instructions:  Continue all current medications.  Labwork: CMET, FLP, CBC - orders given today  Reminder:  Nothing to eat or drink after 12 midnight prior to labs. Office will contact with results via phone, letter or mychart.     Testing/Procedures: Your physician has requested that you have an echocardiogram. Echocardiography is a painless test that uses sound waves to create images of your heart. It provides your doctor with information about the size and shape of your heart and how well your heart's chambers and valves are working. This procedure takes approximately one hour. There are no restrictions for this procedure. Please do NOT wear cologne, perfume, aftershave, or lotions (deodorant is allowed). Please arrive 15 minutes prior to your appointment time.  Office will contact with results via phone, letter or mychart.     Follow-Up: 5-6 weeks  Any Other Special Instructions Will Be Listed Below (If Applicable).   If you need a refill on your cardiac medications before your next appointment, please call your pharmacy.

## 2022-04-18 DIAGNOSIS — Z79899 Other long term (current) drug therapy: Secondary | ICD-10-CM | POA: Diagnosis not present

## 2022-04-21 ENCOUNTER — Encounter: Payer: Self-pay | Admitting: Nurse Practitioner

## 2022-04-21 NOTE — Telephone Encounter (Signed)
This encounter was created in error - please disregard.

## 2022-04-23 ENCOUNTER — Ambulatory Visit: Payer: Medicare PPO | Attending: Nurse Practitioner

## 2022-04-23 DIAGNOSIS — I5022 Chronic systolic (congestive) heart failure: Secondary | ICD-10-CM | POA: Diagnosis not present

## 2022-04-23 LAB — ECHOCARDIOGRAM COMPLETE
AR max vel: 2.66 cm2
AV Area VTI: 2.77 cm2
AV Area mean vel: 2.72 cm2
AV Mean grad: 2.4 mmHg
AV Peak grad: 4.4 mmHg
Ao pk vel: 1.04 m/s
Area-P 1/2: 3.74 cm2
Calc EF: 44.1 %
MV M vel: 3.71 m/s
MV Peak grad: 55.2 mmHg
P 1/2 time: 3617 msec
S' Lateral: 4.4 cm
Single Plane A2C EF: 43.1 %
Single Plane A4C EF: 43.8 %

## 2022-05-04 ENCOUNTER — Other Ambulatory Visit: Payer: Self-pay | Admitting: Cardiology

## 2022-05-18 ENCOUNTER — Other Ambulatory Visit: Payer: Self-pay | Admitting: Cardiology

## 2022-05-20 ENCOUNTER — Telehealth: Payer: Self-pay | Admitting: *Deleted

## 2022-05-20 NOTE — Telephone Encounter (Signed)
  Laurine Blazer, LPN 2/56/3893 73:42 AM EST Back to Top    Notified, copy to pcp.

## 2022-05-20 NOTE — Telephone Encounter (Signed)
-----  Message from Merlene Laughter, RN sent at 05/16/2022  7:43 AM EST -----  ----- Message ----- From: Finis Bud, NP Sent: 05/15/2022   4:14 PM EST To: Merlene Laughter, RN  Heart pumping function continues to remain minimally reduced at 40 to 45%, similar to echocardiogram done in 2020. Rest of echocardiogram was unremarkable.  I will see him back for follow-up next week and plan to discuss the importance of starting Farxiga to help improve this EF.  I will go over the results with him at next visit.  Thank so much!  Finis Bud, AGNP-C

## 2022-05-21 ENCOUNTER — Encounter: Payer: Self-pay | Admitting: Nurse Practitioner

## 2022-05-21 ENCOUNTER — Ambulatory Visit: Payer: Medicare PPO | Attending: Nurse Practitioner | Admitting: Nurse Practitioner

## 2022-05-21 VITALS — BP 114/70 | HR 70 | Ht 74.0 in | Wt 231.0 lb

## 2022-05-21 DIAGNOSIS — E785 Hyperlipidemia, unspecified: Secondary | ICD-10-CM

## 2022-05-21 DIAGNOSIS — Z79899 Other long term (current) drug therapy: Secondary | ICD-10-CM | POA: Diagnosis not present

## 2022-05-21 DIAGNOSIS — Z7901 Long term (current) use of anticoagulants: Secondary | ICD-10-CM

## 2022-05-21 DIAGNOSIS — I2510 Atherosclerotic heart disease of native coronary artery without angina pectoris: Secondary | ICD-10-CM | POA: Diagnosis not present

## 2022-05-21 DIAGNOSIS — I5022 Chronic systolic (congestive) heart failure: Secondary | ICD-10-CM | POA: Diagnosis not present

## 2022-05-21 DIAGNOSIS — Z950 Presence of cardiac pacemaker: Secondary | ICD-10-CM

## 2022-05-21 DIAGNOSIS — I442 Atrioventricular block, complete: Secondary | ICD-10-CM | POA: Diagnosis not present

## 2022-05-21 DIAGNOSIS — I10 Essential (primary) hypertension: Secondary | ICD-10-CM

## 2022-05-21 DIAGNOSIS — I4811 Longstanding persistent atrial fibrillation: Secondary | ICD-10-CM | POA: Diagnosis not present

## 2022-05-21 MED ORDER — METOPROLOL SUCCINATE ER 200 MG PO TB24: ORAL_TABLET | ORAL | 3 refills | Status: DC

## 2022-05-21 MED ORDER — DAPAGLIFLOZIN PROPANEDIOL 10 MG PO TABS: 10.0000 mg | ORAL_TABLET | Freq: Every day | ORAL | 3 refills | Status: DC

## 2022-05-21 NOTE — Patient Instructions (Addendum)
Medication Instructions:  START Farxiga 10 mg dai y   *If you need a refill on your cardiac medications before your next appointment, please call your pharmacy*  Lab Work: Your physician recommends that you return for lab work in 2 weeks:  BMP If you have labs (blood work) drawn today and your tests are completely normal, you will receive your results only by: [place] (if you have MyChart) OR A paper copy in the mail If you have any lab test that is abnormal or we need to change your treatment, we will call you to review the results.   Testing/Procedures: NONE ordered at this time of appointment   Follow-Up: At [facility], you and your health needs are our priority.  As part of our continuing mission to provide you with exceptional heart care, we have created designated Provider Care Teams.  These Care Teams include your primary Cardiologist (physician) and Advanced Practice Providers (APPs -  Physician Assistants and Nurse Practitioners) who all work together to provide you with the care you need, when you need it.  We recommend signing up for the patient portal called "MyChart".  Sign up information is provided on this After Visit Summary.  MyChart is used to connect with patients for Virtual Visits (Telemedicine).  Patients are able to view lab/test results, encounter notes, upcoming appointments, etc.  Non-urgent messages can be sent to your provider as well.   To learn more about what you can do with MyChart, go to [email or web address].    Your next appointment:   2 month(s)  Provider:   Finis Bud, NP  Then, Carlyle Dolly, MD will plan to see you again in 6 month(s).    Other Instructions

## 2022-05-21 NOTE — Progress Notes (Signed)
Cardiology Office Note:    Date:  05/21/2022   ID:  Michael Melton, DOB Oct 29, 1950, MRN [id number]  PCP:  Manon Hilding, MD   [place] [facility] Cardiologist:  Carlyle Dolly, MD Electrophysiologist:  Cristopher Peru, MD      Referring MD: Manon Hilding, MD   CC: Here for follow-up  History of Present Illness:    Michael Melton is a 72 y.o. male with a hx of the following:  CAD, s/p MI and s/p CABG, hx of DES to OM1 with angioplasty to LCx HTN HLD Permanent A-fib (Dx 2019), s/p DCCV in 2019 -> returned to A-fib Chronic systolic CHF CHB s/p Bi-V PPM  Patient is a very pleasant 72 year old male with past medical history as mentioned above.  Previous cardiovascular history includes CABG in 2001 at [facility], negative Myoview in 2014.  Echocardiogram at that time revealed EF 50%, grade 1 DD.  Stress test in February '18 was intermediate risk with mild peri-infarct ischemia noted, EF 40%.  Repeat echo in 2018 revealed EF of 40%, grade 1 DD.  Underwent heart catheterization in April 2018 that showed occluded mid LAD, RCA 100%, mid left circumflex 80%, OM2 99%, OM1 80%, and occlusion to RIMA-OM1, SVG-OM2, with patent SVG-D2, and mid portion occlusion to LIMA-LAD.  He received angioplasty to left circumflex, DES received to OM1, and was recommended to receive Plavix indefinitely.  Echocardiogram the following year revealed EF 25 to 30%, diffuse hypokinesis with mild MR, grade 2 DD.  EF improved to 40 to 45% in June 2020.   Last seen by Dr. Carlyle Dolly on February 02, 2021. Denied any CP or palpitations. Compliant with his medications. Discussed starting Farxiga; however, pt stated he wanted to think about it some more. No change in medication therapy and was told to follow-up in 6 months.   I saw this patient for an overdue follow up visit on 04/15/2022. Was doing well at the time and denied any acute cardiac complaints or concerns.  Weight is stable.  Denies any  other questions or concerns today. Updated Echo revealed EF still mildly reduced at 40-45%, global hypokinesis of left ventricle. Left atrial size was mildly dilated, mild MR, no other significant valvular abnormalities. Was told to follow up in 5-6 weeks.   Today he presents for follow-up. He is doing well today and denies any changes to his health or any cardiac complaints or concerns. Denies any chest pain, shortness of breath, palpitations, syncope, presyncope, dizziness, orthopnea, PND, swelling or significant weight changes, acute bleeding, or claudication. He and his wife love to travel and he plays drums and keyboard in his free time. Denies any other questions or concerns.    Past Medical History:  Diagnosis Date   AF (atrial fibrillation) ([place]) 12/29/2017   CAD in native artery    08/05/16 Cath, atresia of LIMA to LAD, occlusion of SVG-->RCA, SVG-->Circumflex/OM2, SVG-->OM1, patent SVG to diag, DES to native circumflex.    Erectile dysfunction    Hyperlipidemia, mixed    Myocardial infarction [facility]) 2001   Out of hospital myocardial infarction/inferior wall, status post Cardiolite study 2007.  Ejection fraction 45%   Pacemaker 12/2017   Unspecified essential hypertension     Past Surgical History:  Procedure Laterality Date   BIV PACEMAKER INSERTION CRT-P N/A 12/25/2017   Procedure: BIV PACEMAKER INSERTION CRT-P;  Surgeon: Evans Lance, MD;  Location: [place] CV LAB;  Service: Cardiovascular;  Laterality: N/A;   CARDIOVERSION N/A  02/04/2018   Procedure: CARDIOVERSION;  Surgeon: Antoine Poche, MD;  Location: [facility];  Service: Endoscopy;  Laterality: N/A;   CATARACT EXTRACTION     CORONARY ARTERY BYPASS GRAFT  2001   2001.  LIMA to LAD, SVG to OM1, SVG to OM 2, SVG to diagonal, SVG to PDA.   CORONARY BALLOON ANGIOPLASTY N/A 08/05/2016   Procedure: Coronary Balloon Angioplasty;  Surgeon: Corky Crafts, MD;  Location: [facility] INVASIVE CV LAB;  Service: Cardiovascular;   Laterality: N/A;  CFX   CORONARY STENT INTERVENTION N/A 08/05/2016   Procedure: Coronary Stent Intervention;  Surgeon: Corky Crafts, MD;  Location: [facility] INVASIVE CV LAB;  Service: Cardiovascular;  Laterality: N/A;  OM   LAPAROSCOPIC CHOLECYSTECTOMY  06/22/2013   Dr. Cristy Folks   LEFT HEART CATH AND CORS/GRAFTS ANGIOGRAPHY N/A 08/05/2016   Procedure: Left Heart Cath and Cors/Grafts Angiography;  Surgeon: Corky Crafts, MD;  Location: [facility] INVASIVE CV LAB;  Service: Cardiovascular;  Laterality: N/A;    Current Medications: Current Meds  Medication Sig   atorvastatin (LIPITOR) 80 MG tablet TAKE 1 TABLET(80 MG) BY MOUTH DAILY   clonazePAM (KLONOPIN) 0.5 MG tablet Take 0.5 mg by mouth at bedtime as needed for anxiety.   ENTRESTO 97-103 MG TAKE 1 TABLET BY MOUTH TWICE DAILY   nitroGLYCERIN (NITROSTAT) 0.4 MG SL tablet Place 0.4 mg under the tongue every 5 (five) minutes as needed for chest pain.   pantoprazole (PROTONIX) 40 MG tablet TAKE 1 TABLET BY MOUTH EVERY DAY   rivaroxaban (XARELTO) 20 MG TABS tablet TAKE 1 TABLET BY MOUTH EVERY DAY   sildenafil (REVATIO) 20 MG tablet Take 20-100 mg by mouth as needed (30 minutes prior to sexual activity).    spironolactone (ALDACTONE) 25 MG tablet TAKE 1/2 TABLET BY MOUTH EVERY DAY    metoprolol (TOPROL-XL) 200 MG 24 hr tablet TAKE 1 TABLET(200 MG) BY MOUTH DAILY     Allergies:   Acetaminophen   Social History   Socioeconomic History   Marital status: Married    Spouse name: Not on file   Number of children: Not on file   Years of education: Not on file   Highest education level: Not on file  Occupational History   Occupation: Full time    Employer: [employer]  Tobacco Use   Smoking status: Never   Smokeless tobacco: Never  Vaping Use   Vaping Use: Never used  Substance and Sexual Activity   Alcohol use: Yes    Alcohol/week: 0.0 standard drinks of alcohol    Comment: RARE   Drug use: No   Sexual activity: Not on file  Other Topics  Concern   Not on file  Social History Narrative   Not on file   Social Determinants of Health   Financial Resource Strain: Not on file  Food Insecurity: Not on file  Transportation Needs: Not on file  Physical Activity: Not on file  Stress: Not on file  Social Connections: Not on file     Family History: The patient's family history includes Alzheimer's disease in his father and mother; Brain cancer in his mother; Stroke in his father.  ROS:   Review of Systems  Constitutional: Negative.   HENT: Negative.    Eyes: Negative.   Respiratory: Negative.    Cardiovascular: Negative.   Gastrointestinal: Negative.   Genitourinary: Negative.   Musculoskeletal: Negative.   Skin: Negative.   Neurological: Negative.   Endo/Heme/Allergies: Negative.   Psychiatric/Behavioral: Negative.  Please see the history of present illness.    All other systems reviewed and are negative.  EKGs/Labs/Other Studies Reviewed:    The following studies were reviewed today:   EKG:  EKG is not ordered today.  Echocardiogram on 04/23/2022:  1. Left ventricular ejection fraction, by estimation, is 40 to 45%. The  left ventricle has mildly decreased function. The left ventricle  demonstrates global hypokinesis. Left ventricular diastolic parameters are  indeterminate. The average left  ventricular global longitudinal strain is -14.8 %. The global longitudinal  strain is abnormal.   2. Right ventricular systolic function was not well visualized. The right  ventricular size is not well visualized. Tricuspid regurgitation signal is  inadequate for assessing PA pressure.   3. Left atrial size was mildly dilated.   4. The mitral valve is abnormal. Mild mitral valve regurgitation. No  evidence of mitral stenosis.   5. The tricuspid valve is abnormal.   6. The aortic valve is tricuspid. Aortic valve regurgitation is not  visualized. No aortic stenosis is present.   Comparison(s): Echocardiogram  done 10/21/18 showed an EF of 40-455.   Echocardiogram on October 21, 2018:  1. The left ventricle has mild-moderately reduced systolic function, with  an ejection fraction of 40-45%. The cavity size was normal. There is  mildly increased left ventricular wall thickness. Left ventricular  diastolic Doppler parameters are  indeterminate.   2. Left atrial size was severely dilated.   3. Right atrial size was moderately dilated.   4. The aortic valve is tricuspid. Mild thickening of the aortic valve.  Mild calcification of the aortic valve. No stenosis of the aortic valve.  Mild aortic annular calcification noted.   5. The mitral valve is abnormal. Mild thickening of the mitral valve  leaflet. Mild calcification of the mitral valve leaflet. There is mild  mitral annular calcification present. No evidence of mitral valve  stenosis.   6. The aortic root is normal in size and structure.   7. Pulmonary hypertension is indeterminant, inadequate TR jet.   8. The interatrial septum was not well visualized.  Left heart cath on August 05, 2016: Severe native three-vessel coronary artery disease. Mid RCA lesion, 100 %stenosed. SVG to PDA is occluded. There are right to right and left to right collaterals. Mid LAD lesion, 100 %stenosed. LIMA to LAD is atretic. LAD fills from flow from the SVG to diagonal filling the LAD. Ost 2nd Mrg to 2nd Mrg lesion, 99 %stenosed. SVG to OM2 is occluded. Ost 1st Mrg to 1st Mrg lesion, 80 %stenosed. Free RIMA graft to OM 1 is occluded. A STENT PROMUS PREM MR 3.5X28 drug eluting stent was successfully placed., postdilated to > 66mm. Post intervention, there is a 0% residual stenosis. Mid Cx lesion, 80 %stenosed. THis was treated with initial cutting balloon angioplasty with a 2.5 mm balloon prior to stent placement in the OM. Then after stent placement, PTCA was done with a 2.0 balloon. Post intervention, there is a 10% residual stenosis. The left ventricular ejection  fraction is 25-35% by visual estimate. LV end diastolic pressure is moderately elevated. There is moderate to severe left ventricular systolic dysfunction. There is no aortic valve stenosis.   Severe coronary artery disease. Severe graft disease. Only patent graft is the SVG to diagonal which feeds both the diagonal and the LAD. Successful stenting of a large OM1 and balloon angioplasty of the mid circumflex. The RCA territory fills by right to right and left to right collaterals. Continue  aggressive secondary prevention. He'll need dual antiplatelet therapy for at least a year. Consider clopidogrel longer-term given the extent of his disease.  Myoview on June 14, 2016: ECG nondiagnostic due to IVCD. No arrhythmias noted during exercise. Blood pressure demonstrated a hypertensive response to exercise. Medium, moderate intensity, inferior/inferolateral defect extended from apex to base. There is a partially reversible zone in the inferolateral wall at the base. Most suggestive of scar with mild peri-infarct ischemia. This is an intermediate risk study. Nuclear stress EF: 40%. Consider confirmation with echocardiography.  Recent Labs: No results found for requested labs within last 365 days.  Recent Lipid Panel No results found for: "CHOL", "TRIG", "HDL", "CHOLHDL", "VLDL", "LDLCALC", "LDLDIRECT"   Risk Assessment/Calculations:    CHA2DS2-VASc Score = 4 This indicates a 4.8% annual risk of stroke. The patient's score is based upon: CHF History: 1 HTN History: 1 Diabetes History: 0 Stroke History: 0 Vascular Disease History: 1 Age Score: 1 Gender Score: 0    Physical Exam:    VS:  BP 114/70   Pulse 70   Ht 6\' 2"  (1.88 m)   Wt 231 lb (104.8 kg)   SpO2 94%   BMI 29.66 kg/m     Wt Readings from Last 3 Encounters:  05/21/22 231 lb (104.8 kg)  04/15/22 231 lb 3.2 oz (104.9 kg)  03/07/22 233 lb 3.2 oz (105.8 kg)     GEN: Well nourished, well developed 72 year old male in  no acute distress HEENT: Normal NECK: No JVD; No carotid bruits CARDIAC: S1/S2, RRR, no murmurs, rubs, gallops; 2+ peripheral pulses throughout, strong and equal bilaterally RESPIRATORY:  Clear and diminished to auscultation without rales, wheezing or rhonchi  MUSCULOSKELETAL:  No edema; No deformity  SKIN: Warm and dry NEUROLOGIC:  Alert and oriented x 3 PSYCHIATRIC:  Normal, pleasant affect   ASSESSMENT:    1. Chronic systolic heart failure ([place])   2. Medication management   3. Coronary artery disease involving native heart without angina pectoris, unspecified vessel or lesion type   4. Hyperlipidemia, unspecified hyperlipidemia type   5. Essential hypertension, benign   6. Longstanding persistent atrial fibrillation ([facility])   7. Current use of long term anticoagulation   8. Complete heart block ([place])   9. Status post biventricular pacemaker    PLAN:    In order of problems listed above:  Chronic systolic CHF, medication management Echo in 2020 revealed EF of 40 to 45%, with similar EF in 04/2022. Euvolemic and well compensated on exam. Continue Toprol-XL, Entresto, and spironolactone. Will start Farxiga 10 mg daily at this time. Will obtain BMET in 2 weeks. Low sodium diet, fluid restriction <2L, and daily weights encouraged. Educated to contact our office for weight gain of 2 lbs overnight or 5 lbs in one week. Heart healthy diet and regular cardiovascular exercise encouraged.   CAD, status post MI status post CABG, hx of DES to OM1 with angioplasty to LCx Stable with no anginal symptoms. No indication for ischemic evaluation.  Continue Lipitor, Toprol-XL, and nitroglycerin as needed. Discussed not to take sildenafil and nitroglycerin within 48 hours of one another. Heart healthy diet and regular cardiovascular exercise encouraged. Will refill Metoprolol and Nitroglycerin per his request.   Hyperlipidemia Recent labs revealed total cholesterol 126, triglyceride 96, HDL 35, and LDL  72.  Goal LDL < 70. Continue current medication regimen, consider Zetia in the future if lifestyle changes do not improve this. Heart healthy diet and regular cardiovascular exercise encouraged.   Hypertension Blood  pressure today 114/70.  BP well-controlled at home.  Continue Toprol-XL, Entresto, and spironolactone. Discussed to monitor BP at home at least 2 hours after medications and sitting for 5-10 minutes. Heart healthy diet and regular cardiovascular exercise encouraged.   Permanent A-fib, long term anticoagulation Last remote device check revealed permanent A-fib.  Dr. Lovena Le who reviewed this recommended no changes.  Creatinine clearance 78.   Denies an bleeding issues while on Xarelto.  CHA2DS2-VASc score 4.  Continue Xarelto 20 mg daily.  Continue Toprol-XL.  Continue to follow-up with the EP.   Complete heart block, status post BiV PPM Follows Dr. Lovena Le.  No changes recommended during last remote device check 2 months ago.  Continue current medication regimen.  Continue to follow-up with EP.  7.  Disposition: Follow-up with me in 2 months or sooner if anything changes. Consider starting patient assistance if needed after beginning of year depending on patient's insurance.    Medication Adjustments/Labs and Tests Ordered: Current medicines are reviewed at length with the patient today.  Concerns regarding medicines are outlined above.  Orders Placed This Encounter  Procedures   Basic metabolic panel   Meds ordered this encounter  Medications   metoprolol (TOPROL-XL) 200 MG 24 hr tablet    Sig: TAKE 1 TABLET(200 MG) BY MOUTH DAILY    Dispense:  90 tablet    Refill:  3   dapagliflozin propanediol (FARXIGA) 10 MG TABS tablet    Sig: Take 1 tablet (10 mg total) by mouth daily before breakfast.    Dispense:  90 tablet    Refill:  3    Patient Instructions  Medication Instructions:  START Farxiga 10 mg dai y   *If you need a refill on your cardiac medications before your  next appointment, please call your pharmacy*  Lab Work: Your physician recommends that you return for lab work in 2 weeks:  BMP If you have labs (blood work) drawn today and your tests are completely normal, you will receive your results only by: [place] (if you have [place]) OR A paper copy in the mail If you have any lab test that is abnormal or we need to change your treatment, we will call you to review the results.   Testing/Procedures: NONE ordered at this time of appointment   Follow-Up: At [facility], you and your health needs are our priority.  As part of our continuing mission to provide you with exceptional heart care, we have created designated Provider Care Teams.  These Care Teams include your primary Cardiologist (physician) and Advanced Practice Providers (APPs -  Physician Assistants and Nurse Practitioners) who all work together to provide you with the care you need, when you need it.  We recommend signing up for the patient portal called "MyChart".  Sign up information is provided on this After Visit Summary.  MyChart is used to connect with patients for Virtual Visits (Telemedicine).  Patients are able to view lab/test results, encounter notes, upcoming appointments, etc.  Non-urgent messages can be sent to your provider as well.   To learn more about what you can do with MyChart, go to [email or web address].    Your next appointment:   2 month(s)  Provider:   Finis Bud, NP  Then, Carlyle Dolly, MD will plan to see you again in 6 month(s).    Other Instructions    Signed, Finis Bud, NP  05/21/2022 2:32 PM    [place]

## 2022-06-12 ENCOUNTER — Ambulatory Visit (INDEPENDENT_AMBULATORY_CARE_PROVIDER_SITE_OTHER): Payer: Medicare PPO

## 2022-06-12 DIAGNOSIS — I442 Atrioventricular block, complete: Secondary | ICD-10-CM | POA: Diagnosis not present

## 2022-06-12 LAB — CUP PACEART REMOTE DEVICE CHECK
Battery Remaining Longevity: 36 mo
Battery Remaining Percentage: 38 %
Battery Voltage: 2.95 V
Brady Statistic AP VP Percent: 0 %
Brady Statistic AP VS Percent: 0 %
Brady Statistic AS VP Percent: 0 %
Brady Statistic AS VS Percent: 0 %
Brady Statistic RA Percent Paced: 1 %
Date Time Interrogation Session: 20240207020010
Implantable Lead Connection Status: 753985
Implantable Lead Connection Status: 753985
Implantable Lead Connection Status: 753985
Implantable Lead Implant Date: 20190822
Implantable Lead Implant Date: 20190822
Implantable Lead Implant Date: 20190822
Implantable Lead Location: 753858
Implantable Lead Location: 753859
Implantable Lead Location: 753860
Implantable Pulse Generator Implant Date: 20190822
Lead Channel Impedance Value: 480 Ohm
Lead Channel Impedance Value: 530 Ohm
Lead Channel Impedance Value: 600 Ohm
Lead Channel Pacing Threshold Amplitude: 0.625 V
Lead Channel Pacing Threshold Amplitude: 0.625 V
Lead Channel Pacing Threshold Pulse Width: 0.5 ms
Lead Channel Pacing Threshold Pulse Width: 0.5 ms
Lead Channel Sensing Intrinsic Amplitude: 2.2 mV
Lead Channel Sensing Intrinsic Amplitude: 6.8 mV
Lead Channel Setting Pacing Amplitude: 2 V
Lead Channel Setting Pacing Amplitude: 2 V
Lead Channel Setting Pacing Amplitude: 2.5 V
Lead Channel Setting Pacing Pulse Width: 0.5 ms
Lead Channel Setting Pacing Pulse Width: 0.5 ms
Lead Channel Setting Sensing Sensitivity: 4 mV
Pulse Gen Model: 3562
Pulse Gen Serial Number: 9056721

## 2022-06-18 ENCOUNTER — Other Ambulatory Visit: Payer: Self-pay | Admitting: Cardiology

## 2022-07-04 ENCOUNTER — Encounter: Payer: Self-pay | Admitting: *Deleted

## 2022-07-09 NOTE — Progress Notes (Signed)
Remote pacemaker transmission.   

## 2022-09-05 DIAGNOSIS — C44529 Squamous cell carcinoma of skin of other part of trunk: Secondary | ICD-10-CM | POA: Diagnosis not present

## 2022-09-05 DIAGNOSIS — D485 Neoplasm of uncertain behavior of skin: Secondary | ICD-10-CM | POA: Diagnosis not present

## 2022-09-05 DIAGNOSIS — Z85828 Personal history of other malignant neoplasm of skin: Secondary | ICD-10-CM | POA: Diagnosis not present

## 2022-09-05 DIAGNOSIS — Z85820 Personal history of malignant melanoma of skin: Secondary | ICD-10-CM | POA: Diagnosis not present

## 2022-09-05 DIAGNOSIS — L820 Inflamed seborrheic keratosis: Secondary | ICD-10-CM | POA: Diagnosis not present

## 2022-09-05 DIAGNOSIS — L570 Actinic keratosis: Secondary | ICD-10-CM | POA: Diagnosis not present

## 2022-09-11 ENCOUNTER — Ambulatory Visit (INDEPENDENT_AMBULATORY_CARE_PROVIDER_SITE_OTHER): Payer: Medicare PPO

## 2022-09-11 DIAGNOSIS — I442 Atrioventricular block, complete: Secondary | ICD-10-CM

## 2022-09-11 LAB — CUP PACEART REMOTE DEVICE CHECK
Battery Remaining Longevity: 34 mo
Battery Remaining Percentage: 34 %
Battery Voltage: 2.93 V
Brady Statistic AP VP Percent: 0 %
Brady Statistic AP VS Percent: 0 %
Brady Statistic AS VP Percent: 0 %
Brady Statistic AS VS Percent: 0 %
Brady Statistic RA Percent Paced: 1 %
Date Time Interrogation Session: 20240508020010
Implantable Lead Connection Status: 753985
Implantable Lead Connection Status: 753985
Implantable Lead Connection Status: 753985
Implantable Lead Implant Date: 20190822
Implantable Lead Implant Date: 20190822
Implantable Lead Implant Date: 20190822
Implantable Lead Location: 753858
Implantable Lead Location: 753859
Implantable Lead Location: 753860
Implantable Pulse Generator Implant Date: 20190822
Lead Channel Impedance Value: 530 Ohm
Lead Channel Impedance Value: 530 Ohm
Lead Channel Impedance Value: 600 Ohm
Lead Channel Pacing Threshold Amplitude: 0.625 V
Lead Channel Pacing Threshold Amplitude: 0.625 V
Lead Channel Pacing Threshold Pulse Width: 0.5 ms
Lead Channel Pacing Threshold Pulse Width: 0.5 ms
Lead Channel Sensing Intrinsic Amplitude: 2.2 mV
Lead Channel Sensing Intrinsic Amplitude: 9.7 mV
Lead Channel Setting Pacing Amplitude: 2 V
Lead Channel Setting Pacing Amplitude: 2 V
Lead Channel Setting Pacing Amplitude: 2.5 V
Lead Channel Setting Pacing Pulse Width: 0.5 ms
Lead Channel Setting Pacing Pulse Width: 0.5 ms
Lead Channel Setting Sensing Sensitivity: 4 mV
Pulse Gen Model: 3562
Pulse Gen Serial Number: 9056721

## 2022-09-16 ENCOUNTER — Other Ambulatory Visit: Payer: Self-pay | Admitting: Cardiology

## 2022-09-16 NOTE — Telephone Encounter (Signed)
Prescription refill request for Xarelto received.  Indication: Last office visit: 05/21/22  [name] NP Weight: 104.8kg Age: 72 Scr: 1.16 on 04/18/22  Epic CrCl: 89.09  Based on above findings Xarelto 20mg  daily is the appropriate dose.  Refill approved.

## 2022-09-19 DIAGNOSIS — C44529 Squamous cell carcinoma of skin of other part of trunk: Secondary | ICD-10-CM | POA: Diagnosis not present

## 2022-10-02 NOTE — Progress Notes (Signed)
Remote pacemaker transmission.   

## 2022-10-23 ENCOUNTER — Other Ambulatory Visit: Payer: Self-pay | Admitting: Cardiology

## 2022-11-20 ENCOUNTER — Encounter: Payer: Self-pay | Admitting: Cardiology

## 2022-11-20 ENCOUNTER — Ambulatory Visit: Payer: Medicare PPO | Attending: Cardiology | Admitting: Cardiology

## 2022-11-20 VITALS — BP 118/70 | HR 69 | Ht 74.0 in | Wt 228.0 lb

## 2022-11-20 DIAGNOSIS — E782 Mixed hyperlipidemia: Secondary | ICD-10-CM

## 2022-11-20 DIAGNOSIS — I4811 Longstanding persistent atrial fibrillation: Secondary | ICD-10-CM

## 2022-11-20 DIAGNOSIS — I5022 Chronic systolic (congestive) heart failure: Secondary | ICD-10-CM | POA: Diagnosis not present

## 2022-11-20 DIAGNOSIS — I2510 Atherosclerotic heart disease of native coronary artery without angina pectoris: Secondary | ICD-10-CM

## 2022-11-20 NOTE — Progress Notes (Signed)
Clinical Summary Michael Melton is a 72 y.o.male seen today for follow up of the following medical problems.      1. CAD/ICM/Chronic systolic HF - hx of CABG in 2001, at [facility].   - GXT 09/2012 with no ischemic changes, did have frequent PVCs, 3 beat run of NSVT - echo 01/2013 LVEF 50%, grade I diastoilc dysfunction.  - echo 07/2016 LVEF 40%, grade I diastolic dysfunction - 06/2016 nuclear stress primarily scar with mild peri-infarct ischemia. LVEF 40%, intermediate risk.      - cath 08/2016: mid LAD occluded, mid LCX 80%, RCA 100%. OM 1 80%. OM2 99%. SVG-RPDA occluded, LIMA-LAD atretic and occluded in midportion, LAD fills from SVG-diag. SVG-D2 patent, RIMA-OM1 occluded, SVG-OM2 occluded. RCA territory fills by collaterals Received angiolpasty to LCX, OM1 80% receivded DES. Recs for indefinite plavix.     03/2018 echo LVEF 25-30%, diffuse hypokinesis, grade II diastoilc dysfunction, mild MR   10/2018 echo LVEF 40-45%   04/2022 echo LVEF 40-45%,    - no chest pain, no SOB/DOE. No recent edema - compliant with meds - he researched the cost of farxiga and not interested in starting at this time.      2. Symptomatic bradycardia - s/p pacemaker placement 12/2017, St Jude BiV pacemaker for complete AV block. At time of implant LVEF was 40%     09/2022 normal device check - no symptoms     3. Afib - new diagnosis during 12/2017 admission  - s/p DCCV 02/04/18. Returned back to afib. Plan for rate control strategy   - no recent palpitations - no bleeding on xarelto     4. Hyperlipidemia   02/2020 TC 123 TG 92 HDL 37 LDL 68 - he remains compliant with atorvastatin   04/2022 TC 126 TG 96 HDL 35 LDL 72 - at this time not intersted in more aggressive therapy.    5.  HTN -compliant with meds     Past Medical History:  Diagnosis Date   AF (atrial fibrillation) (HCC) 12/29/2017   CAD in native artery    08/05/16 Cath, atresia of LIMA to LAD, occlusion of SVG-->RCA,  SVG-->Circumflex/OM2, SVG-->OM1, patent SVG to diag, DES to native circumflex.    Erectile dysfunction    Hyperlipidemia, mixed    Myocardial infarction [facility]) 2001   Out of hospital myocardial infarction/inferior wall, status post Cardiolite study 2007.  Ejection fraction 45%   Pacemaker 12/2017   Unspecified essential hypertension      Allergies  Allergen Reactions   Acetaminophen Other (See Comments)    Hallucinations     Current Outpatient Medications  Medication Sig Dispense Refill   atorvastatin (LIPITOR) 80 MG tablet TAKE 1 TABLET(80 MG) BY MOUTH DAILY 60 tablet 3   clonazePAM (KLONOPIN) 0.5 MG tablet Take 0.5 mg by mouth at bedtime as needed for anxiety.     dapagliflozin propanediol (FARXIGA) 10 MG TABS tablet Take 1 tablet (10 mg total) by mouth daily before breakfast. 90 tablet 3   metoprolol (TOPROL-XL) 200 MG 24 hr tablet TAKE 1 TABLET(200 MG) BY MOUTH DAILY 90 tablet 3   Multiple Vitamin (MULTIVITAMIN) tablet Take 1 tablet by mouth daily.  (Patient not taking: Reported on 05/21/2022)     nitroGLYCERIN (NITROSTAT) 0.4 MG SL tablet Place 0.4 mg under the tongue every 5 (five) minutes as needed for chest pain.     pantoprazole (PROTONIX) 40 MG tablet TAKE 1 TABLET BY MOUTH EVERY DAY 90 tablet 1  sacubitril-valsartan (ENTRESTO) 97-103 MG TAKE 1 TABLET BY MOUTH TWICE DAILY 180 tablet 3   sildenafil (REVATIO) 20 MG tablet Take 20-100 mg by mouth as needed (30 minutes prior to sexual activity).   6   spironolactone (ALDACTONE) 25 MG tablet TAKE 1/2 TABLET BY MOUTH EVERY DAY 45 tablet 1   XARELTO 20 MG TABS tablet TAKE 1 TABLET BY MOUTH EVERY DAY 90 tablet 1   No current facility-administered medications for this visit.     Past Surgical History:  Procedure Laterality Date   BIV PACEMAKER INSERTION CRT-P N/A 12/25/2017   Procedure: BIV PACEMAKER INSERTION CRT-P;  Surgeon: Marinus Maw, MD;  Location: [facility] INVASIVE CV LAB;  Service: Cardiovascular;  Laterality: N/A;    CARDIOVERSION N/A 02/04/2018   Procedure: CARDIOVERSION;  Surgeon: Antoine Poche, MD;  [facility];  Service: Endoscopy;  Laterality: N/A;   CATARACT EXTRACTION     CORONARY ARTERY BYPASS GRAFT  2001   2001.  LIMA to LAD, SVG to OM1, SVG to OM 2, SVG to diagonal, SVG to PDA.   CORONARY BALLOON ANGIOPLASTY N/A 08/05/2016   Procedure: Coronary Balloon Angioplasty;  Surgeon: Corky Crafts, MD;  Location: [facility] INVASIVE CV LAB;  Service: Cardiovascular;  Laterality: N/A;  CFX   CORONARY STENT INTERVENTION N/A 08/05/2016   Procedure: Coronary Stent Intervention;  Surgeon: Corky Crafts, MD;  Location: [facility] INVASIVE CV LAB;  Service: Cardiovascular;  Laterality: N/A;  OM   LAPAROSCOPIC CHOLECYSTECTOMY  06/22/2013   Dr. Cristy Folks   LEFT HEART CATH AND CORS/GRAFTS ANGIOGRAPHY N/A 08/05/2016   Procedure: Left Heart Cath and Cors/Grafts Angiography;  Surgeon: Corky Crafts, MD;  Location: [facility] INVASIVE CV LAB;  Service: Cardiovascular;  Laterality: N/A;     Allergies  Allergen Reactions   Acetaminophen Other (See Comments)    Hallucinations      Family History  Problem Relation Age of Onset   Alzheimer's disease Mother    Brain cancer Mother        brain tumor   Stroke Father    Alzheimer's disease Father      Social History Michael Melton reports that he has never smoked. He has never used smokeless tobacco. Michael Melton reports current alcohol use.   Review of Systems CONSTITUTIONAL: No weight loss, fever, chills, weakness or fatigue.  HEENT: Eyes: No visual loss, blurred vision, double vision or yellow sclerae.No hearing loss, sneezing, congestion, runny nose or sore throat.  SKIN: No rash or itching.  CARDIOVASCULAR: per hpi RESPIRATORY: No shortness of breath, cough or sputum.  GASTROINTESTINAL: No anorexia, nausea, vomiting or diarrhea. No abdominal pain or blood.  GENITOURINARY: No burning on urination, no polyuria NEUROLOGICAL: No headache, dizziness,  syncope, paralysis, ataxia, numbness or tingling in the extremities. No change in bowel or bladder control.  MUSCULOSKELETAL: No muscle, back pain, joint pain or stiffness.  LYMPHATICS: No enlarged nodes. No history of splenectomy.  PSYCHIATRIC: No history of depression or anxiety.  ENDOCRINOLOGIC: No reports of sweating, cold or heat intolerance. No polyuria or polydipsia.  Marland Kitchen   Physical Examination Today's Vitals   11/20/22 0845  BP: 118/70  Pulse: 69  SpO2: 96%  Weight: 228 lb (103.4 kg)  Height: 6\' 2"  (1.88 m)   Body mass index is 29.27 kg/m.  Gen: resting comfortably, no acute distress HEENT: no scleral icterus, pupils equal round and reactive, no palptable cervical adenopathy,  CV: RRR, no m/rg ,no jvd Resp: Clear to auscultation bilaterally GI: abdomen is soft,  non-tender, non-distended, normal bowel sounds, no hepatosplenomegaly MSK: extremities are warm, no edema.  Skin: warm, no rash Neuro:  no focal deficits Psych: appropriate affect   Diagnostic Studies 01/2013 Echo Study Conclusions  - Left ventricle: Systolic function is low normal, with an estimated EF of 50-55%, but closer to 50%. There is mild global hypokinesis. The cavity size was normal. Wall thickness was increased in a pattern of mild LVH. There was an increased relative contribution of atrial contraction to ventricular filling. Doppler parameters are consistent with abnormal left ventricular relaxation (grade 1 diastolic dysfunction). - Ventricular septum: Motion consistent with post-operative changes. The contour showed diastolic flattening. - Left atrium: The atrium was mildly dilated.      09/2012 ETT Exc 6 min 50 sec, 10.1 METs. No specific ischemic changes, frequent PVCs and PACs, 3 beat run of NSVT.    06/2016 Nuclear stress test ECG nondiagnostic due to IVCD. No arrhythmias noted during exercise. Blood pressure demonstrated a hypertensive response to exercise. Medium, moderate intensity,  inferior/inferolateral defect extended from apex to base. There is a partially reversible zone in the inferolateral wall at the base. Most suggestive of scar with mild peri-infarct ischemia. This is an intermediate risk study. Nuclear stress EF: 40%. Consider confirmation with echocardiography.     07/2016 echo Study Conclusions   - Left ventricle: The cavity size was at the upper limits of   normal. Wall thickness was increased in a pattern of mild LVH.   The estimated ejection fraction was 40%. Diffuse hypokinesis.   There is akinesis of the basalinferior myocardium. Doppler   parameters are consistent with abnormal left ventricular   relaxation (grade 1 diastolic dysfunction). - Aortic valve: Mildly calcified annulus. Trileaflet. - Mitral valve: Mildly thickened leaflets . There was mild   regurgitation. - Left atrium: The atrium was mildly dilated. - Tricuspid valve: There was trivial regurgitation. - Pulmonary arteries: Systolic pressure could not be accurately   estimated. - Pericardium, extracardiac: There was no pericardial effusion.   Impressions:   - Mild LVH with upper normal chamber size and LVEF approximately   40%. There is diffuse hypokinesis with inferior basal akinesis.   Probable grade 1 diastolic dysfunction. Mild left atrial   enlargement. Mildly thickened mitral leaflets with mild mitral   regurgitation.     08/2016 cath Severe native three-vessel coronary artery disease. Mid RCA lesion, 100 %stenosed. SVG to PDA is occluded. There are right to right and left to right collaterals. Mid LAD lesion, 100 %stenosed. LIMA to LAD is atretic. LAD fills from flow from the SVG to diagonal filling the LAD. Ost 2nd Mrg to 2nd Mrg lesion, 99 %stenosed. SVG to OM2 is occluded. Ost 1st Mrg to 1st Mrg lesion, 80 %stenosed. Free RIMA graft to OM 1 is occluded. A STENT PROMUS PREM MR [id number] drug eluting stent was successfully placed., postdilated to > 4mm. Post intervention,  there is a 0% residual stenosis. Mid Cx lesion, 80 %stenosed. THis was treated with initial cutting balloon angioplasty with a 2.5 mm balloon prior to stent placement in the OM. Then after stent placement, PTCA was done with a 2.0 balloon. Post intervention, there is a 10% residual stenosis. The left ventricular ejection fraction is 25-35% by visual estimate. LV end diastolic pressure is moderately elevated. There is moderate to severe left ventricular systolic dysfunction. There is no aortic valve stenosis.   Severe coronary artery disease. Severe graft disease. Only patent graft is the SVG to diagonal which feeds both the  diagonal and the LAD. Successful stenting of a large OM1 and balloon angioplasty of the mid circumflex. The RCA territory fills by right to right and left to right collaterals. Continue aggressive secondary prevention. He'll need dual antiplatelet therapy for at least a year. Consider clopidogrel longer-term given the extent of his disease.     10/2018 echo IMPRESSIONS      1. The left ventricle has mild-moderately reduced systolic function, with an ejection fraction of 40-45%. The cavity size was normal. There is mildly increased left ventricular wall thickness. Left ventricular diastolic Doppler parameters are  indeterminate.  2. Left atrial size was severely dilated.  3. Right atrial size was moderately dilated.  4. The aortic valve is tricuspid. Mild thickening of the aortic valve. Mild calcification of the aortic valve. No stenosis of the aortic valve. Mild aortic annular calcification noted.  5. The mitral valve is abnormal. Mild thickening of the mitral valve leaflet. Mild calcification of the mitral valve leaflet. There is mild mitral annular calcification present. No evidence of mitral valve stenosis.  6. The aortic root is normal in size and structure.  7. Pulmonary hypertension is indeterminant, inadequate TR jet.  8. The interatrial septum was not well  visualized.        Assessment and Plan  1. CAD/Chronic systolic HF/ICM - no symptoms - he is not interested in farxiga at this time due to cost - continue other meds   2. Afib/acquired thrombophilia - no recent symptoms, continue current meds including xarelto for stroke prevention   3. Bradycardia/Pacemaker -no symptoms, recent device check showed normal function - continue to monitor   4.  HTN - bp at goal, continue current meds   5. Hyperlipidemia - would aim for LDL goal of <55. Most recent LDL was 72, we discussed changing lipitor to crestor however he is not in favor at this time. - continue current meds   F/u 6 months  Antoine Poche, M.D.

## 2022-11-20 NOTE — Patient Instructions (Signed)
Medication Instructions:  Your physician has recommended you make the following change in your medication:  Stop taking Marcelline Deist  Continue taking all other medications as prescribed  Labwork: None  Testing/Procedures: None  Follow-Up: Your physician recommends that you schedule a follow-up appointment in: 6 months  Any Other Special Instructions Will Be Listed Below (If Applicable).  If you need a refill on your cardiac medications before your next appointment, please call your pharmacy.

## 2022-12-11 ENCOUNTER — Ambulatory Visit (INDEPENDENT_AMBULATORY_CARE_PROVIDER_SITE_OTHER): Payer: Medicare PPO

## 2022-12-11 DIAGNOSIS — I442 Atrioventricular block, complete: Secondary | ICD-10-CM | POA: Diagnosis not present

## 2022-12-11 LAB — CUP PACEART REMOTE DEVICE CHECK
Battery Remaining Longevity: 30 mo
Battery Remaining Percentage: 30 %
Battery Voltage: 2.93 V
Brady Statistic AP VP Percent: 0 %
Brady Statistic AP VS Percent: 0 %
Brady Statistic AS VP Percent: 0 %
Brady Statistic AS VS Percent: 0 %
Brady Statistic RA Percent Paced: 1 %
Date Time Interrogation Session: 20240807061040
Implantable Lead Connection Status: 753985
Implantable Lead Connection Status: 753985
Implantable Lead Connection Status: 753985
Implantable Lead Implant Date: 20190822
Implantable Lead Implant Date: 20190822
Implantable Lead Implant Date: 20190822
Implantable Lead Location: 753858
Implantable Lead Location: 753859
Implantable Lead Location: 753860
Implantable Pulse Generator Implant Date: 20190822
Lead Channel Impedance Value: 460 Ohm
Lead Channel Impedance Value: 530 Ohm
Lead Channel Impedance Value: 590 Ohm
Lead Channel Pacing Threshold Amplitude: 0.625 V
Lead Channel Pacing Threshold Amplitude: 0.625 V
Lead Channel Pacing Threshold Pulse Width: 0.5 ms
Lead Channel Pacing Threshold Pulse Width: 0.5 ms
Lead Channel Sensing Intrinsic Amplitude: 2.2 mV
Lead Channel Sensing Intrinsic Amplitude: 7.8 mV
Lead Channel Setting Pacing Amplitude: 2 V
Lead Channel Setting Pacing Amplitude: 2 V
Lead Channel Setting Pacing Amplitude: 2.5 V
Lead Channel Setting Pacing Pulse Width: 0.5 ms
Lead Channel Setting Pacing Pulse Width: 0.5 ms
Lead Channel Setting Sensing Sensitivity: 4 mV
Pulse Gen Model: 3562
Pulse Gen Serial Number: 9056721

## 2022-12-15 ENCOUNTER — Other Ambulatory Visit: Payer: Self-pay | Admitting: Cardiology

## 2022-12-26 NOTE — Progress Notes (Signed)
Remote pacemaker transmission.   

## 2023-01-21 DIAGNOSIS — C44722 Squamous cell carcinoma of skin of right lower limb, including hip: Secondary | ICD-10-CM | POA: Diagnosis not present

## 2023-01-29 DIAGNOSIS — D485 Neoplasm of uncertain behavior of skin: Secondary | ICD-10-CM | POA: Diagnosis not present

## 2023-01-29 DIAGNOSIS — D044 Carcinoma in situ of skin of scalp and neck: Secondary | ICD-10-CM | POA: Diagnosis not present

## 2023-01-29 DIAGNOSIS — L570 Actinic keratosis: Secondary | ICD-10-CM | POA: Diagnosis not present

## 2023-02-04 DIAGNOSIS — Z23 Encounter for immunization: Secondary | ICD-10-CM | POA: Diagnosis not present

## 2023-02-06 DIAGNOSIS — C4442 Squamous cell carcinoma of skin of scalp and neck: Secondary | ICD-10-CM | POA: Diagnosis not present

## 2023-02-28 ENCOUNTER — Other Ambulatory Visit: Payer: Self-pay | Admitting: Cardiology

## 2023-03-12 ENCOUNTER — Ambulatory Visit (INDEPENDENT_AMBULATORY_CARE_PROVIDER_SITE_OTHER): Payer: Medicare PPO

## 2023-03-12 DIAGNOSIS — I442 Atrioventricular block, complete: Secondary | ICD-10-CM

## 2023-03-15 ENCOUNTER — Other Ambulatory Visit: Payer: Self-pay | Admitting: Cardiology

## 2023-03-17 LAB — CUP PACEART REMOTE DEVICE CHECK
Battery Remaining Longevity: 26 mo
Battery Remaining Percentage: 27 %
Battery Voltage: 2.92 V
Brady Statistic AP VP Percent: 0 %
Brady Statistic AP VS Percent: 0 %
Brady Statistic AS VP Percent: 0 %
Brady Statistic AS VS Percent: 0 %
Brady Statistic RA Percent Paced: 1 %
Date Time Interrogation Session: 20241108215503
Implantable Lead Connection Status: 753985
Implantable Lead Connection Status: 753985
Implantable Lead Connection Status: 753985
Implantable Lead Implant Date: 20190822
Implantable Lead Implant Date: 20190822
Implantable Lead Implant Date: 20190822
Implantable Lead Location: 753858
Implantable Lead Location: 753859
Implantable Lead Location: 753860
Implantable Pulse Generator Implant Date: 20190822
Lead Channel Impedance Value: 490 Ohm
Lead Channel Impedance Value: 560 Ohm
Lead Channel Impedance Value: 640 Ohm
Lead Channel Pacing Threshold Amplitude: 0.625 V
Lead Channel Pacing Threshold Amplitude: 0.625 V
Lead Channel Pacing Threshold Pulse Width: 0.5 ms
Lead Channel Pacing Threshold Pulse Width: 0.5 ms
Lead Channel Sensing Intrinsic Amplitude: 2.2 mV
Lead Channel Sensing Intrinsic Amplitude: 9.1 mV
Lead Channel Setting Pacing Amplitude: 2 V
Lead Channel Setting Pacing Amplitude: 2 V
Lead Channel Setting Pacing Amplitude: 2.5 V
Lead Channel Setting Pacing Pulse Width: 0.5 ms
Lead Channel Setting Pacing Pulse Width: 0.5 ms
Lead Channel Setting Sensing Sensitivity: 4 mV
Pulse Gen Model: 3562
Pulse Gen Serial Number: 9056721

## 2023-03-17 NOTE — Telephone Encounter (Signed)
Prescription refill request for Xarelto received.  Indication: AF Last office visit: 11/20/22  Dominga Ferry MD Weight: 103.4kg Age: 72 Scr: 1.16 on 04/18/22 CrCl: 86.66  Based on above findings Xarelto 20mg  daily is the appropriate dose.  Refill approved.

## 2023-03-18 ENCOUNTER — Ambulatory Visit: Payer: Medicare PPO | Attending: Internal Medicine | Admitting: Internal Medicine

## 2023-03-18 ENCOUNTER — Encounter: Payer: Self-pay | Admitting: Internal Medicine

## 2023-03-18 VITALS — BP 124/80 | HR 80 | Ht 74.0 in | Wt 229.0 lb

## 2023-03-18 DIAGNOSIS — I442 Atrioventricular block, complete: Secondary | ICD-10-CM

## 2023-03-18 LAB — CUP PACEART INCLINIC DEVICE CHECK
Battery Remaining Longevity: 26 mo
Battery Voltage: 2.92 V
Brady Statistic RA Percent Paced: 0 %
Brady Statistic RV Percent Paced: 97 %
Date Time Interrogation Session: 20241112093716
Implantable Lead Connection Status: 753985
Implantable Lead Connection Status: 753985
Implantable Lead Connection Status: 753985
Implantable Lead Implant Date: 20190822
Implantable Lead Implant Date: 20190822
Implantable Lead Implant Date: 20190822
Implantable Lead Location: 753858
Implantable Lead Location: 753859
Implantable Lead Location: 753860
Implantable Pulse Generator Implant Date: 20190822
Lead Channel Impedance Value: 475 Ohm
Lead Channel Impedance Value: 537.5 Ohm
Lead Channel Impedance Value: 625 Ohm
Lead Channel Pacing Threshold Amplitude: 0.625 V
Lead Channel Pacing Threshold Amplitude: 0.625 V
Lead Channel Pacing Threshold Pulse Width: 0.5 ms
Lead Channel Pacing Threshold Pulse Width: 0.5 ms
Lead Channel Sensing Intrinsic Amplitude: 10.4 mV
Lead Channel Sensing Intrinsic Amplitude: 2.1 mV
Lead Channel Setting Pacing Amplitude: 2 V
Lead Channel Setting Pacing Amplitude: 2 V
Lead Channel Setting Pacing Amplitude: 2.5 V
Lead Channel Setting Pacing Pulse Width: 0.5 ms
Lead Channel Setting Pacing Pulse Width: 0.5 ms
Lead Channel Setting Sensing Sensitivity: 4 mV
Pulse Gen Model: 3562
Pulse Gen Serial Number: 9056721

## 2023-03-18 NOTE — Progress Notes (Signed)
HPI [name] returns today for followup. He is a pleasant 72 yo man with a h/o chronic systolic heart failure and persistent atrial fib, s/p DCCV. He has a remote h/o MI, s/p CABG. He has HTN. In the interim, he notes no palpitations, chest pain or sob or edema. No syncope.  Allergies  Allergen Reactions   Acetaminophen Other (See Comments)    Hallucinations     Current Outpatient Medications  Medication Sig Dispense Refill   atorvastatin (LIPITOR) 80 MG tablet TAKE 1 TABLET(80 MG) BY MOUTH DAILY 60 tablet 3   clonazePAM (KLONOPIN) 0.5 MG tablet Take 0.5 mg by mouth at bedtime as needed for anxiety.     metoprolol (TOPROL-XL) 200 MG 24 hr tablet TAKE 1 TABLET(200 MG) BY MOUTH DAILY 90 tablet 3   nitroGLYCERIN (NITROSTAT) 0.4 MG SL tablet Place 0.4 mg under the tongue every 5 (five) minutes as needed for chest pain.     pantoprazole (PROTONIX) 40 MG tablet TAKE 1 TABLET BY MOUTH EVERY DAY 90 tablet 1   sacubitril-valsartan (ENTRESTO) 97-103 MG TAKE 1 TABLET BY MOUTH TWICE DAILY 180 tablet 3   sildenafil (REVATIO) 20 MG tablet Take 20-100 mg by mouth as needed (30 minutes prior to sexual activity).   6   spironolactone (ALDACTONE) 25 MG tablet TAKE 1/2 TABLET BY MOUTH EVERY DAY 45 tablet 1   XARELTO 20 MG TABS tablet TAKE 1 TABLET BY MOUTH EVERY DAY 90 tablet 1   No current facility-administered medications for this visit.     Past Medical History:  Diagnosis Date   AF (atrial fibrillation) (HCC) 12/29/2017   CAD in native artery    08/05/16 Cath, atresia of LIMA to LAD, occlusion of SVG-->RCA, SVG-->Circumflex/OM2, SVG-->OM1, patent SVG to diag, DES to native circumflex.    Erectile dysfunction    Hyperlipidemia, mixed    Myocardial infarction [facility]) 2001   Out of hospital myocardial infarction/inferior wall, status post Cardiolite study 2007.  Ejection fraction 45%   Pacemaker 12/2017   Unspecified essential hypertension     ROS:   All systems reviewed and negative  except as noted in the HPI.   Past Surgical History:  Procedure Laterality Date   BIV PACEMAKER INSERTION CRT-P N/A 12/25/2017   Procedure: BIV PACEMAKER INSERTION CRT-P;  Surgeon: Marinus Maw, MD;  Location: [facility] INVASIVE CV LAB;  Service: Cardiovascular;  Laterality: N/A;   CARDIOVERSION N/A 02/04/2018   Procedure: CARDIOVERSION;  Surgeon: Antoine Poche, MD;  [facility];  Service: Endoscopy;  Laterality: N/A;   CATARACT EXTRACTION     CORONARY ARTERY BYPASS GRAFT  2001   2001.  LIMA to LAD, SVG to OM1, SVG to OM 2, SVG to diagonal, SVG to PDA.   CORONARY BALLOON ANGIOPLASTY N/A 08/05/2016   Procedure: Coronary Balloon Angioplasty;  Surgeon: Corky Crafts, MD;  Location: [facility] INVASIVE CV LAB;  Service: Cardiovascular;  Laterality: N/A;  CFX   CORONARY STENT INTERVENTION N/A 08/05/2016   Procedure: Coronary Stent Intervention;  Surgeon: Corky Crafts, MD;  Location: [facility] INVASIVE CV LAB;  Service: Cardiovascular;  Laterality: N/A;  OM   LAPAROSCOPIC CHOLECYSTECTOMY  06/22/2013   Dr. Cristy Folks   LEFT HEART CATH AND CORS/GRAFTS ANGIOGRAPHY N/A 08/05/2016   Procedure: Left Heart Cath and Cors/Grafts Angiography;  Surgeon: Corky Crafts, MD;  Location: [facility] INVASIVE CV LAB;  Service: Cardiovascular;  Laterality: N/A;     Family History  Problem Relation Age of Onset  Alzheimer's disease Mother    Brain cancer Mother        brain tumor   Stroke Father    Alzheimer's disease Father      Social History   Socioeconomic History   Marital status: Married    Spouse name: Not on file   Number of children: Not on file   Years of education: Not on file   Highest education level: Not on file  Occupational History   Occupation: Full time    Employer: [employer]  Tobacco Use   Smoking status: Never   Smokeless tobacco: Never  Vaping Use   Vaping status: Never Used  Substance and Sexual Activity   Alcohol use: Yes    Alcohol/week: 0.0 standard drinks of alcohol     Comment: RARE   Drug use: No   Sexual activity: Not on file  Other Topics Concern   Not on file  Social History Narrative   Not on file   Social Determinants of Health   Financial Resource Strain: Not on file  Food Insecurity: Not on file  Transportation Needs: Not on file  Physical Activity: Not on file  Stress: Not on file  Social Connections: Unknown (09/16/2021)   Received from [facility]   Social Network    Social Network: Not on file  Intimate Partner Violence: Unknown (08/08/2021)   Received from [employer] [facility], [facility]   HITS    Physically Hurt: Not on file    Insult or Talk Down To: Not on file    Threaten Physical Harm: Not on file    Scream or Curse: Not on file     BP 124/80   Pulse 80   Ht 6\' 2"  (1.88 m)   Wt 229 lb (103.9 kg)   SpO2 98%   BMI 29.40 kg/m   Physical Exam:  Well appearing NAD HEENT: Unremarkable Neck:  No JVD, no thyromegally Lymphatics:  No adenopathy Back:  No CVA tenderness Lungs:  Clear HEART:  Regular rate rhythm, no murmurs, no rubs, no clicks Abd:  soft, positive bowel sounds, no organomegally, no rebound, no guarding Ext:  2 plus pulses, no edema, no cyanosis, no clubbing Skin:  No rashes no nodules Neuro:  CN II through XII intact, motor grossly intact  DEVICE  Normal device function.  See PaceArt for details.   Assess/Plan: 1. Atrial fib - he is out of rhythm now 100% of the time. He is asymptomatic. No plans for NSR has he feels well with his rate controlled. 2. CHB - he is pacing 99% and has no escape today. He is asymptomatic. 3. CAD - he remains active but denies anginal symptoms. No change in meds. 4. coags - he has not had any bleeding on Xarelto.  5. PPM -his St. Jude biv PPM is working normally with over 2.2 years of battery longevity.   Sharlot Gowda Amali Uhls,MD

## 2023-03-18 NOTE — Patient Instructions (Signed)

## 2023-04-01 NOTE — Progress Notes (Signed)
Remote pacemaker transmission.   

## 2023-04-09 NOTE — Progress Notes (Unsigned)
Referring Provider: Estanislado Pandy, MD Primary Care Physician:  Estanislado Pandy, MD Primary Gastroenterologist:  Dr. Jena Gauss  Chief Complaint  Patient presents with   positive fit test    Positive fit test     HPI:   Michael Melton is a 72 y.o. male presenting today at the request of Dr. Neita Carp for positive FIT completed 03/24/23.   Patient reports do well overall. No GI concerns.  Denies BRBPR, melena, change in bowel habits, constipation, diarrhea, abdominal pain, reflux symptoms, dysphagia.  He does have an umbilical hernia, but this does not cause any pain.  Has umbilical hernia. No pain.   Prior colonoscopy: Many years ago. [place]. No history of polyps.   Past Medical History:  Diagnosis Date   AF (atrial fibrillation) (HCC) 12/29/2017   CAD in native artery    08/05/16 Cath, atresia of LIMA to LAD, occlusion of SVG-->RCA, SVG-->Circumflex/OM2, SVG-->OM1, patent SVG to diag, DES to native circumflex.    Erectile dysfunction    Hyperlipidemia, mixed    Myocardial infarction [facility]) 2001   Out of hospital myocardial infarction/inferior wall, status post Cardiolite study 2007.  Ejection fraction 45%   Pacemaker 12/2017   Unspecified essential hypertension     Past Surgical History:  Procedure Laterality Date   BIV PACEMAKER INSERTION CRT-P N/A 12/25/2017   Procedure: BIV PACEMAKER INSERTION CRT-P;  Surgeon: Marinus Maw, MD;  Location: [facility] INVASIVE CV LAB;  Service: Cardiovascular;  Laterality: N/A;   CARDIOVERSION N/A 02/04/2018   Procedure: CARDIOVERSION;  Surgeon: Antoine Poche, MD;  Location: [facility];  Service: Endoscopy;  Laterality: N/A;   CATARACT EXTRACTION     CORONARY ARTERY BYPASS GRAFT  2001   2001.  LIMA to LAD, SVG to OM1, SVG to OM 2, SVG to diagonal, SVG to PDA.   CORONARY BALLOON ANGIOPLASTY N/A 08/05/2016   Procedure: Coronary Balloon Angioplasty;  Surgeon: Corky Crafts, MD;  Location: [facility] INVASIVE CV LAB;  Service: Cardiovascular;   Laterality: N/A;  CFX   CORONARY STENT INTERVENTION N/A 08/05/2016   Procedure: Coronary Stent Intervention;  Surgeon: Corky Crafts, MD;  Location: [facility] INVASIVE CV LAB;  Service: Cardiovascular;  Laterality: N/A;  OM   LAPAROSCOPIC CHOLECYSTECTOMY  06/22/2013   Dr. Cristy Folks   LEFT HEART CATH AND CORS/GRAFTS ANGIOGRAPHY N/A 08/05/2016   Procedure: Left Heart Cath and Cors/Grafts Angiography;  Surgeon: Corky Crafts, MD;  Location: [facility] INVASIVE CV LAB;  Service: Cardiovascular;  Laterality: N/A;    Current Outpatient Medications  Medication Sig Dispense Refill   atorvastatin (LIPITOR) 80 MG tablet TAKE 1 TABLET(80 MG) BY MOUTH DAILY 60 tablet 3   metoprolol (TOPROL-XL) 200 MG 24 hr tablet TAKE 1 TABLET(200 MG) BY MOUTH DAILY 90 tablet 3   nitroGLYCERIN (NITROSTAT) 0.4 MG SL tablet Place 0.4 mg under the tongue every 5 (five) minutes as needed for chest pain.     pantoprazole (PROTONIX) 40 MG tablet TAKE 1 TABLET BY MOUTH EVERY DAY 90 tablet 1   sacubitril-valsartan (ENTRESTO) 97-103 MG TAKE 1 TABLET BY MOUTH TWICE DAILY 180 tablet 3   sildenafil (REVATIO) 20 MG tablet Take 20-100 mg by mouth as needed (30 minutes prior to sexual activity).   6   spironolactone (ALDACTONE) 25 MG tablet TAKE 1/2 TABLET BY MOUTH EVERY DAY 45 tablet 1   XARELTO 20 MG TABS tablet TAKE 1 TABLET BY MOUTH EVERY DAY 90 tablet 1   clonazePAM (KLONOPIN) 0.5 MG tablet Take 0.5 mg  by mouth at bedtime as needed for anxiety. (Patient not taking: Reported on 04/10/2023)     No current facility-administered medications for this visit.    Allergies as of 04/10/2023 - Review Complete 04/10/2023  Allergen Reaction Noted   Acetaminophen Other (See Comments)     Family History  Problem Relation Age of Onset   Alzheimer's disease Mother    Brain cancer Mother        brain tumor   Stroke Father    Alzheimer's disease Father     Social History   Socioeconomic History   Marital status: Married    Spouse name: Not  on file   Number of children: Not on file   Years of education: Not on file   Highest education level: Not on file  Occupational History   Occupation: Full time    Employer: [employer]  Tobacco Use   Smoking status: Never   Smokeless tobacco: Never  Vaping Use   Vaping status: Never Used  Substance and Sexual Activity   Alcohol use: Yes    Alcohol/week: 0.0 standard drinks of alcohol    Comment: RARE   Drug use: No   Sexual activity: Not on file  Other Topics Concern   Not on file  Social History Narrative   Not on file   Social Determinants of Health   Financial Resource Strain: Not on file  Food Insecurity: Not on file  Transportation Needs: Not on file  Physical Activity: Not on file  Stress: Not on file  Social Connections: Unknown (09/16/2021)   Received from [facility]   Social Network    Social Network: Not on file  Intimate Partner Violence: Unknown (08/08/2021)   Received from [facility]   HITS    Physically Hurt: Not on file    Insult or Talk Down To: Not on file    Threaten Physical Harm: Not on file    Scream or Curse: Not on file    Review of Systems: Gen: Denies any fever, chills, cold or flulike symptoms, presyncope, syncope. CV: Denies chest pain, heart palpitations. Resp: Denies shortness of breath, cough.  GI: See HPI GU : Denies urinary burning, urinary frequency, urinary hesitancy MS: Denies joint pain. Derm: Denies rash. Psych: Denies depression, anxiety. Heme: See HPI  Physical Exam: BP 138/88 (BP Location: Right Arm, Patient Position: Sitting, Cuff Size: Large)   Pulse (!) 102   Temp 97.6 F (36.4 C) (Temporal)   Ht 6' (1.829 m)   Wt 234 lb (106.1 kg)   BMI 31.74 kg/m  General:   Alert and oriented. Pleasant and cooperative. Well-nourished and well-developed.  Head:  Normocephalic and atraumatic. Eyes:  Without icterus, sclera clear and conjunctiva pink.  Ears:  Normal auditory acuity. Lungs:   Clear to auscultation bilaterally. No wheezes, rales, or rhonchi. No distress.  Heart:  S1, S2 present without murmurs appreciated.  Abdomen:  +BS, soft, non-tender and non-distended. No HSM noted. No guarding or rebound. No masses appreciated.  Rectal:  Deferred  Msk:  Symmetrical without gross deformities. Normal posture. Extremities:  Without edema. Neurologic:  Alert and  oriented x4;  grossly normal neurologically. Skin:  Intact without significant lesions or rashes. Psych:  Normal mood and affect.    Assessment:  72 year old male with history of CAD, prior MI s/p CABG, prior DES, chronic systolic heart failure, complete heart block s/p biventricular pacemaker implant, atrial fibrillation on Xarelto, HTN, HLD, presenting today at the request  of Dr. Neita Carp for further evaluation of positive FIT completed 03/24/2023.  Patient denies any significant GI symptoms.  No BRBPR or melena.  Reports last colonoscopy was many years ago at [facility] with no history of polyps.  We do not have any record of this on file.  He will need a colonoscopy to further evaluate recent positive FIT.   Plan:  Proceed with colonoscopy with propofol by Dr. Jena Gauss in near future. The risks, benefits, and alternatives have been discussed with the patient in detail. The patient states understanding and desires to proceed.  ASA 3 Get ok to hold Xarelto x 48 hours from cardiology.  Follow-up prn.   Ermalinda Memos, PA-C [facility] 04/10/2023

## 2023-04-10 ENCOUNTER — Telehealth: Payer: Self-pay

## 2023-04-10 ENCOUNTER — Encounter: Payer: Self-pay | Admitting: Gastroenterology

## 2023-04-10 ENCOUNTER — Ambulatory Visit: Payer: Medicare PPO | Admitting: Gastroenterology

## 2023-04-10 ENCOUNTER — Telehealth: Payer: Self-pay | Admitting: *Deleted

## 2023-04-10 VITALS — BP 138/88 | HR 102 | Temp 97.6°F | Ht 72.0 in | Wt 234.0 lb

## 2023-04-10 DIAGNOSIS — I4891 Unspecified atrial fibrillation: Secondary | ICD-10-CM

## 2023-04-10 DIAGNOSIS — R195 Other fecal abnormalities: Secondary | ICD-10-CM

## 2023-04-10 DIAGNOSIS — Z01818 Encounter for other preprocedural examination: Secondary | ICD-10-CM

## 2023-04-10 NOTE — Telephone Encounter (Signed)
  Request for patient to stop medication prior to procedure or is needing cleareance  04/10/23  Michael Melton Jul 01, 1950  What type of surgery is being performed? Colonoscopy  When is surgery scheduled? TBD  What type of clearance is required (medical or pharmacy to hold medication or both? medication  Are there any medications that need to be held prior to surgery and how long? Xarelto x 2 days  Name of physician performing surgery?  Dr.Rourk [facility] [employer]: [phone] Fax: [phone]  Anethesia type (none, local, MAC, general)? MAC

## 2023-04-10 NOTE — Telephone Encounter (Signed)
Preoperative team, patient does not have a recent lab work.  Please contact patient and order CBC and BMP.  Once lab results have resulted we will be able to provide recommendations for anticoagulation hold.  Thank you for your help.  Thomasene Ripple. Zeanna Sunde NP-C     04/10/2023, 11:35 AM [facility] [address] Office [phone] Fax [phone]

## 2023-04-10 NOTE — Addendum Note (Signed)
Addended by: Tarri Fuller on: 04/10/2023 01:16 PM   Modules accepted: Orders

## 2023-04-10 NOTE — Telephone Encounter (Signed)
I s/w the pt and he is agreeable to get labs done for pre op clearance. BMET/CBC to be done in [place]. I stated to the pt that I cam going to have someone from the [place] office to call him and let him know exactly where to go for the lab work. I believe our [place] office does not draw labs in office and pt needs to go to a [facility] but I will have [place] office call the pt and confirm this.   I will place the labs in for [facility] and release for the labs work.

## 2023-04-10 NOTE — Telephone Encounter (Signed)
Gave patient location of [facility] near/in his residential area. Pt had no further questions or concerns at this time.

## 2023-04-10 NOTE — Telephone Encounter (Signed)
-----   Message from [facility] sent at 04/10/2023  1:16 PM EST ----- Regarding: HELP PLEASE LABS Hi everyone,   Pt is going to need to get labs for pre op clearance. I have placed the orders under [facility] and have released. I am not sure where to tell the pt to get the labs, so I told I will ask one of the ladies in [place] to call him and let him know where to go for the lab work.   I really would appreciate if someone can call the pt and let him know where to go for the lab work.   Thank you  Okey Regal

## 2023-04-10 NOTE — Patient Instructions (Signed)
We will get you scheduled for colonoscopy in the near future with Dr. Jena Gauss at [facility].  We are reaching out to your cardiologist to get approval to hold Xarelto for 48 hours prior to your procedure.  We will see you back in the office as needed.  It was nice to meet you today!  Ermalinda Memos, PA-C [facility]

## 2023-04-10 NOTE — Telephone Encounter (Signed)
Patient with diagnosis of A Fib on Xarelto for anticoagulation.    Procedure: Colonoscopy   Date of procedure: TBD   CHA2DS2-VASc Score = 4  This indicates a 4.8% annual risk of stroke. The patient's score is based upon: CHF History: 1 HTN History: 1 Diabetes History: 0 Stroke History: 0 Vascular Disease History: 1 Age Score: 1 Gender Score: 0   CrCl overdue Platelet count overdue   Needs updated lab work before clearance can be completed  **This guidance is not considered finalized until pre-operative APP has relayed final recommendations.**

## 2023-04-14 ENCOUNTER — Encounter: Payer: Self-pay | Admitting: Cardiology

## 2023-04-14 DIAGNOSIS — Z79899 Other long term (current) drug therapy: Secondary | ICD-10-CM | POA: Diagnosis not present

## 2023-04-15 NOTE — Telephone Encounter (Signed)
Crcl 72 ml/min  Patient may hold Xarelto for 2 days prior to procedure.

## 2023-04-16 ENCOUNTER — Encounter: Payer: Self-pay | Admitting: *Deleted

## 2023-04-16 ENCOUNTER — Other Ambulatory Visit: Payer: Self-pay | Admitting: *Deleted

## 2023-04-16 MED ORDER — PEG 3350-KCL-NA BICARB-NACL 420 G PO SOLR: 4000.0000 mL | Freq: Once | ORAL | 0 refills | Status: AC

## 2023-04-16 NOTE — Telephone Encounter (Signed)
OK to proceed with scheduling procedures.  Patient will hold Xarelto for 48 hours prior.

## 2023-04-16 NOTE — Telephone Encounter (Addendum)
   Patient Name: Michael Melton  DOB: 09/21/1950 MRN: [id number]  Primary Cardiologist: Dina Rich, MD  Clinical pharmacists have reviewed the patient's past medical history, labs, and current medications as part of preoperative protocol coverage. The following recommendations have been made:  Patient can hold Xarelto 2 days prior to procedure and should restart postprocedure when surgically safe and hemostasis is achieved.   I will route this recommendation to the requesting party via Epic fax function and remove from pre-op pool.  Please call with questions.  Napoleon Form, Leodis Rains, NP 04/16/2023, 8:03 AM

## 2023-04-16 NOTE — Telephone Encounter (Signed)
Pt has been scheduled for 05/23/23. Instructions mailed and prep sent to the pharmacy   [employer] PA: Approved Authorization #[id number]  Tracking #[id number] Dates of service 05/23/2023 - 08/20/2023

## 2023-04-17 ENCOUNTER — Encounter: Payer: Self-pay | Admitting: *Deleted

## 2023-04-28 ENCOUNTER — Telehealth: Payer: Self-pay | Admitting: *Deleted

## 2023-04-28 NOTE — Telephone Encounter (Signed)
Notified wife Cordelia Pen), copy to pcp.

## 2023-04-28 NOTE — Telephone Encounter (Signed)
-----   Message from Dina Rich sent at 04/27/2023  8:15 AM EST ----- Normal labs  Dominga Ferry MD

## 2023-05-15 NOTE — Patient Instructions (Addendum)
 Your procedure is scheduled on: 05/23/2023   Report to [facility] Main Entrance at     12:15 PM.   Call this number if you have problems the morning of surgery: [phone]   Remember:              Follow Directions on the letter you received from Your Physician's office regarding the Bowel Prep              Hold Xarelto  for 2 days as instructed in the letter from the office   Last dose of Xarelto  should be on 05/20/2023              No Smoking the day of Procedure :   Take these medicines the morning of surgery with A SIP OF WATER : Metoprolol  and Pantoprazole    Do not wear jewelry, make-up or nail polish.    Do not bring valuables to the hospital.  Contacts, dentures or bridgework may not be worn into surgery.  .   Patients discharged the day of surgery will not be allowed to drive home.     Colonoscopy, Adult, Care After This sheet gives you information about how to care for yourself after your procedure. Your health care provider may also give you more specific instructions. If you have problems or questions, contact your health care provider. What can I expect after the procedure? After the procedure, it is common to have: A small amount of blood in your stool for 24 hours after the procedure. Some gas. Mild abdominal cramping or bloating.  Follow these instructions at home: General instructions  For the first 24 hours after the procedure: Do not drive or use machinery. Do not sign important documents. Do not drink alcohol. Do your regular daily activities at a slower pace than normal. Eat soft, easy-to-digest foods. Rest often. Take over-the-counter or prescription medicines only as told by your health care provider. It is up to you to get the results of your procedure. Ask your health care provider, or the department performing the procedure, when your results will be ready. Relieving cramping and bloating Try walking around when you have cramps or feel  bloated. Apply heat to your abdomen as told by your health care provider. Use a heat source that your health care provider recommends, such as a moist heat pack or a heating pad. Place a towel between your skin and the heat source. Leave the heat on for 20-30 minutes. Remove the heat if your skin turns bright red. This is especially important if you are unable to feel pain, heat, or cold. You may have a greater risk of getting burned. Eating and drinking Drink enough fluid to keep your urine clear or pale yellow. Resume your normal diet as instructed by your health care provider. Avoid heavy or fried foods that are hard to digest. Avoid drinking alcohol for as long as instructed by your health care provider. Contact a health care provider if: You have blood in your stool 2-3 days after the procedure. Get help right away if: You have more than a small spotting of blood in your stool. You pass large blood clots in your stool. Your abdomen is swollen. You have nausea or vomiting. You have a fever. You have increasing abdominal pain that is not relieved with medicine. This information is not intended to replace advice given to you by your health care provider. Make sure you discuss any questions you have with your health care provider. Document Released:  12/05/2003 Document Revised: 01/15/2016 Document Reviewed: 07/04/2015 Elsevier Interactive Patient Education  [employer].

## 2023-05-16 ENCOUNTER — Encounter (HOSPITAL_COMMUNITY): Payer: Self-pay

## 2023-05-16 ENCOUNTER — Encounter (HOSPITAL_COMMUNITY)
Admission: RE | Admit: 2023-05-16 | Discharge: 2023-05-16 | Disposition: A | Discharge status: Home / Self Care | Payer: Medicare PPO | Source: Ambulatory Visit | Attending: Internal Medicine | Admitting: Internal Medicine

## 2023-05-16 VITALS — BP 152/88 | HR 77 | Temp 97.8°F | Resp 18 | Ht 72.0 in | Wt 233.9 lb

## 2023-05-16 DIAGNOSIS — I4891 Unspecified atrial fibrillation: Secondary | ICD-10-CM | POA: Diagnosis not present

## 2023-05-16 DIAGNOSIS — Z01810 Encounter for preprocedural cardiovascular examination: Secondary | ICD-10-CM | POA: Insufficient documentation

## 2023-05-16 DIAGNOSIS — I10 Essential (primary) hypertension: Secondary | ICD-10-CM | POA: Insufficient documentation

## 2023-05-16 HISTORY — DX: Cardiac arrhythmia, unspecified: I49.9

## 2023-05-19 ENCOUNTER — Encounter: Payer: Self-pay | Admitting: Internal Medicine

## 2023-05-19 NOTE — Progress Notes (Signed)
 PERIOPERATIVE PRESCRIPTION FOR IMPLANTED CARDIAC DEVICE PROGRAMMING  Patient Information: Name:  Michael Melton  DOB:  1950/12/07  MRN:  [id number]    Planned Procedure:  Colonoscopy with Propofol  Surgeon:  Dr. Shaaron  Date of Procedure: 05/23/23 Position during surgery:     Device Information:  Clinic EP Physician:  Danelle Birmingham, MD   Device Type:  Pacemaker Manufacturer and Phone #:  [employer]: [phone] Pacemaker Dependent?:  Yes.   Date of Last Device Check:  03/18/23 in clinic Normal Device Function?:  Yes.    Electrophysiologist's Recommendations:  Have magnet available. Provide continuous ECG monitoring when magnet is used or reprogramming is to be performed.  Procedure will likely interfere with device function.  Device should be programmed:  Asynchronous pacing during procedure and returned to normal programming after procedure  Per Device Clinic Standing Orders, Alan JAYSON Fees, RN  2:09 PM 05/19/2023

## 2023-05-22 ENCOUNTER — Telehealth: Payer: Self-pay | Admitting: *Deleted

## 2023-05-22 NOTE — Telephone Encounter (Signed)
He said he received a text from the pre-service center regarding his upcoming procedure. He says he was told he would have to pay $800.   Had been [employer] S at pre-service center regarding this issue. She had tried to talk with his insurance but kept getting the run around. Advised Star that the issue was taking care of.   Informed pt that spoke to Dr.Rourk and Baxter Hire H and they both confirmed that since he had a positive FIT that the colonoscopy will be considered diagnostic and that it could not be changed to screening but had to rule out a GI bleed due to the positive FIT. Told pt again, that he did not have to pay the $800 tomorrow, he can tell them to bill his insurance first. Pt verbalized understanding.

## 2023-05-23 ENCOUNTER — Ambulatory Visit (HOSPITAL_COMMUNITY): Payer: Medicare PPO | Admitting: Certified Registered Nurse Anesthetist

## 2023-05-23 ENCOUNTER — Encounter (HOSPITAL_COMMUNITY): Payer: Self-pay | Admitting: Internal Medicine

## 2023-05-23 ENCOUNTER — Encounter (HOSPITAL_COMMUNITY)
Admission: RE | Disposition: A | Discharge status: Home / Self Care | Payer: Self-pay | Source: Home / Self Care | Attending: Internal Medicine

## 2023-05-23 ENCOUNTER — Ambulatory Visit (HOSPITAL_COMMUNITY)
Admission: RE | Admit: 2023-05-23 | Discharge: 2023-05-23 | Disposition: A | Discharge status: Home / Self Care | Payer: Medicare PPO | Attending: Internal Medicine | Admitting: Internal Medicine

## 2023-05-23 ENCOUNTER — Other Ambulatory Visit: Payer: Self-pay

## 2023-05-23 DIAGNOSIS — Z1211 Encounter for screening for malignant neoplasm of colon: Secondary | ICD-10-CM | POA: Diagnosis not present

## 2023-05-23 DIAGNOSIS — Z7901 Long term (current) use of anticoagulants: Secondary | ICD-10-CM | POA: Diagnosis not present

## 2023-05-23 DIAGNOSIS — Z950 Presence of cardiac pacemaker: Secondary | ICD-10-CM | POA: Insufficient documentation

## 2023-05-23 DIAGNOSIS — I10 Essential (primary) hypertension: Secondary | ICD-10-CM

## 2023-05-23 DIAGNOSIS — I2510 Atherosclerotic heart disease of native coronary artery without angina pectoris: Secondary | ICD-10-CM | POA: Insufficient documentation

## 2023-05-23 DIAGNOSIS — K635 Polyp of colon: Secondary | ICD-10-CM | POA: Diagnosis not present

## 2023-05-23 DIAGNOSIS — I4891 Unspecified atrial fibrillation: Secondary | ICD-10-CM | POA: Diagnosis not present

## 2023-05-23 DIAGNOSIS — R195 Other fecal abnormalities: Secondary | ICD-10-CM | POA: Diagnosis not present

## 2023-05-23 DIAGNOSIS — K641 Second degree hemorrhoids: Secondary | ICD-10-CM

## 2023-05-23 DIAGNOSIS — I252 Old myocardial infarction: Secondary | ICD-10-CM | POA: Insufficient documentation

## 2023-05-23 DIAGNOSIS — D122 Benign neoplasm of ascending colon: Secondary | ICD-10-CM

## 2023-05-23 HISTORY — PX: COLONOSCOPY WITH PROPOFOL: SHX5780

## 2023-05-23 HISTORY — PX: POLYPECTOMY: SHX5525

## 2023-05-23 SURGERY — COLONOSCOPY WITH PROPOFOL
Anesthesia: General

## 2023-05-23 MED ORDER — PHENYLEPHRINE 80 MCG/ML (10ML) SYRINGE FOR IV PUSH (FOR BLOOD PRESSURE SUPPORT)
PREFILLED_SYRINGE | INTRAVENOUS | Status: AC
  Filled 2023-05-23: qty 10

## 2023-05-23 MED ORDER — PROPOFOL 500 MG/50ML IV EMUL
INTRAVENOUS | Status: AC
  Filled 2023-05-23: qty 50

## 2023-05-23 MED ORDER — PROPOFOL 500 MG/50ML IV EMUL
INTRAVENOUS | Status: DC | PRN
  Administered 2023-05-23: 60 mg via INTRAVENOUS
  Administered 2023-05-23: 150 ug/kg/min via INTRAVENOUS

## 2023-05-23 MED ORDER — PHENYLEPHRINE 80 MCG/ML (10ML) SYRINGE FOR IV PUSH (FOR BLOOD PRESSURE SUPPORT)
PREFILLED_SYRINGE | INTRAVENOUS | Status: DC | PRN
  Administered 2023-05-23 (×2): 80 ug via INTRAVENOUS

## 2023-05-23 MED ORDER — LACTATED RINGERS IV SOLN: INTRAVENOUS | Status: DC | PRN

## 2023-05-23 MED ORDER — SODIUM CHLORIDE 0.9% FLUSH
3.0000 mL | Freq: Two times a day (BID) | INTRAVENOUS | Status: DC
Start: 2023-05-23 — End: 2023-05-23

## 2023-05-23 MED ORDER — STERILE WATER FOR IRRIGATION IR SOLN
Status: DC | PRN
  Administered 2023-05-23: 60 mL

## 2023-05-23 MED ORDER — SODIUM CHLORIDE 0.9% FLUSH
3.0000 mL | INTRAVENOUS | Status: DC | PRN
Start: 2023-05-23 — End: 2023-05-23

## 2023-05-23 NOTE — Anesthesia Postprocedure Evaluation (Signed)
Anesthesia Post Note  Patient: Michael Melton  Procedure(s) Performed: COLONOSCOPY WITH PROPOFOL POLYPECTOMY  Patient location during evaluation: PACU Anesthesia Type: General Level of consciousness: awake and alert Pain management: pain level controlled Vital Signs Assessment: post-procedure vital signs reviewed and stable Respiratory status: spontaneous breathing, nonlabored ventilation, respiratory function stable and patient connected to nasal cannula oxygen Cardiovascular status: blood pressure returned to baseline and stable Postop Assessment: no apparent nausea or vomiting Anesthetic complications: no   There were no known notable events for this encounter.   Last Vitals:  Vitals:   05/23/23 1300 05/23/23 1420  BP:  108/62  Pulse: 72 76  Resp: 17 (!) 22  Temp:  37.6 C  SpO2: 99%     Last Pain:  Vitals:   05/23/23 1420  TempSrc: Axillary  PainSc: 0-No pain                 Mairyn Lenahan L Dyron Kawano

## 2023-05-23 NOTE — Op Note (Signed)
[facility] Patient Name: Michael Melton Procedure Date: 05/23/2023 11:42 AM MRN: [id number] Date of Birth: Jan 29, 1951 Attending MD: Gennette Pac , MD, [id number] CSN: [id number] Age: 73 Admit Type: Outpatient Procedure:                Colonoscopy Indications:              Positive fecal immunochemical test Providers:                Gennette Pac, MD, Angelica Ran, Elinor Parkinson Referring MD:              Medicines:                Propofol per Anesthesia Complications:            No immediate complications. Estimated Blood Loss:     Estimated blood loss was minimal. Procedure:                Pre-Anesthesia Assessment:                           - Prior to the procedure, a History and Physical                            was performed, and patient medications and                            allergies were reviewed. The patient's tolerance of                            previous anesthesia was also reviewed. The risks                            and benefits of the procedure and the sedation                            options and risks were discussed with the patient.                            All questions were answered, and informed consent                            was obtained. Prior Anticoagulants: The patient has                            taken no anticoagulant or antiplatelet agents and                            last took Eliquis (apixaban) 2 days prior to the                            procedure. ASA Grade Assessment: III - A patient  with severe systemic disease. After reviewing the                            risks and benefits, the patient was deemed in                            satisfactory condition to undergo the procedure.                           After obtaining informed consent, the colonoscope                            was passed under direct vision. Throughout the                             procedure, the patient's blood pressure, pulse, and                            oxygen saturations were monitored continuously. The                            [id number]) scope was introduced through the                            anus and advanced to the the cecum, identified by                            appendiceal orifice and ileocecal valve. The                            colonoscopy was performed without difficulty. The                            patient tolerated the procedure well. The quality                            of the bowel preparation was adequate. The                            ileocecal valve, appendiceal orifice, and rectum                            were photographed. The colonoscopy was performed                            without difficulty. The patient tolerated the                            procedure well. Scope In: 1:59:13 PM Scope Out: 2:13:06 PM Scope Withdrawal Time: 0 hours 10 minutes 15 seconds  Total Procedure Duration: 0 hours 13 minutes 53 seconds  Findings:      The perianal and digital rectal examinations were normal.      A 3 mm polyp was found in the ascending colon. The polyp was  sessile.       The polyp was removed with a cold snare. Resection and retrieval were       complete. Estimated blood loss was minimal.      Internal hemorrhoids were found during retroflexion. The hemorrhoids       were moderate, medium-sized and Grade II (internal hemorrhoids that       prolapse but reduce spontaneously).      The exam was otherwise without abnormality on direct and retroflexion       views. Impression:               - One 3 mm polyp in the ascending colon, removed                            with a cold snare. Resected and retrieved.                           - Internal hemorrhoids.                           - The examination was otherwise normal on direct                            and retroflexion views. Moderate Sedation:      Moderate  (conscious) sedation was personally administered by an       anesthesia professional. The following parameters were monitored: oxygen       saturation, heart rate, blood pressure, respiratory rate, EKG, adequacy       of pulmonary ventilation, and response to care. Recommendation:           - Patient has a contact number available for                            emergencies. The signs and symptoms of potential                            delayed complications were discussed with the                            patient. Return to normal activities tomorrow.                            Written discharge instructions were provided to the                            patient.                           - Advance diet as tolerated.                           - Continue present medications. Resume Eliquis                            tomorrow.                           -  Repeat colonoscopy date to be determined after                            pending pathology results are reviewed for                            surveillance.                           - Return to GI office (date not yet determined). Procedure Code(s):        --- Professional ---                           [phone], Colonoscopy, flexible; with removal of                            tumor(s), polyp(s), or other lesion(s) by snare                            technique Diagnosis Code(s):        --- Professional ---                           D12.2, Benign neoplasm of ascending colon                           K64.1, Second degree hemorrhoids                           R19.5, Other fecal abnormalities CPT copyright 2022 American [employer] Association. All rights reserved. The codes documented in this report are preliminary and upon coder review may  be revised to meet current compliance requirements. Gerrit Friends. Jalah Warmuth, MD Gennette Pac, MD 05/23/2023 2:45:36 PM This report has been signed electronically. Number of Addenda: 0

## 2023-05-23 NOTE — Transfer of Care (Signed)
Immediate Anesthesia Transfer of Care Note  Patient: Michael Melton  Procedure(s) Performed: COLONOSCOPY WITH PROPOFOL POLYPECTOMY  Patient Location: Short Stay  Anesthesia Type:General  Level of Consciousness: awake, alert , and oriented  Airway & Oxygen Therapy: Patient Spontanous Breathing  Post-op Assessment: Report given to RN, Post -op Vital signs reviewed and stable, Patient moving all extremities X 4, and Patient able to stick tongue midline  Post vital signs: Reviewed and stable  Last Vitals:  Vitals Value Taken Time  BP 108/62 05/23/23 1420  Temp 37.6 C 05/23/23 1420  Pulse 76 05/23/23 1420  Resp 22 05/23/23 1420  SpO2      Last Pain:  Vitals:   05/23/23 1420  TempSrc: Axillary  PainSc: 0-No pain         Complications: No notable events documented.

## 2023-05-23 NOTE — Anesthesia Preprocedure Evaluation (Addendum)
Anesthesia Evaluation  Patient identified by MRN, date of birth, ID band Patient awake    Reviewed: Allergy & Precautions, H&P , NPO status , Patient's Chart, lab work & pertinent test results, reviewed documented beta blocker date and time   Airway Mallampati: II  TM Distance: >3 FB Neck ROM: full    Dental no notable dental hx. (+) Teeth Intact, Dental Advidsory Given   Pulmonary neg pulmonary ROS   Pulmonary exam normal breath sounds clear to auscultation       Cardiovascular Exercise Tolerance: Good hypertension, + CAD and + Past MI  Normal cardiovascular exam+ dysrhythmias Atrial Fibrillation + pacemaker  Rhythm:regular Rate:Normal     Neuro/Psych negative neurological ROS  negative psych ROS   GI/Hepatic negative GI ROS, Neg liver ROS,,,  Endo/Other  negative endocrine ROS    Renal/GU negative Renal ROS  negative genitourinary   Musculoskeletal   Abdominal   Peds  Hematology negative hematology ROS (+)   Anesthesia Other Findings CAD, h/o Afib, paced with MI hx H/OCABG,   Reproductive/Obstetrics negative OB ROS                             Anesthesia Physical Anesthesia Plan  ASA: 3  Anesthesia Plan: General   Post-op Pain Management: Minimal or no pain anticipated   Induction: Intravenous  PONV Risk Score and Plan:   Airway Management Planned: Natural Airway and Nasal Cannula  Additional Equipment: None  Intra-op Plan:   Post-operative Plan:   Informed Consent: I have reviewed the patients History and Physical, chart, labs and discussed the procedure including the risks, benefits and alternatives for the proposed anesthesia with the patient or authorized representative who has indicated his/her understanding and acceptance.     Dental Advisory Given  Plan Discussed with: Anesthesiologist  Anesthesia Plan Comments:         Anesthesia Quick Evaluation

## 2023-05-23 NOTE — Discharge Instructions (Addendum)
  Colonoscopy Discharge Instructions  Read the instructions outlined below and refer to this sheet in the next few weeks. These discharge instructions provide you with general information on caring for yourself after you leave the hospital. Your doctor may also give you specific instructions. While your treatment has been planned according to the most current medical practices available, unavoidable complications occasionally occur. If you have any problems or questions after discharge, call Dr. Jena Gauss at [phone]. ACTIVITY You may resume your regular activity, but move at a slower pace for the next 24 hours.  Take frequent rest periods for the next 24 hours.  Walking will help get rid of the air and reduce the bloated feeling in your belly (abdomen).  No driving for 24 hours (because of the medicine (anesthesia) used during the test).   Do not sign any important legal documents or operate any machinery for 24 hours (because of the anesthesia used during the test).  NUTRITION Drink plenty of fluids.  You may resume your normal diet as instructed by your doctor.  Begin with a light meal and progress to your normal diet. Heavy or fried foods are harder to digest and may make you feel sick to your stomach (nauseated).  Avoid alcoholic beverages for 24 hours or as instructed.  MEDICATIONS You may resume your normal medications unless your doctor tells you otherwise.  WHAT YOU CAN EXPECT TODAY Some feelings of bloating in the abdomen.  Passage of more gas than usual.  Spotting of blood in your stool or on the toilet paper.  IF YOU HAD POLYPS REMOVED DURING THE COLONOSCOPY: No aspirin products for 7 days or as instructed.  No alcohol for 7 days or as instructed.  Eat a soft diet for the next 24 hours.  FINDING OUT THE RESULTS OF YOUR TEST Not all test results are available during your visit. If your test results are not back during the visit, make an appointment with your caregiver to find out the  results. Do not assume everything is normal if you have not heard from your caregiver or the medical facility. It is important for you to follow up on all of your test results.  SEEK IMMEDIATE MEDICAL ATTENTION IF: You have more than a spotting of blood in your stool.  Your belly is swollen (abdominal distention).  You are nauseated or vomiting.  You have a temperature over 101.  You have abdominal pain or discomfort that is severe or gets worse throughout the day.      1 small polyp and hemorrhoids found.    Polyp was removed.    Recommend Eliquis be resumed tomorrow.    Further recommendations to follow pending review of pathology report   at patient request I called Cordelia Pen at [phone] -  call rolled to voicemail.  Left a message.

## 2023-05-23 NOTE — H&P (Signed)
@LOGO @   Primary Care Physician:  Estanislado Pandy, MD Primary Gastroenterologist:  Dr. Jena Gauss  Pre-Procedure History & Physical: HPI:  Michael Melton is a 73 y.o. male here for diagnostic colonoscopy.  History occult blood positive stool no overt bleeding.  Previous colonoscopy many years ago.  On Xarelto for atrial fibrillation.  Past Medical History:  Diagnosis Date   AF (atrial fibrillation) (HCC) 12/29/2017   CAD in native artery    08/05/16 Cath, atresia of LIMA to LAD, occlusion of SVG-->RCA, SVG-->Circumflex/OM2, SVG-->OM1, patent SVG to diag, DES to native circumflex.    Dysrhythmia    Erectile dysfunction    Hyperlipidemia, mixed    Myocardial infarction [facility]) 2001   Out of hospital myocardial infarction/inferior wall, status post Cardiolite study 2007.  Ejection fraction 45%   Pacemaker 12/2017   Unspecified essential hypertension     Past Surgical History:  Procedure Laterality Date   BIV PACEMAKER INSERTION CRT-P N/A 12/25/2017   Procedure: BIV PACEMAKER INSERTION CRT-P;  Surgeon: Marinus Maw, MD;  Location: [facility] INVASIVE CV LAB;  Service: Cardiovascular;  Laterality: N/A;   CARDIOVERSION N/A 02/04/2018   Procedure: CARDIOVERSION;  Surgeon: Antoine Poche, MD;  Location: [facility] ENDO SUITE;  Service: Endoscopy;  Laterality: N/A;   CATARACT EXTRACTION     CORONARY ARTERY BYPASS GRAFT  2001   2001.  LIMA to LAD, SVG to OM1, SVG to OM 2, SVG to diagonal, SVG to PDA.   CORONARY BALLOON ANGIOPLASTY N/A 08/05/2016   Procedure: Coronary Balloon Angioplasty;  Surgeon: Corky Crafts, MD;  Location: [facility] INVASIVE CV LAB;  Service: Cardiovascular;  Laterality: N/A;  CFX   CORONARY STENT INTERVENTION N/A 08/05/2016   Procedure: Coronary Stent Intervention;  Surgeon: Corky Crafts, MD;  Location: [facility] INVASIVE CV LAB;  Service: Cardiovascular;  Laterality: N/A;  OM   LAPAROSCOPIC CHOLECYSTECTOMY  06/22/2013   Dr. Cristy Folks   LEFT HEART CATH AND CORS/GRAFTS ANGIOGRAPHY N/A 08/05/2016    Procedure: Left Heart Cath and Cors/Grafts Angiography;  Surgeon: Corky Crafts, MD;  Location: [facility] INVASIVE CV LAB;  Service: Cardiovascular;  Laterality: N/A;    Prior to Admission medications   Medication Sig Start Date End Date Taking? Authorizing Provider  atorvastatin (LIPITOR) 80 MG tablet TAKE 1 TABLET(80 MG) BY MOUTH DAILY 02/28/23  Yes Branch, Dorothe Pea, MD  metoprolol (TOPROL-XL) 200 MG 24 hr tablet TAKE 1 TABLET(200 MG) BY MOUTH DAILY 05/21/22  Yes Sharlene Dory, NP  pantoprazole (PROTONIX) 40 MG tablet TAKE 1 TABLET BY MOUTH EVERY DAY 12/16/22  Yes Antoine Poche, MD  sacubitril-valsartan (ENTRESTO) 97-103 MG TAKE 1 TABLET BY MOUTH TWICE DAILY 10/23/22  Yes Antoine Poche, MD  spironolactone (ALDACTONE) 25 MG tablet TAKE 1/2 TABLET BY MOUTH EVERY DAY 12/16/22  Yes Branch, Dorothe Pea, MD  XARELTO 20 MG TABS tablet TAKE 1 TABLET BY MOUTH EVERY DAY 03/17/23  Yes Branch, Dorothe Pea, MD  clonazePAM (KLONOPIN) 0.5 MG tablet Take 0.5 mg by mouth at bedtime as needed for anxiety. Patient not taking: Reported on 04/10/2023    [provider]  nitroGLYCERIN (NITROSTAT) 0.4 MG SL tablet Place 0.4 mg under the tongue every 5 (five) minutes as needed for chest pain.    [provider]  sildenafil (REVATIO) 20 MG tablet Take 20-100 mg by mouth as needed (30 minutes prior to sexual activity).  12/13/17   [provider]    Allergies as of 04/16/2023 - Review Complete 04/10/2023  Allergen Reaction Noted  Acetaminophen Other (See Comments)     Family History  Problem Relation Age of Onset   Alzheimer's disease Mother    Brain cancer Mother        brain tumor   Stroke Father    Alzheimer's disease Father     Social History   Socioeconomic History   Marital status: Married    Spouse name: Not on file   Number of children: Not on file   Years of education: Not on file   Highest education level: Not on file  Occupational History   Occupation:  Full time    Employer: [employer]  Tobacco Use   Smoking status: Never   Smokeless tobacco: Never  Vaping Use   Vaping status: Never Used  Substance and Sexual Activity   Alcohol use: Yes    Alcohol/week: 0.0 standard drinks of alcohol    Comment: RARE   Drug use: No   Sexual activity: Not on file  Other Topics Concern   Not on file  Social History Narrative   Not on file   Social Drivers of Health   Financial Resource Strain: Not on file  Food Insecurity: Not on file  Transportation Needs: Not on file  Physical Activity: Not on file  Stress: Not on file  Social Connections: Unknown (09/16/2021)   Received from [facility], [facility]   Social Network    Social Network: Not on file  Intimate Partner Violence: Unknown (08/08/2021)   Received from [employer], [facility]   HITS    Physically Hurt: Not on file    Insult or Talk Down To: Not on file    Threaten Physical Harm: Not on file    Scream or Curse: Not on file    Review of Systems: See HPI, otherwise negative ROS  Physical Exam: BP 135/80   Pulse 72   Temp 98.2 F (36.8 C) (Oral)   Resp 17   Ht 6' (1.829 m)   Wt 104.3 kg   SpO2 99%   BMI 31.19 kg/m  General:   Alert,  Well-developed, well-nourished, pleasant and cooperative in NAD Neck:  Supple; no masses or thyromegaly. No significant cervical adenopathy. Lungs:  Clear throughout to auscultation.   No wheezes, crackles, or rhonchi. No acute distress. Heart:  Regular rate and rhythm; no murmurs, clicks, rubs,  or gallops. Abdomen: Non-distended, normal bowel sounds.  Soft and nontender without appreciable mass or hepatosplenomegaly.   Impression/Plan: 73 year old gentleman referred by Dr. Neita Carp for occult blood in stool.  Diagnostic colonoscopy today per plan. The risks, benefits, limitations, alternatives and imponderables have been reviewed with the patient. Questions have been answered. All parties are agreeable.    Xarelto held 2 days  ago.   Notice: This dictation was prepared with Dragon dictation along with smaller phrase technology. Any transcriptional errors that result from this process are unintentional and may not be corrected upon review.

## 2023-05-27 ENCOUNTER — Encounter (HOSPITAL_COMMUNITY): Payer: Self-pay | Admitting: Internal Medicine

## 2023-05-27 LAB — SURGICAL PATHOLOGY

## 2023-05-29 ENCOUNTER — Encounter: Payer: Self-pay | Admitting: Internal Medicine

## 2023-06-11 ENCOUNTER — Ambulatory Visit (INDEPENDENT_AMBULATORY_CARE_PROVIDER_SITE_OTHER): Payer: Medicare PPO

## 2023-06-11 DIAGNOSIS — I442 Atrioventricular block, complete: Secondary | ICD-10-CM | POA: Diagnosis not present

## 2023-06-11 LAB — CUP PACEART REMOTE DEVICE CHECK
Battery Remaining Longevity: 23 mo
Battery Remaining Percentage: 23 %
Battery Voltage: 2.9 V
Brady Statistic AP VP Percent: 0 %
Brady Statistic AP VS Percent: 0 %
Brady Statistic AS VP Percent: 0 %
Brady Statistic AS VS Percent: 0 %
Brady Statistic RA Percent Paced: 1 %
Date Time Interrogation Session: 20250205023724
Implantable Lead Connection Status: 753985
Implantable Lead Connection Status: 753985
Implantable Lead Connection Status: 753985
Implantable Lead Implant Date: 20190822
Implantable Lead Implant Date: 20190822
Implantable Lead Implant Date: 20190822
Implantable Lead Location: 753858
Implantable Lead Location: 753859
Implantable Lead Location: 753860
Implantable Pulse Generator Implant Date: 20190822
Lead Channel Impedance Value: 440 Ohm
Lead Channel Impedance Value: 530 Ohm
Lead Channel Impedance Value: 560 Ohm
Lead Channel Pacing Threshold Amplitude: 0.625 V
Lead Channel Pacing Threshold Amplitude: 0.625 V
Lead Channel Pacing Threshold Pulse Width: 0.5 ms
Lead Channel Pacing Threshold Pulse Width: 0.5 ms
Lead Channel Sensing Intrinsic Amplitude: 10.4 mV
Lead Channel Sensing Intrinsic Amplitude: 2.1 mV
Lead Channel Setting Pacing Amplitude: 2 V
Lead Channel Setting Pacing Amplitude: 2 V
Lead Channel Setting Pacing Amplitude: 2.5 V
Lead Channel Setting Pacing Pulse Width: 0.5 ms
Lead Channel Setting Pacing Pulse Width: 0.5 ms
Lead Channel Setting Sensing Sensitivity: 4 mV
Pulse Gen Model: 3562
Pulse Gen Serial Number: 9056721

## 2023-06-20 ENCOUNTER — Other Ambulatory Visit: Payer: Self-pay | Admitting: Cardiology

## 2023-06-21 ENCOUNTER — Other Ambulatory Visit: Payer: Self-pay | Admitting: Cardiology

## 2023-07-03 ENCOUNTER — Encounter: Payer: Self-pay | Admitting: *Deleted

## 2023-07-03 ENCOUNTER — Encounter: Payer: Self-pay | Admitting: Cardiology

## 2023-07-03 ENCOUNTER — Ambulatory Visit: Payer: Medicare PPO | Attending: Cardiology | Admitting: Cardiology

## 2023-07-03 ENCOUNTER — Other Ambulatory Visit: Payer: Self-pay | Admitting: *Deleted

## 2023-07-03 VITALS — BP 124/86 | HR 94 | Ht 74.0 in | Wt 230.0 lb

## 2023-07-03 DIAGNOSIS — I5022 Chronic systolic (congestive) heart failure: Secondary | ICD-10-CM

## 2023-07-03 DIAGNOSIS — I4891 Unspecified atrial fibrillation: Secondary | ICD-10-CM | POA: Diagnosis not present

## 2023-07-03 DIAGNOSIS — Z79899 Other long term (current) drug therapy: Secondary | ICD-10-CM

## 2023-07-03 DIAGNOSIS — I10 Essential (primary) hypertension: Secondary | ICD-10-CM

## 2023-07-03 DIAGNOSIS — I2510 Atherosclerotic heart disease of native coronary artery without angina pectoris: Secondary | ICD-10-CM

## 2023-07-03 DIAGNOSIS — Z950 Presence of cardiac pacemaker: Secondary | ICD-10-CM | POA: Diagnosis not present

## 2023-07-03 DIAGNOSIS — E782 Mixed hyperlipidemia: Secondary | ICD-10-CM

## 2023-07-03 DIAGNOSIS — I442 Atrioventricular block, complete: Secondary | ICD-10-CM

## 2023-07-03 NOTE — Patient Instructions (Addendum)
 Medication Instructions:  Continue all current medications.   Labwork: none  Testing/Procedures: none  Follow-Up: 6 months   Any Other Special Instructions Will Be Listed Below (If Applicable).   If you need a refill on your cardiac medications before your next appointment, please call your pharmacy.

## 2023-07-03 NOTE — Progress Notes (Signed)
 Clinical Summary Michael Melton is a 73 y.o.male seen today for follow up of the following medical problems.      1. CAD/ICM/Chronic systolic HF - hx of CABG in 2001, at [facility].   - GXT 09/2012 with no ischemic changes, did have frequent PVCs, 3 beat run of NSVT - echo 01/2013 LVEF 50%, grade I diastoilc dysfunction.  - echo 07/2016 LVEF 40%, grade I diastolic dysfunction - 06/2016 nuclear stress primarily scar with mild peri-infarct ischemia. LVEF 40%, intermediate risk.      - cath 08/2016: mid LAD occluded, mid LCX 80%, RCA 100%. OM 1 80%. OM2 99%. SVG-RPDA occluded, LIMA-LAD atretic and occluded in midportion, LAD fills from SVG-diag. SVG-D2 patent, RIMA-OM1 occluded, SVG-OM2 occluded. RCA territory fills by collaterals Received angiolpasty to LCX, OM1 80% receivded DES. Recs for indefinite plavix.     03/2018 echo LVEF 25-30%, diffuse hypokinesis, grade II diastoilc dysfunction, mild MR   10/2018 echo LVEF 40-45%   04/2022 echo LVEF 40-45%,     - no SOB/DOE, no chest pains - compliant with meds     2. Symptomatic bradycardia - s/p pacemaker placement 12/2017, [employer] BiV pacemaker for complete AV block. At time of implant LVEF was 40%     06/2023: normal device check - no symptoms.      3. Afib - new diagnosis during 12/2017 admission  - s/p DCCV 02/04/18. Returned back to afib. Plan for rate control strategy   - denies any palpitations - no bleeding on xarelto     4. Hyperlipidemia   02/2020 TC 123 TG 92 HDL 37 LDL 68 - he remains compliant with atorvastatin   04/2022 TC 126 TG 96 HDL 35 LDL 72 - has not been interested in more potent statin   5.  HTN -he is compliant with meds Past Medical History:  Diagnosis Date   AF (atrial fibrillation) (HCC) 12/29/2017   CAD in native artery    08/05/16 Cath, atresia of LIMA to LAD, occlusion of SVG-->RCA, SVG-->Circumflex/OM2, SVG-->OM1, patent SVG to diag, DES to native circumflex.    Dysrhythmia    Erectile  dysfunction    Hyperlipidemia, mixed    Myocardial infarction [facility]) 2001   Out of hospital myocardial infarction/inferior wall, status post Cardiolite study 2007.  Ejection fraction 45%   Pacemaker 12/2017   Unspecified essential hypertension      Allergies  Allergen Reactions   Acetaminophen Other (See Comments)    Hallucinations     Current Outpatient Medications  Medication Sig Dispense Refill   atorvastatin (LIPITOR) 80 MG tablet TAKE 1 TABLET(80 MG) BY MOUTH DAILY 60 tablet 3   clonazePAM (KLONOPIN) 0.5 MG tablet Take 0.5 mg by mouth at bedtime as needed for anxiety. (Patient not taking: Reported on 04/10/2023)     metoprolol (TOPROL-XL) 200 MG 24 hr tablet TAKE 1 TABLET(200 MG) BY MOUTH DAILY 90 tablet 3   nitroGLYCERIN (NITROSTAT) 0.4 MG SL tablet Place 0.4 mg under the tongue every 5 (five) minutes as needed for chest pain.     pantoprazole (PROTONIX) 40 MG tablet TAKE 1 TABLET BY MOUTH EVERY DAY 90 tablet 1   sacubitril-valsartan (ENTRESTO) 97-103 MG TAKE 1 TABLET BY MOUTH TWICE DAILY 180 tablet 3   sildenafil (REVATIO) 20 MG tablet Take 20-100 mg by mouth as needed (30 minutes prior to sexual activity).   6   spironolactone (ALDACTONE) 25 MG tablet TAKE 1/2 TABLET BY MOUTH EVERY DAY 45 tablet 1  XARELTO 20 MG TABS tablet TAKE 1 TABLET BY MOUTH EVERY DAY 90 tablet 1   No current facility-administered medications for this visit.     Past Surgical History:  Procedure Laterality Date   BIV PACEMAKER INSERTION CRT-P N/A 12/25/2017   Procedure: BIV PACEMAKER INSERTION CRT-P;  Surgeon: Marinus Maw, MD;  Location: [facility] INVASIVE CV LAB;  Service: Cardiovascular;  Laterality: N/A;   CARDIOVERSION N/A 02/04/2018   Procedure: CARDIOVERSION;  Surgeon: Antoine Poche, MD;  Location: [facility] ENDO SUITE;  Service: Endoscopy;  Laterality: N/A;   CATARACT EXTRACTION     COLONOSCOPY WITH PROPOFOL N/A 05/23/2023   Procedure: COLONOSCOPY WITH PROPOFOL;  Surgeon: Corbin Ade, MD;   Location: [facility] ENDO SUITE;  Service: Endoscopy;  Laterality: N/A;  1:45 pm, asa 3   CORONARY ARTERY BYPASS GRAFT  2001   2001.  LIMA to LAD, SVG to OM1, SVG to OM 2, SVG to diagonal, SVG to PDA.   CORONARY BALLOON ANGIOPLASTY N/A 08/05/2016   Procedure: Coronary Balloon Angioplasty;  Surgeon: Corky Crafts, MD;  Location: [facility] INVASIVE CV LAB;  Service: Cardiovascular;  Laterality: N/A;  CFX   CORONARY STENT INTERVENTION N/A 08/05/2016   Procedure: Coronary Stent Intervention;  Surgeon: Corky Crafts, MD;  Location: [facility] INVASIVE CV LAB;  Service: Cardiovascular;  Laterality: N/A;  OM   LAPAROSCOPIC CHOLECYSTECTOMY  06/22/2013   Dr. Cristy Folks   LEFT HEART CATH AND CORS/GRAFTS ANGIOGRAPHY N/A 08/05/2016   Procedure: Left Heart Cath and Cors/Grafts Angiography;  Surgeon: Corky Crafts, MD;  Location: [facility] INVASIVE CV LAB;  Service: Cardiovascular;  Laterality: N/A;   POLYPECTOMY  05/23/2023   Procedure: POLYPECTOMY;  Surgeon: Corbin Ade, MD;  Location: [facility] ENDO [facility];  Service: Endoscopy;;     Allergies  Allergen Reactions   Acetaminophen Other (See Comments)    Hallucinations      Family History  Problem Relation Age of Onset   Alzheimer's disease Mother    Brain cancer Mother        brain tumor   Stroke Father    Alzheimer's disease Father      Social History Mr. Sharman reports that he has never smoked. He has never used smokeless tobacco. Mr. Dunne reports current alcohol use.    Physical Examination Today's Vitals   07/03/23 0859  BP: 124/86  Pulse: 94  SpO2: 97%  Weight: 230 lb (104.3 kg)  Height: 6\' 2"  (1.88 m)   Body mass index is 29.53 kg/m.  Gen: resting comfortably, no acute distress HEENT: no scleral icterus, pupils equal round and reactive, no palptable cervical adenopathy,  CV: RRR, no m/rg, no jvd Resp: Clear to auscultation bilaterally GI: abdomen is soft, non-tender, non-distended, normal bowel sounds, no hepatosplenomegaly MSK: extremities  are warm, no edema.  Skin: warm, no rash Neuro:  no focal deficits Psych: appropriate affect   Diagnostic Studies  01/2013 Echo Study Conclusions  - Left ventricle: Systolic function is low normal, with an estimated EF of 50-55%, but closer to 50%. There is mild global hypokinesis. The cavity size was normal. Wall thickness was increased in a pattern of mild LVH. There was an increased relative contribution of atrial contraction to ventricular filling. Doppler parameters are consistent with abnormal left ventricular relaxation (grade 1 diastolic dysfunction). - Ventricular septum: Motion consistent with post-operative changes. The contour showed diastolic flattening. - Left atrium: The atrium was mildly dilated.      09/2012 ETT Exc 6 min 50 sec, 10.1 METs. No  specific ischemic changes, frequent PVCs and PACs, 3 beat run of NSVT.    06/2016 Nuclear stress test ECG nondiagnostic due to IVCD. No arrhythmias noted during exercise. Blood pressure demonstrated a hypertensive response to exercise. Medium, moderate intensity, inferior/inferolateral defect extended from apex to base. There is a partially reversible zone in the inferolateral wall at the base. Most suggestive of scar with mild peri-infarct ischemia. This is an intermediate risk study. Nuclear stress EF: 40%. Consider confirmation with echocardiography.     07/2016 echo Study Conclusions   - Left ventricle: The cavity size was at the upper limits of   normal. Wall thickness was increased in a pattern of mild LVH.   The estimated ejection fraction was 40%. Diffuse hypokinesis.   There is akinesis of the basalinferior myocardium. Doppler   parameters are consistent with abnormal left ventricular   relaxation (grade 1 diastolic dysfunction). - Aortic valve: Mildly calcified annulus. Trileaflet. - Mitral valve: Mildly thickened leaflets . There was mild   regurgitation. - Left atrium: The atrium was mildly dilated. -  Tricuspid valve: There was trivial regurgitation. - Pulmonary arteries: Systolic pressure could not be accurately   estimated. - Pericardium, extracardiac: There was no pericardial effusion.   Impressions:   - Mild LVH with upper normal chamber size and LVEF approximately   40%. There is diffuse hypokinesis with inferior basal akinesis.   Probable grade 1 diastolic dysfunction. Mild left atrial   enlargement. Mildly thickened mitral leaflets with mild mitral   regurgitation.     08/2016 cath Severe native three-vessel coronary artery disease. Mid RCA lesion, 100 %stenosed. SVG to PDA is occluded. There are right to right and left to right collaterals. Mid LAD lesion, 100 %stenosed. LIMA to LAD is atretic. LAD fills from flow from the SVG to diagonal filling the LAD. Ost 2nd Mrg to 2nd Mrg lesion, 99 %stenosed. SVG to OM2 is occluded. Ost 1st Mrg to 1st Mrg lesion, 80 %stenosed. Free RIMA graft to OM 1 is occluded. A STENT PROMUS PREM MR 3.5X28 drug eluting stent was successfully placed., postdilated to > 4mm. Post intervention, there is a 0% residual stenosis. Mid Cx lesion, 80 %stenosed. THis was treated with initial cutting balloon angioplasty with a 2.5 mm balloon prior to stent placement in the OM. Then after stent placement, PTCA was done with a 2.0 balloon. Post intervention, there is a 10% residual stenosis. The left ventricular ejection fraction is 25-35% by visual estimate. LV end diastolic pressure is moderately elevated. There is moderate to severe left ventricular systolic dysfunction. There is no aortic valve stenosis.   Severe coronary artery disease. Severe graft disease. Only patent graft is the SVG to diagonal which feeds both the diagonal and the LAD. Successful stenting of a large OM1 and balloon angioplasty of the mid circumflex. The RCA territory fills by right to right and left to right collaterals. Continue aggressive secondary prevention. He'll need dual  antiplatelet therapy for at least a year. Consider clopidogrel longer-term given the extent of his disease.     10/2018 echo IMPRESSIONS      1. The left ventricle has mild-moderately reduced systolic function, with an ejection fraction of 40-45%. The cavity size was normal. There is mildly increased left ventricular wall thickness. Left ventricular diastolic Doppler parameters are  indeterminate.  2. Left atrial size was severely dilated.  3. Right atrial size was moderately dilated.  4. The aortic valve is tricuspid. Mild thickening of the aortic valve. Mild calcification of the  aortic valve. No stenosis of the aortic valve. Mild aortic annular calcification noted.  5. The mitral valve is abnormal. Mild thickening of the mitral valve leaflet. Mild calcification of the mitral valve leaflet. There is mild mitral annular calcification present. No evidence of mitral valve stenosis.  6. The aortic root is normal in size and structure.  7. Pulmonary hypertension is indeterminant, inadequate TR jet.  8. The interatrial septum was not well visualized.           Assessment and Plan   1. CAD/Chronic systolic HF/ICM - he is not interested in farxiga at this time due to cost - no symptoms, continue current meds   2. Afib/acquired thrombophilia -denies any symptoms, contineu xarelto for stroke prevention   3. Bradycardia/Pacemaker - recent normal device check - no symptoms - continue to monitor   4.  HTN - his bp is at goal, continue current meds   5. Hyperlipidemia - would aim for LDL goal of <55. Most recent LDL was 72. Checking with pcp if more recent lipid panel - he has not favored more potent statin, remains on atorva 80mg  daily  F/u 6 months     Antoine Poche, M.D.

## 2023-07-11 DIAGNOSIS — E782 Mixed hyperlipidemia: Secondary | ICD-10-CM | POA: Diagnosis not present

## 2023-07-11 DIAGNOSIS — E7849 Other hyperlipidemia: Secondary | ICD-10-CM | POA: Diagnosis not present

## 2023-07-17 NOTE — Progress Notes (Signed)
 Remote pacemaker transmission.

## 2023-08-08 ENCOUNTER — Telehealth: Payer: Self-pay | Admitting: Cardiology

## 2023-08-08 NOTE — Telephone Encounter (Signed)
 Pt c/o medication issue:  1. Name of Medication:   Dramamine  2. How are you currently taking this medication (dosage and times per day)?   3. Are you having a reaction (difficulty breathing--STAT)?   4. What is your medication issue?    Patient stated he will be going on a trip and wants to know if it is safe for him to take dramamine.

## 2023-08-08 NOTE — Telephone Encounter (Signed)
 Patient informed and verbalized understanding of plan.

## 2023-08-11 ENCOUNTER — Other Ambulatory Visit: Payer: Self-pay | Admitting: Cardiology

## 2023-09-10 ENCOUNTER — Ambulatory Visit (INDEPENDENT_AMBULATORY_CARE_PROVIDER_SITE_OTHER): Payer: Medicare PPO

## 2023-09-10 ENCOUNTER — Encounter: Payer: Self-pay | Admitting: Internal Medicine

## 2023-09-10 DIAGNOSIS — I442 Atrioventricular block, complete: Secondary | ICD-10-CM | POA: Diagnosis not present

## 2023-09-10 LAB — CUP PACEART REMOTE DEVICE CHECK
Battery Remaining Longevity: 19 mo
Battery Remaining Percentage: 19 %
Battery Voltage: 2.89 V
Brady Statistic AP VP Percent: 0 %
Brady Statistic AP VS Percent: 0 %
Brady Statistic AS VP Percent: 0 %
Brady Statistic AS VS Percent: 0 %
Brady Statistic RA Percent Paced: 1 %
Date Time Interrogation Session: 20250507020010
Implantable Lead Connection Status: 753985
Implantable Lead Connection Status: 753985
Implantable Lead Connection Status: 753985
Implantable Lead Implant Date: 20190822
Implantable Lead Implant Date: 20190822
Implantable Lead Implant Date: 20190822
Implantable Lead Location: 753858
Implantable Lead Location: 753859
Implantable Lead Location: 753860
Implantable Pulse Generator Implant Date: 20190822
Lead Channel Impedance Value: 430 Ohm
Lead Channel Impedance Value: 530 Ohm
Lead Channel Impedance Value: 590 Ohm
Lead Channel Pacing Threshold Amplitude: 0.625 V
Lead Channel Pacing Threshold Amplitude: 0.75 V
Lead Channel Pacing Threshold Pulse Width: 0.5 ms
Lead Channel Pacing Threshold Pulse Width: 0.5 ms
Lead Channel Sensing Intrinsic Amplitude: 2.1 mV
Lead Channel Sensing Intrinsic Amplitude: 7 mV
Lead Channel Setting Pacing Amplitude: 2 V
Lead Channel Setting Pacing Amplitude: 2 V
Lead Channel Setting Pacing Amplitude: 2.5 V
Lead Channel Setting Pacing Pulse Width: 0.5 ms
Lead Channel Setting Pacing Pulse Width: 0.5 ms
Lead Channel Setting Sensing Sensitivity: 4 mV
Pulse Gen Model: 3562
Pulse Gen Serial Number: 9056721

## 2023-10-21 NOTE — Progress Notes (Signed)
 Remote pacemaker transmission.

## 2023-11-04 DIAGNOSIS — H26491 Other secondary cataract, right eye: Secondary | ICD-10-CM | POA: Diagnosis not present

## 2023-11-04 DIAGNOSIS — Z961 Presence of intraocular lens: Secondary | ICD-10-CM | POA: Diagnosis not present

## 2023-11-20 ENCOUNTER — Other Ambulatory Visit: Payer: Self-pay | Admitting: Cardiology

## 2023-12-10 ENCOUNTER — Ambulatory Visit (INDEPENDENT_AMBULATORY_CARE_PROVIDER_SITE_OTHER)

## 2023-12-10 DIAGNOSIS — I442 Atrioventricular block, complete: Secondary | ICD-10-CM

## 2023-12-12 LAB — CUP PACEART REMOTE DEVICE CHECK
Battery Remaining Longevity: 16 mo
Battery Remaining Percentage: 16 %
Battery Voltage: 2.87 V
Brady Statistic AP VP Percent: 0 %
Brady Statistic AP VS Percent: 0 %
Brady Statistic AS VP Percent: 0 %
Brady Statistic AS VS Percent: 0 %
Brady Statistic RA Percent Paced: 1 %
Date Time Interrogation Session: 20250807230533
Implantable Lead Connection Status: 753985
Implantable Lead Connection Status: 753985
Implantable Lead Connection Status: 753985
Implantable Lead Implant Date: 20190822
Implantable Lead Implant Date: 20190822
Implantable Lead Implant Date: 20190822
Implantable Lead Location: 753858
Implantable Lead Location: 753859
Implantable Lead Location: 753860
Implantable Pulse Generator Implant Date: 20190822
Lead Channel Impedance Value: 440 Ohm
Lead Channel Impedance Value: 490 Ohm
Lead Channel Impedance Value: 580 Ohm
Lead Channel Pacing Threshold Amplitude: 0.625 V
Lead Channel Pacing Threshold Amplitude: 0.75 V
Lead Channel Pacing Threshold Pulse Width: 0.5 ms
Lead Channel Pacing Threshold Pulse Width: 0.5 ms
Lead Channel Sensing Intrinsic Amplitude: 12 mV
Lead Channel Sensing Intrinsic Amplitude: 2.1 mV
Lead Channel Setting Pacing Amplitude: 2 V
Lead Channel Setting Pacing Amplitude: 2 V
Lead Channel Setting Pacing Amplitude: 2.5 V
Lead Channel Setting Pacing Pulse Width: 0.5 ms
Lead Channel Setting Pacing Pulse Width: 0.5 ms
Lead Channel Setting Sensing Sensitivity: 4 mV
Pulse Gen Model: 3562
Pulse Gen Serial Number: 9056721

## 2023-12-13 ENCOUNTER — Ambulatory Visit: Payer: Self-pay | Admitting: Internal Medicine

## 2023-12-17 ENCOUNTER — Other Ambulatory Visit: Payer: Self-pay | Admitting: Cardiology

## 2023-12-17 NOTE — Telephone Encounter (Signed)
 Pt last saw Dr Alvan 07/03/23, last labs 04/14/23 Creat 1.19, age 73, weight 104.3kg, CrCl 84, based on CrCl pt is on appropriate dosage of Xarelto  20mg  every day for afib.  Will refill rx.

## 2023-12-25 ENCOUNTER — Telehealth: Payer: Self-pay | Admitting: Internal Medicine

## 2023-12-25 NOTE — Telephone Encounter (Signed)
 [employer] Alert notification-   Alert remote transmission: HVR Event occurred 8/20 @ 11:27, duration 11sec per device, HR 171, EGM c/w AF, slight ventricular irregularity, PPM indication heart block, > 20 beats - route to triage ___________________________________________________________________________ Patient is now perm AF. CHB dependent with no escape at his last in office check.   Outreach made to patient. Advised him of HVR episode- appears to be NSVT 31 beats- patient states he was most likely having coughing spell at that time. Otherwise, no additional symptoms noted.  Patient states he had a breathing test ~ 2 weeks ago and has developed periods of coughing spells, to the point he feels like he may vomit, since that test was done.   Medications: - Toprol  XL 200 mg once daily  - Xarelto  20 mg once daily   Advised the patient we will forward to Dr. Waddell for awareness and call back if needed with any further MD recommendations.  Michael Melton voices understanding and is agreeable.

## 2024-01-13 ENCOUNTER — Other Ambulatory Visit: Payer: Self-pay | Admitting: Cardiology

## 2024-01-13 NOTE — Telephone Encounter (Signed)
 Has appt with Dr. Branch 10.21.25

## 2024-01-31 ENCOUNTER — Other Ambulatory Visit: Payer: Self-pay | Admitting: Cardiology

## 2024-02-02 NOTE — Progress Notes (Signed)
 Remote PPM Transmission

## 2024-02-02 NOTE — Telephone Encounter (Signed)
 No change in treatment.

## 2024-02-24 ENCOUNTER — Encounter: Payer: Self-pay | Admitting: Cardiology

## 2024-02-24 ENCOUNTER — Ambulatory Visit: Attending: Cardiology | Admitting: Cardiology

## 2024-02-24 VITALS — BP 130/82 | HR 79 | Ht 74.0 in | Wt 227.8 lb

## 2024-02-24 DIAGNOSIS — I5022 Chronic systolic (congestive) heart failure: Secondary | ICD-10-CM

## 2024-02-24 DIAGNOSIS — D6869 Other thrombophilia: Secondary | ICD-10-CM

## 2024-02-24 DIAGNOSIS — I442 Atrioventricular block, complete: Secondary | ICD-10-CM

## 2024-02-24 DIAGNOSIS — I2510 Atherosclerotic heart disease of native coronary artery without angina pectoris: Secondary | ICD-10-CM

## 2024-02-24 DIAGNOSIS — I4891 Unspecified atrial fibrillation: Secondary | ICD-10-CM | POA: Diagnosis not present

## 2024-02-24 DIAGNOSIS — E782 Mixed hyperlipidemia: Secondary | ICD-10-CM

## 2024-02-24 DIAGNOSIS — I10 Essential (primary) hypertension: Secondary | ICD-10-CM

## 2024-02-24 NOTE — Progress Notes (Signed)
 Clinical Summary Michael Melton is a 73 y.o.male seen today for follow up of the following medical problems.      1. CAD/ICM/Chronic systolic HF - hx of CABG in 2001, at [facility].   - GXT 09/2012 with no ischemic changes, did have frequent PVCs, 3 beat run of NSVT - echo 01/2013 LVEF 50%, grade I diastoilc dysfunction.  - echo 07/2016 LVEF 40%, grade I diastolic dysfunction - 06/2016 nuclear stress primarily scar with mild peri-infarct ischemia. LVEF 40%, intermediate risk.      - cath 08/2016: mid LAD occluded, mid LCX 80%, RCA 100%. OM 1 80%. OM2 99%. SVG-RPDA occluded, LIMA-LAD atretic and occluded in midportion, LAD fills from SVG-diag. SVG-D2 patent, RIMA-OM1 occluded, SVG-OM2 occluded. RCA territory fills by collaterals Received angiolpasty to LCX, OM1 80% receivded DES. Recs for indefinite plavix .     03/2018 echo LVEF 25-30%, diffuse hypokinesis, grade II diastoilc dysfunction, mild MR   - 10/2018 echo LVEF 40-45% - 04/2022 echo LVEF 40-45%  - no chest pains, no SOB/DOE - compliant with meds         2. Symptomatic bradycardia - s/p pacemaker placement 12/2017, St Jude BiV pacemaker for complete AV block. At time of implant LVEF was 40%   - no diziziness - 12/2023 normal device check       3. Afib - new diagnosis during 12/2017 admission  - s/p DCCV 02/04/18. Returned back to afib. Plan for rate control strategy   - - no recent palptiations - no bleeding on xarelto      4. Hyperlipidemia - has not been interested in more potent statin - 07/2023 TC 122 TG 87 HDL 35 LDL 70    5.  HTN -compliant with meds   Past Medical History:  Diagnosis Date   AF (atrial fibrillation) (HCC) 12/29/2017   CAD in native artery    08/05/16 Cath, atresia of LIMA to LAD, occlusion of SVG-->RCA, SVG-->Circumflex/OM2, SVG-->OM1, patent SVG to diag, DES to native circumflex.    Dysrhythmia    Erectile dysfunction    Hyperlipidemia, mixed    Myocardial infarction [facility]) 2001   Out of  hospital myocardial infarction/inferior wall, status post Cardiolite study 2007.  Ejection fraction 45%   Pacemaker 12/2017   Unspecified essential hypertension      Allergies  Allergen Reactions   Acetaminophen  Other (See Comments)    Hallucinations     Current Outpatient Medications  Medication Sig Dispense Refill   atorvastatin  (LIPITOR ) 80 MG tablet TAKE 1 TABLET(80 MG) BY MOUTH DAILY 60 tablet 3   clonazePAM (KLONOPIN) 0.5 MG tablet Take 0.5 mg by mouth at bedtime as needed for anxiety.     metoprolol  (TOPROL -XL) 200 MG 24 hr tablet TAKE 1 TABLET(200 MG) BY MOUTH DAILY 90 tablet 3   nitroGLYCERIN  (NITROSTAT ) 0.4 MG SL tablet Place 0.4 mg under the tongue every 5 (five) minutes as needed for chest pain.     pantoprazole  (PROTONIX ) 40 MG tablet TAKE 1 TABLET BY MOUTH EVERY DAY 90 tablet 2   sacubitril -valsartan  (ENTRESTO ) 97-103 MG Take 1 tablet by mouth 2 (two) times daily. 60 tablet 4   sildenafil (REVATIO) 20 MG tablet Take 20-100 mg by mouth as needed (30 minutes prior to sexual activity).   6   spironolactone  (ALDACTONE ) 25 MG tablet TAKE 1/2 TABLET BY MOUTH EVERY DAY 45 tablet 2   XARELTO  20 MG TABS tablet TAKE 1 TABLET BY MOUTH EVERY DAY 90 tablet 1   No  current facility-administered medications for this visit.     Past Surgical History:  Procedure Laterality Date   BIV PACEMAKER INSERTION CRT-P N/A 12/25/2017   Procedure: BIV PACEMAKER INSERTION CRT-P;  Surgeon: Waddell Danelle ORN, MD;  Location: [facility] INVASIVE CV LAB;  Service: Cardiovascular;  Laterality: N/A;   CARDIOVERSION N/A 02/04/2018   Procedure: CARDIOVERSION;  Surgeon: Alvan Dorn FALCON, MD;  [facility];  Service: Endoscopy;  Laterality: N/A;   CATARACT EXTRACTION     COLONOSCOPY WITH PROPOFOL  N/A 05/23/2023   Procedure: COLONOSCOPY WITH PROPOFOL ;  Surgeon: Shaaron Lamar HERO, MD;  Location: [facility];  Service: Endoscopy;  Laterality: N/A;  1:45 pm, asa 3   CORONARY ARTERY BYPASS GRAFT  2001   2001.   LIMA to LAD, SVG to OM1, SVG to OM 2, SVG to diagonal, SVG to PDA.   CORONARY BALLOON ANGIOPLASTY N/A 08/05/2016   Procedure: Coronary Balloon Angioplasty;  Surgeon: Candyce GORMAN Reek, MD;  Location: [facility] INVASIVE CV LAB;  Service: Cardiovascular;  Laterality: N/A;  CFX   CORONARY STENT INTERVENTION N/A 08/05/2016   Procedure: Coronary Stent Intervention;  Surgeon: Candyce GORMAN Reek, MD;  Location: [facility] INVASIVE CV LAB;  Service: Cardiovascular;  Laterality: N/A;  OM   LAPAROSCOPIC CHOLECYSTECTOMY  06/22/2013   Dr. Webster   LEFT HEART CATH AND CORS/GRAFTS ANGIOGRAPHY N/A 08/05/2016   Procedure: Left Heart Cath and Cors/Grafts Angiography;  Surgeon: Candyce GORMAN Reek, MD;  Location: [facility] INVASIVE CV LAB;  Service: Cardiovascular;  Laterality: N/A;   POLYPECTOMY  05/23/2023   Procedure: POLYPECTOMY;  Surgeon: Shaaron Lamar HERO, MD;  Location: [facility];  Service: Endoscopy;;     Allergies  Allergen Reactions   Acetaminophen  Other (See Comments)    Hallucinations      Family History  Problem Relation Age of Onset   Alzheimer's disease Mother    Brain cancer Mother        brain tumor   Stroke Father    Alzheimer's disease Father      Social History Michael Melton reports that he has never smoked. He has never used smokeless tobacco. Michael Melton reports current alcohol use.     Physical Examination Today's Vitals   02/24/24 0838  BP: 130/82  Pulse: 79  SpO2: 100%  Weight: 227 lb 12.8 oz (103.3 kg)  Height: 6' 2 (1.88 m)   Body mass index is 29.25 kg/m.  Gen: resting comfortably, no acute distress HEENT: no scleral icterus, pupils equal round and reactive, no palptable cervical adenopathy,  CV: RRR, no m/rg ,no jvd Resp: Clear to auscultation bilaterally GI: abdomen is soft, non-tender, non-distended, normal bowel sounds, no hepatosplenomegaly MSK: extremities are warm, no edema.  Skin: warm, no rash Neuro:  no focal deficits Psych: appropriate affect   Diagnostic  Studies 01/2013 Echo Study Conclusions  - Left ventricle: Systolic function is low normal, with an estimated EF of 50-55%, but closer to 50%. There is mild global hypokinesis. The cavity size was normal. Wall thickness was increased in a pattern of mild LVH. There was an increased relative contribution of atrial contraction to ventricular filling. Doppler parameters are consistent with abnormal left ventricular relaxation (grade 1 diastolic dysfunction). - Ventricular septum: Motion consistent with post-operative changes. The contour showed diastolic flattening. - Left atrium: The atrium was mildly dilated.      09/2012 ETT Exc 6 min 50 sec, 10.1 METs. No specific ischemic changes, frequent PVCs and PACs, 3 beat run of NSVT.    06/2016  Nuclear stress test ECG nondiagnostic due to IVCD. No arrhythmias noted during exercise. Blood pressure demonstrated a hypertensive response to exercise. Medium, moderate intensity, inferior/inferolateral defect extended from apex to base. There is a partially reversible zone in the inferolateral wall at the base. Most suggestive of scar with mild peri-infarct ischemia. This is an intermediate risk study. Nuclear stress EF: 40%. Consider confirmation with echocardiography.     07/2016 echo Study Conclusions   - Left ventricle: The cavity size was at the upper limits of   normal. Wall thickness was increased in a pattern of mild LVH.   The estimated ejection fraction was 40%. Diffuse hypokinesis.   There is akinesis of the basalinferior myocardium. Doppler   parameters are consistent with abnormal left ventricular   relaxation (grade 1 diastolic dysfunction). - Aortic valve: Mildly calcified annulus. Trileaflet. - Mitral valve: Mildly thickened leaflets . There was mild   regurgitation. - Left atrium: The atrium was mildly dilated. - Tricuspid valve: There was trivial regurgitation. - Pulmonary arteries: Systolic pressure could not be accurately    estimated. - Pericardium, extracardiac: There was no pericardial effusion.   Impressions:   - Mild LVH with upper normal chamber size and LVEF approximately   40%. There is diffuse hypokinesis with inferior basal akinesis.   Probable grade 1 diastolic dysfunction. Mild left atrial   enlargement. Mildly thickened mitral leaflets with mild mitral   regurgitation.     08/2016 cath Severe native three-vessel coronary artery disease. Mid RCA lesion, 100 %stenosed. SVG to PDA is occluded. There are right to right and left to right collaterals. Mid LAD lesion, 100 %stenosed. LIMA to LAD is atretic. LAD fills from flow from the SVG to diagonal filling the LAD. Ost 2nd Mrg to 2nd Mrg lesion, 99 %stenosed. SVG to OM2 is occluded. Ost 1st Mrg to 1st Mrg lesion, 80 %stenosed. Free RIMA graft to OM 1 is occluded. A STENT PROMUS PREM MR [id number] drug eluting stent was successfully placed., postdilated to > 4mm. Post intervention, there is a 0% residual stenosis. Mid Cx lesion, 80 %stenosed. THis was treated with initial cutting balloon angioplasty with a 2.5 mm balloon prior to stent placement in the OM. Then after stent placement, PTCA was done with a 2.0 balloon. Post intervention, there is a 10% residual stenosis. The left ventricular ejection fraction is 25-35% by visual estimate. LV end diastolic pressure is moderately elevated. There is moderate to severe left ventricular systolic dysfunction. There is no aortic valve stenosis.   Severe coronary artery disease. Severe graft disease. Only patent graft is the SVG to diagonal which feeds both the diagonal and the LAD. Successful stenting of a large OM1 and balloon angioplasty of the mid circumflex. The RCA territory fills by right to right and left to right collaterals. Continue aggressive secondary prevention. He'll need dual antiplatelet therapy for at least a year. Consider clopidogrel  longer-term given the extent of his disease.     10/2018  echo IMPRESSIONS      1. The left ventricle has mild-moderately reduced systolic function, with an ejection fraction of 40-45%. The cavity size was normal. There is mildly increased left ventricular wall thickness. Left ventricular diastolic Doppler parameters are  indeterminate.  2. Left atrial size was severely dilated.  3. Right atrial size was moderately dilated.  4. The aortic valve is tricuspid. Mild thickening of the aortic valve. Mild calcification of the aortic valve. No stenosis of the aortic valve. Mild aortic annular calcification noted.  5. The  mitral valve is abnormal. Mild thickening of the mitral valve leaflet. Mild calcification of the mitral valve leaflet. There is mild mitral annular calcification present. No evidence of mitral valve stenosis.  6. The aortic root is normal in size and structure.  7. Pulmonary hypertension is indeterminant, inadequate TR jet.  8. The interatrial septum was not well visualized.    Assessment and Plan   1. CAD/Chronic systolic HF/ICM - has not been nterested in farxiga  due to cost - euvolemic without symptoms, continue current meds   2. Afib/acquired thrombophilia -no symptoms, continue current meds including xarelto  for stroke prevention   3. Bradycardia/Pacemaker - no symptoms, most recent device check showed normal functin - continue to monitor   4.  HTN - bp at goal, continue current meds   5. Hyperlipidemia - he has not favored more potent statin, remains on atorva 80mg  daily - discussed again goal LDL would be <55, he will think over possibly changing to crestor or adding zetia. At this time not ready to commit to a change  F/u 6 months   Dorn PHEBE Ross, M.D.

## 2024-02-24 NOTE — Patient Instructions (Signed)
 Medication Instructions:  Continue all current medications.   Labwork: none  Testing/Procedures: none  Follow-Up: 6 months   Any Other Special Instructions Will Be Listed Below (If Applicable).   If you need a refill on your cardiac medications before your next appointment, please call your pharmacy.

## 2024-03-02 ENCOUNTER — Other Ambulatory Visit: Payer: Self-pay | Admitting: Cardiology

## 2024-03-10 ENCOUNTER — Ambulatory Visit

## 2024-03-10 DIAGNOSIS — I442 Atrioventricular block, complete: Secondary | ICD-10-CM

## 2024-03-11 LAB — CUP PACEART REMOTE DEVICE CHECK
Battery Remaining Longevity: 12 mo
Battery Remaining Percentage: 12 %
Battery Voltage: 2.84 V
Brady Statistic AP VP Percent: 0 %
Brady Statistic AP VS Percent: 0 %
Brady Statistic AS VP Percent: 0 %
Brady Statistic AS VS Percent: 0 %
Brady Statistic RA Percent Paced: 1 %
Date Time Interrogation Session: 20251105020011
Implantable Lead Connection Status: 753985
Implantable Lead Connection Status: 753985
Implantable Lead Connection Status: 753985
Implantable Lead Implant Date: 20190822
Implantable Lead Implant Date: 20190822
Implantable Lead Implant Date: 20190822
Implantable Lead Location: 753858
Implantable Lead Location: 753859
Implantable Lead Location: 753860
Implantable Pulse Generator Implant Date: 20190822
Lead Channel Impedance Value: 440 Ohm
Lead Channel Impedance Value: 540 Ohm
Lead Channel Impedance Value: 600 Ohm
Lead Channel Pacing Threshold Amplitude: 0.75 V
Lead Channel Pacing Threshold Amplitude: 0.75 V
Lead Channel Pacing Threshold Pulse Width: 0.5 ms
Lead Channel Pacing Threshold Pulse Width: 0.5 ms
Lead Channel Sensing Intrinsic Amplitude: 11.9 mV
Lead Channel Sensing Intrinsic Amplitude: 2.1 mV
Lead Channel Setting Pacing Amplitude: 2 V
Lead Channel Setting Pacing Amplitude: 2 V
Lead Channel Setting Pacing Amplitude: 2.5 V
Lead Channel Setting Pacing Pulse Width: 0.5 ms
Lead Channel Setting Pacing Pulse Width: 0.5 ms
Lead Channel Setting Sensing Sensitivity: 4 mV
Pulse Gen Model: 3562
Pulse Gen Serial Number: 9056721

## 2024-03-12 ENCOUNTER — Ambulatory Visit: Payer: Self-pay | Admitting: Internal Medicine

## 2024-03-12 NOTE — Progress Notes (Signed)
 Remote PPM Transmission

## 2024-03-29 DIAGNOSIS — D485 Neoplasm of uncertain behavior of skin: Secondary | ICD-10-CM | POA: Diagnosis not present

## 2024-03-29 DIAGNOSIS — D0439 Carcinoma in situ of skin of other parts of face: Secondary | ICD-10-CM | POA: Diagnosis not present

## 2024-03-29 DIAGNOSIS — D044 Carcinoma in situ of skin of scalp and neck: Secondary | ICD-10-CM | POA: Diagnosis not present

## 2024-03-29 DIAGNOSIS — L570 Actinic keratosis: Secondary | ICD-10-CM | POA: Diagnosis not present

## 2024-04-27 ENCOUNTER — Telehealth: Payer: Self-pay

## 2024-04-27 NOTE — Telephone Encounter (Signed)
 Alert received:  Alert remote transmission: LV pacing lead impedance out of range, >3000 ohms LV threshold at high output - route to triage  Follow up scheduled with Dr. Waddell May 03, 2024.  Will evaluate at that appointment.

## 2024-05-03 ENCOUNTER — Ambulatory Visit: Attending: Internal Medicine | Admitting: Internal Medicine

## 2024-05-03 ENCOUNTER — Telehealth: Payer: Self-pay

## 2024-05-03 VITALS — BP 136/82 | HR 70 | Ht 74.0 in | Wt 228.3 lb

## 2024-05-03 DIAGNOSIS — I2510 Atherosclerotic heart disease of native coronary artery without angina pectoris: Secondary | ICD-10-CM | POA: Diagnosis not present

## 2024-05-03 DIAGNOSIS — I5022 Chronic systolic (congestive) heart failure: Secondary | ICD-10-CM

## 2024-05-03 LAB — CUP PACEART INCLINIC DEVICE CHECK
Battery Remaining Longevity: 9 mo
Battery Voltage: 2.84 V
Brady Statistic RA Percent Paced: 0 %
Brady Statistic RV Percent Paced: 98 %
Date Time Interrogation Session: 20251229111143
Implantable Lead Connection Status: 753985
Implantable Lead Connection Status: 753985
Implantable Lead Connection Status: 753985
Implantable Lead Implant Date: 20190822
Implantable Lead Implant Date: 20190822
Implantable Lead Implant Date: 20190822
Implantable Lead Location: 753858
Implantable Lead Location: 753859
Implantable Lead Location: 753860
Implantable Pulse Generator Implant Date: 20190822
Lead Channel Impedance Value: 437.5 Ohm
Lead Channel Impedance Value: 550 Ohm
Lead Channel Impedance Value: 812.5 Ohm
Lead Channel Pacing Threshold Amplitude: 0.75 V
Lead Channel Pacing Threshold Amplitude: 1 V
Lead Channel Pacing Threshold Amplitude: 1 V
Lead Channel Pacing Threshold Pulse Width: 0.5 ms
Lead Channel Pacing Threshold Pulse Width: 1 ms
Lead Channel Pacing Threshold Pulse Width: 1 ms
Lead Channel Sensing Intrinsic Amplitude: 12 mV
Lead Channel Sensing Intrinsic Amplitude: 2.4 mV
Lead Channel Setting Pacing Amplitude: 1.75 V
Lead Channel Setting Pacing Amplitude: 2 V
Lead Channel Setting Pacing Amplitude: 2.5 V
Lead Channel Setting Pacing Pulse Width: 0.5 ms
Lead Channel Setting Pacing Pulse Width: 1 ms
Lead Channel Setting Sensing Sensitivity: 4 mV
Pulse Gen Model: 3562
Pulse Gen Serial Number: 9056721

## 2024-05-03 NOTE — Telephone Encounter (Signed)
 Called pt to schedule BiV-PPM Gen Change with LV lead extraction and RV Instertion.  He is scheduled on 1/5 at 7:30 am.  He will go to [facility] for labs this week. I will send Instruction letter via MyChart.

## 2024-05-03 NOTE — H&P (View-Only) (Signed)
 "     HPI Michael Melton returns today for followup. He is a pleasant 73 yo man with a h/o chronic systolic heart failure and persistent atrial fib, s/p DCCV. He has a remote h/o MI, s/p CABG. He has HTN. In the interim, he notes no palpitations, chest pain or sob or edema. No syncope. He notes that he was lifting some heavy equipment a few weeks ago.  Allergies[1]   Current Outpatient Medications  Medication Sig Dispense Refill   atorvastatin  (LIPITOR ) 80 MG tablet TAKE 1 TABLET(80 MG) BY MOUTH DAILY 60 tablet 3   clonazePAM (KLONOPIN) 0.5 MG tablet Take 0.5 mg by mouth at bedtime as needed for anxiety.     metoprolol  (TOPROL -XL) 200 MG 24 hr tablet TAKE 1 TABLET(200 MG) BY MOUTH DAILY 90 tablet 3   nitroGLYCERIN  (NITROSTAT ) 0.4 MG SL tablet Place 0.4 mg under the tongue every 5 (five) minutes as needed for chest pain.     pantoprazole  (PROTONIX ) 40 MG tablet TAKE 1 TABLET BY MOUTH EVERY DAY 90 tablet 2   sacubitril -valsartan  (ENTRESTO ) 97-103 MG TAKE 1 TABLET BY MOUTH TWICE DAILY 180 tablet 1   sildenafil (REVATIO) 20 MG tablet Take 20-100 mg by mouth as needed (30 minutes prior to sexual activity).   6   spironolactone  (ALDACTONE ) 25 MG tablet TAKE 1/2 TABLET BY MOUTH EVERY DAY 45 tablet 2   XARELTO  20 MG TABS tablet TAKE 1 TABLET BY MOUTH EVERY DAY 90 tablet 1   No current facility-administered medications for this visit.     Past Medical History:  Diagnosis Date   AF (atrial fibrillation) (HCC) 12/29/2017   CAD in native artery    08/05/16 Cath, atresia of LIMA to LAD, occlusion of SVG-->RCA, SVG-->Circumflex/OM2, SVG-->OM1, patent SVG to diag, DES to native circumflex.    Dysrhythmia    Erectile dysfunction    Hyperlipidemia, mixed    Myocardial infarction [facility]) 2001   Out of hospital myocardial infarction/inferior wall, status post Cardiolite study 2007.  Ejection fraction 45%   Pacemaker 12/2017   Unspecified essential hypertension     ROS:   All systems reviewed and  negative except as noted in the HPI.   Past Surgical History:  Procedure Laterality Date   BIV PACEMAKER INSERTION CRT-P N/A 12/25/2017   Procedure: BIV PACEMAKER INSERTION CRT-P;  Surgeon: Waddell Michael ORN, MD;  Location: [facility] INVASIVE CV LAB;  Service: Cardiovascular;  Laterality: N/A;   CARDIOVERSION N/A 02/04/2018   Procedure: CARDIOVERSION;  Surgeon: Alvan Dorn FALCON, MD;  [facility];  Service: Endoscopy;  Laterality: N/A;   CATARACT EXTRACTION     COLONOSCOPY WITH PROPOFOL  N/A 05/23/2023   Procedure: COLONOSCOPY WITH PROPOFOL ;  Surgeon: Shaaron Lamar HERO, MD;  [facility];  Service: Endoscopy;  Laterality: N/A;  1:45 pm, asa 3   CORONARY ARTERY BYPASS GRAFT  2001   2001.  LIMA to LAD, SVG to OM1, SVG to OM 2, SVG to diagonal, SVG to PDA.   CORONARY BALLOON ANGIOPLASTY N/A 08/05/2016   Procedure: Coronary Balloon Angioplasty;  Surgeon: Candyce GORMAN Reek, MD;  Location: [facility] INVASIVE CV LAB;  Service: Cardiovascular;  Laterality: N/A;  CFX   CORONARY STENT INTERVENTION N/A 08/05/2016   Procedure: Coronary Stent Intervention;  Surgeon: Candyce GORMAN Reek, MD;  Location: [facility] INVASIVE CV LAB;  Service: Cardiovascular;  Laterality: N/A;  OM   LAPAROSCOPIC CHOLECYSTECTOMY  06/22/2013   Dr. Webster   LEFT HEART CATH AND CORS/GRAFTS ANGIOGRAPHY N/A 08/05/2016  Procedure: Left Heart Cath and Cors/Grafts Angiography;  Surgeon: Candyce GORMAN Reek, MD;  Location: [facility] INVASIVE CV LAB;  Service: Cardiovascular;  Laterality: N/A;   POLYPECTOMY  05/23/2023   Procedure: POLYPECTOMY;  Surgeon: Shaaron Lamar HERO, MD;  [facility];  Service: Endoscopy;;     Family History  Problem Relation Age of Onset   Alzheimer's disease Mother    Brain cancer Mother        brain tumor   Stroke Father    Alzheimer's disease Father      Social History   Socioeconomic History   Marital status: Married    Spouse name: Not on file   Number of children: Not on file   Years of  education: Not on file   Highest education level: Not on file  Occupational History   Occupation: Full time    Employer: [employer]  Tobacco Use   Smoking status: Never   Smokeless tobacco: Never  Vaping Use   Vaping status: Never Used  Substance and Sexual Activity   Alcohol use: Yes    Alcohol/week: 0.0 standard drinks of alcohol    Comment: RARE   Drug use: No   Sexual activity: Not on file  Other Topics Concern   Not on file  Social History Narrative   Not on file   Social Drivers of Health   Tobacco Use: Low Risk (02/24/2024)   Patient History    Smoking Tobacco Use: Never    Smokeless Tobacco Use: Never    Passive Exposure: Not on file  Financial Resource Strain: Not on file  Food Insecurity: Not on file  Transportation Needs: Not on file  Physical Activity: Not on file  Stress: Not on file  Social Connections: Not on file  Intimate Partner Violence: Not on file  Depression (EYV7-0): Not on file  Alcohol Screen: Not on file  Housing: Not on file  Utilities: Not on file  Health Literacy: Not on file     BP 136/82   Pulse 70   Ht 6' 2 (1.88 m)   Wt 228 lb 4.8 oz (103.6 kg)   SpO2 99%   BMI 29.31 kg/m   Physical Exam:  Well appearing NAD HEENT: Unremarkable Neck:  No JVD, no thyromegally Lymphatics:  No adenopathy Back:  No CVA tenderness Lungs:  Clear HEART:  Regular rate rhythm, no murmurs, no rubs, no clicks Abd:  soft, positive bowel sounds, no organomegally, no rebound, no guarding Ext:  2 plus pulses, no edema, no cyanosis, no clubbing Skin:  No rashes no nodules Neuro:  CN II through XII intact, motor grossly intact  EKG - afib with ventricular pacing  DEVICE  Normal device function.  See PaceArt for details.   Assess/Plan:   Atrial fib - he is out of rhythm now 100% of the time. He is asymptomatic. No plans for NSR has he feels well with his rate controlled. 2. CHB - he is pacing 99% and has no escape today. He is asymptomatic. 3.  CAD - he remains active but denies anginal symptoms. No change in meds. 4. coags - he has not had any bleeding on Xarelto .  5. PPM -his St. Jude biv PPM demonstrates a fracture of his LV lead with an impedence of over 3000 ohms and no capture and diaphragmatic stimulation with his other vectors. In addition his RV only paced QRS is 200 ms where his biv paced QRS was 130!  I discussed the treatment options and  the risks/benefits/goals/expectations of LV lead extraction and insertion of a new resychronization lead were reviewed. He wishes to proceed. Because his battery longevity is about 9 months, he will undergo gen change out as well.  Michael Aldous Housel,MD     [1]  Allergies Allergen Reactions   Acetaminophen  Other (See Comments)    Hallucinations   "

## 2024-05-03 NOTE — Progress Notes (Signed)
 "     HPI [name] returns today for followup. He is a pleasant 73 yo man with a h/o chronic systolic heart failure and persistent atrial fib, s/p DCCV. He has a remote h/o MI, s/p CABG. He has HTN. In the interim, he notes no palpitations, chest pain or sob or edema. No syncope. He notes that he was lifting some heavy equipment a few weeks ago.  Allergies[1]   Current Outpatient Medications  Medication Sig Dispense Refill   atorvastatin  (LIPITOR ) 80 MG tablet TAKE 1 TABLET(80 MG) BY MOUTH DAILY 60 tablet 3   clonazePAM (KLONOPIN) 0.5 MG tablet Take 0.5 mg by mouth at bedtime as needed for anxiety.     metoprolol  (TOPROL -XL) 200 MG 24 hr tablet TAKE 1 TABLET(200 MG) BY MOUTH DAILY 90 tablet 3   nitroGLYCERIN  (NITROSTAT ) 0.4 MG SL tablet Place 0.4 mg under the tongue every 5 (five) minutes as needed for chest pain.     pantoprazole  (PROTONIX ) 40 MG tablet TAKE 1 TABLET BY MOUTH EVERY DAY 90 tablet 2   sacubitril -valsartan  (ENTRESTO ) 97-103 MG TAKE 1 TABLET BY MOUTH TWICE DAILY 180 tablet 1   sildenafil (REVATIO) 20 MG tablet Take 20-100 mg by mouth as needed (30 minutes prior to sexual activity).   6   spironolactone  (ALDACTONE ) 25 MG tablet TAKE 1/2 TABLET BY MOUTH EVERY DAY 45 tablet 2   XARELTO  20 MG TABS tablet TAKE 1 TABLET BY MOUTH EVERY DAY 90 tablet 1   No current facility-administered medications for this visit.     Past Medical History:  Diagnosis Date   AF (atrial fibrillation) (HCC) 12/29/2017   CAD in native artery    08/05/16 Cath, atresia of LIMA to LAD, occlusion of SVG-->RCA, SVG-->Circumflex/OM2, SVG-->OM1, patent SVG to diag, DES to native circumflex.    Dysrhythmia    Erectile dysfunction    Hyperlipidemia, mixed    Myocardial infarction [facility]) 2001   Out of hospital myocardial infarction/inferior wall, status post Cardiolite study 2007.  Ejection fraction 45%   Pacemaker 12/2017   Unspecified essential hypertension     ROS:   All systems reviewed and  negative except as noted in the HPI.   Past Surgical History:  Procedure Laterality Date   BIV PACEMAKER INSERTION CRT-P N/A 12/25/2017   Procedure: BIV PACEMAKER INSERTION CRT-P;  Surgeon: Waddell Danelle ORN, MD;  Location: [facility] INVASIVE CV LAB;  Service: Cardiovascular;  Laterality: N/A;   CARDIOVERSION N/A 02/04/2018   Procedure: CARDIOVERSION;  Surgeon: Alvan Dorn FALCON, MD;  Location: [facility] ENDO SU[facility];  Service: Endoscopy;  Laterality: N/A;   CATARACT EXTRACTION     COLONOSCOPY WITH PROPOFOL  N/A 05/23/2023   Procedure: COLONOSCOPY WITH PROPOFOL ;  Surgeon: Shaaron Lamar HERO, MD;  Location: [facility];  Service: Endoscopy;  Laterality: N/A;  1:45 pm, asa 3   CORONARY ARTERY BYPASS GRAFT  2001   2001.  LIMA to LAD, SVG to OM1, SVG to OM 2, SVG to diagonal, SVG to PDA.   CORONARY BALLOON ANGIOPLASTY N/A 08/05/2016   Procedure: Coronary Balloon Angioplasty;  Surgeon: Candyce GORMAN Reek, MD;  Location: [facility] INVASIVE CV LAB;  Service: Cardiovascular;  Laterality: N/A;  CFX   CORONARY STENT INTERVENTION N/A 08/05/2016   Procedure: Coronary Stent Intervention;  Surgeon: Candyce GORMAN Reek, MD;  Location: [facility] INVASIVE CV LAB;  Service: Cardiovascular;  Laterality: N/A;  OM   LAPAROSCOPIC CHOLECYSTECTOMY  06/22/2013   Dr. Webster   LEFT HEART CATH AND CORS/GRAFTS ANGIOGRAPHY N/A 08/05/2016  Procedure: Left Heart Cath and Cors/Grafts Angiography;  Surgeon: Candyce GORMAN Reek, MD;  Location: [facility] INVASIVE CV LAB;  Service: Cardiovascular;  Laterality: N/A;   POLYPECTOMY  05/23/2023   Procedure: POLYPECTOMY;  Surgeon: Shaaron Lamar HERO, MD;  Location: [facility];  Service: Endoscopy;;     Family History  Problem Relation Age of Onset   Alzheimer's disease Mother    Brain cancer Mother        brain tumor   Stroke Father    Alzheimer's disease Father      Social History   Socioeconomic History   Marital status: Married    Spouse name: Not on file   Number of children: Not on file   Years of  education: Not on file   Highest education level: Not on file  Occupational History   Occupation: Full time    Employer: [employer]  Tobacco Use   Smoking status: Never   Smokeless tobacco: Never  Vaping Use   Vaping status: Never Used  Substance and Sexual Activity   Alcohol use: Yes    Alcohol/week: 0.0 standard drinks of alcohol    Comment: RARE   Drug use: No   Sexual activity: Not on file  Other Topics Concern   Not on file  Social History Narrative   Not on file   Social Drivers of Health   Tobacco Use: Low Risk (02/24/2024)   Patient History    Smoking Tobacco Use: Never    Smokeless Tobacco Use: Never    Passive Exposure: Not on file  Financial Resource Strain: Not on file  Food Insecurity: Not on file  Transportation Needs: Not on file  Physical Activity: Not on file  Stress: Not on file  Social Connections: Not on file  Intimate Partner Violence: Not on file  Depression (EYV7-0): Not on file  Alcohol Screen: Not on file  Housing: Not on file  Utilities: Not on file  Health Literacy: Not on file     BP 136/82   Pulse 70   Ht 6' 2 (1.88 m)   Wt 228 lb 4.8 oz (103.6 kg)   SpO2 99%   BMI 29.31 kg/m   Physical Exam:  Well appearing NAD HEENT: Unremarkable Neck:  No JVD, no thyromegally Lymphatics:  No adenopathy Back:  No CVA tenderness Lungs:  Clear HEART:  Regular rate rhythm, no murmurs, no rubs, no clicks Abd:  soft, positive bowel sounds, no organomegally, no rebound, no guarding Ext:  2 plus pulses, no edema, no cyanosis, no clubbing Skin:  No rashes no nodules Neuro:  CN II through XII intact, motor grossly intact  EKG - afib with ventricular pacing  DEVICE  Normal device function.  See PaceArt for details.   Assess/Plan:   Atrial fib - he is out of rhythm now 100% of the time. He is asymptomatic. No plans for NSR has he feels well with his rate controlled. 2. CHB - he is pacing 99% and has no escape today. He is asymptomatic. 3.  CAD - he remains active but denies anginal symptoms. No change in meds. 4. coags - he has not had any bleeding on Xarelto .  5. PPM -his [employer] biv PPM demonstrates a fracture of his LV lead with an impedence of over 3000 ohms and no capture and diaphragmatic stimulation with his other vectors. In addition his RV only paced QRS is 200 ms where his biv paced QRS was 130!  I discussed the treatment options and  the risks/benefits/goals/expectations of LV lead extraction and insertion of a new resychronization lead were reviewed. He wishes to proceed. Because his battery longevity is about 9 months, he will undergo gen change out as well.  Danelle Aldous Housel,MD     [1]  Allergies Allergen Reactions   Acetaminophen  Other (See Comments)    Hallucinations   "

## 2024-05-03 NOTE — Patient Instructions (Signed)
 Medication Instructions:  Your physician recommends that you continue on your current medications as directed. Please refer to the Current Medication list given to you today.  *If you need a refill on your cardiac medications before your next appointment, please call your pharmacy*  Lab Work: Within 30 days of procedure.  You may go to any [facility] Location for your lab work:  [facility] - [address] ([facility]) - [address] [phone] [address]  [place] - [address]   [place]  - [address]   [place] - [address] - [address] [place]  - [address] ([facility]   If you have labs (blood work) drawn today and your tests are completely normal, you will receive your results only by: [employer] (if you have MyChart)  If you have any lab test that is abnormal or we need to change your treatment, we will call you or send a MyChart message to review the results.  Testing/Procedures: None ordered.  Follow-Up: At [facility], you and your health needs are our priority.  As part of our continuing mission to provide you with exceptional heart care, we have created designated Provider Care Teams.  These Care Teams include your primary Cardiologist (physician) and Advanced Practice Providers (APPs -  Physician Assistants and Nurse Practitioners) who all work together to provide you with the care you need, when you need it.  Your next appointment:   Will call with details

## 2024-05-04 ENCOUNTER — Other Ambulatory Visit: Payer: Self-pay

## 2024-05-04 ENCOUNTER — Other Ambulatory Visit (HOSPITAL_COMMUNITY)
Admission: RE | Admit: 2024-05-04 | Discharge: 2024-05-04 | Disposition: A | Discharge status: Home / Self Care | Source: Ambulatory Visit | Attending: Student in an Organized Health Care Education/Training Program | Admitting: Student in an Organized Health Care Education/Training Program

## 2024-05-04 DIAGNOSIS — I5042 Chronic combined systolic (congestive) and diastolic (congestive) heart failure: Secondary | ICD-10-CM

## 2024-05-05 ENCOUNTER — Other Ambulatory Visit (HOSPITAL_COMMUNITY)
Admission: RE | Admit: 2024-05-05 | Discharge: 2024-05-05 | Disposition: A | Discharge status: Home / Self Care | Source: Ambulatory Visit | Attending: Student in an Organized Health Care Education/Training Program | Admitting: Student in an Organized Health Care Education/Training Program

## 2024-05-05 ENCOUNTER — Other Ambulatory Visit: Payer: Self-pay

## 2024-05-05 ENCOUNTER — Encounter: Payer: Self-pay | Admitting: Emergency Medicine

## 2024-05-05 DIAGNOSIS — I5042 Chronic combined systolic (congestive) and diastolic (congestive) heart failure: Secondary | ICD-10-CM

## 2024-05-05 LAB — CBC
HCT: 47.8 % (ref 39.0–52.0)
Hemoglobin: 15.4 g/dL (ref 13.0–17.0)
MCH: 29.9 pg (ref 26.0–34.0)
MCHC: 32.2 g/dL (ref 30.0–36.0)
MCV: 92.8 fL (ref 80.0–100.0)
Platelets: 181 K/uL (ref 150–400)
RBC: 5.15 MIL/uL (ref 4.22–5.81)
RDW: 12.9 % (ref 11.5–15.5)
WBC: 8.6 K/uL (ref 4.0–10.5)
nRBC: 0 % (ref 0.0–0.2)

## 2024-05-05 LAB — BASIC METABOLIC PANEL WITH GFR
Anion gap: 11 (ref 5–15)
BUN: 19 mg/dL (ref 8–23)
CO2: 26 mmol/L (ref 22–32)
Calcium: 9.3 mg/dL (ref 8.9–10.3)
Chloride: 105 mmol/L (ref 98–111)
Creatinine, Ser: 1 mg/dL (ref 0.61–1.24)
GFR, Estimated: >60 mL/min
Glucose, Bld: 103 mg/dL — ABNORMAL HIGH (ref 70–99)
Potassium: 4.7 mmol/L (ref 3.5–5.1)
Sodium: 142 mmol/L (ref 135–145)

## 2024-05-05 NOTE — Telephone Encounter (Signed)
 Pt requesting c/b regarding labs. Please advise

## 2024-05-05 NOTE — Telephone Encounter (Signed)
 Called pt and explained to him that somehow his lab orders were automatically discontinued out of the system. I apologized to him.  I have placed new lab orders and he will go to [facility] today to have this done. I gave him my direct number to call should he have any trouble getting them done.

## 2024-05-07 ENCOUNTER — Ambulatory Visit: Payer: Self-pay

## 2024-05-07 NOTE — Pre-Procedure Instructions (Signed)
 Attempted to call patient regarding procedure instructions.  Left voicemail on the following items: Arrival time 0530 Nothing to eat or drink after midnight No meds AM of procedure Responsible person to drive you home and stay with you for 24 hrs Wash with special soap night before and morning of procedure If on anti-coagulant drug instructions Xarelto0 last dose should have been 12/29

## 2024-05-10 ENCOUNTER — Observation Stay (HOSPITAL_COMMUNITY)
Admission: RE | Admit: 2024-05-10 | Discharge: 2024-05-11 | Disposition: A | Discharge status: Home / Self Care | Attending: Student in an Organized Health Care Education/Training Program | Admitting: Student in an Organized Health Care Education/Training Program

## 2024-05-10 ENCOUNTER — Encounter (HOSPITAL_COMMUNITY): Payer: Self-pay | Admitting: Certified Registered Nurse Anesthetist

## 2024-05-10 ENCOUNTER — Inpatient Hospital Stay (HOSPITAL_BASED_OUTPATIENT_CLINIC_OR_DEPARTMENT_OTHER): Payer: Self-pay | Admitting: Certified Registered Nurse Anesthetist

## 2024-05-10 ENCOUNTER — Inpatient Hospital Stay (HOSPITAL_COMMUNITY)

## 2024-05-10 ENCOUNTER — Encounter (HOSPITAL_COMMUNITY): Payer: Self-pay | Admitting: Student in an Organized Health Care Education/Training Program

## 2024-05-10 ENCOUNTER — Other Ambulatory Visit: Payer: Self-pay

## 2024-05-10 ENCOUNTER — Ambulatory Visit (HOSPITAL_COMMUNITY)
Admission: RE | Disposition: A | Discharge status: Home / Self Care | Payer: Self-pay | Source: Home / Self Care | Attending: Student in an Organized Health Care Education/Training Program

## 2024-05-10 DIAGNOSIS — I110 Hypertensive heart disease with heart failure: Secondary | ICD-10-CM | POA: Diagnosis not present

## 2024-05-10 DIAGNOSIS — T82110A Breakdown (mechanical) of cardiac electrode, initial encounter: Principal | ICD-10-CM

## 2024-05-10 DIAGNOSIS — Z950 Presence of cardiac pacemaker: Secondary | ICD-10-CM | POA: Diagnosis present

## 2024-05-10 DIAGNOSIS — I5042 Chronic combined systolic (congestive) and diastolic (congestive) heart failure: Secondary | ICD-10-CM | POA: Diagnosis not present

## 2024-05-10 DIAGNOSIS — I2510 Atherosclerotic heart disease of native coronary artery without angina pectoris: Secondary | ICD-10-CM

## 2024-05-10 DIAGNOSIS — Z79899 Other long term (current) drug therapy: Secondary | ICD-10-CM | POA: Insufficient documentation

## 2024-05-10 DIAGNOSIS — I5022 Chronic systolic (congestive) heart failure: Principal | ICD-10-CM | POA: Insufficient documentation

## 2024-05-10 DIAGNOSIS — I4819 Other persistent atrial fibrillation: Secondary | ICD-10-CM | POA: Insufficient documentation

## 2024-05-10 DIAGNOSIS — Z955 Presence of coronary angioplasty implant and graft: Secondary | ICD-10-CM | POA: Insufficient documentation

## 2024-05-10 HISTORY — PX: LEAD INSERTION: EP1212

## 2024-05-10 HISTORY — PX: PPM GENERATOR CHANGEOUT: EP1233

## 2024-05-10 HISTORY — PX: LEAD EXTRACTION: EP1211

## 2024-05-10 LAB — ECHO INTRAOPERATIVE TEE
Height: 74 in
Weight: 3648 [oz_av]

## 2024-05-10 LAB — ABO/RH: ABO/RH(D): O POS

## 2024-05-10 LAB — SURGICAL PCR SCREEN
MRSA, PCR: NEGATIVE
Staphylococcus aureus: NEGATIVE

## 2024-05-10 LAB — PREPARE RBC (CROSSMATCH)

## 2024-05-10 MED ORDER — PANTOPRAZOLE SODIUM 40 MG PO TBEC
40.0000 mg | DELAYED_RELEASE_TABLET | Freq: Every day | ORAL | Status: DC
  Filled 2024-05-10: qty 1

## 2024-05-10 MED ORDER — EPHEDRINE SULFATE-NACL 50-0.9 MG/10ML-% IV SOSY
PREFILLED_SYRINGE | INTRAVENOUS | Status: DC | PRN
  Administered 2024-05-10 (×2): 7.5 mg via INTRAVENOUS

## 2024-05-10 MED ORDER — PROPOFOL 10 MG/ML IV BOLUS
INTRAVENOUS | Status: DC | PRN
  Administered 2024-05-10: 100 mg via INTRAVENOUS

## 2024-05-10 MED ORDER — FENTANYL CITRATE (PF) 100 MCG/2ML IJ SOLN
INTRAMUSCULAR | Status: AC
  Filled 2024-05-10: qty 2

## 2024-05-10 MED ORDER — SODIUM CHLORIDE 0.9 % IV SOLN: INTRAVENOUS | Status: DC

## 2024-05-10 MED ORDER — CEFAZOLIN SODIUM-DEXTROSE 2-4 GM/100ML-% IV SOLN
INTRAVENOUS | Status: AC
  Filled 2024-05-10: qty 100

## 2024-05-10 MED ORDER — POVIDONE-IODINE 10 % EX SWAB
2.0000 | Freq: Once | CUTANEOUS | Status: AC
  Administered 2024-05-10: 2 via TOPICAL

## 2024-05-10 MED ORDER — ONDANSETRON HCL 4 MG/2ML IJ SOLN
INTRAMUSCULAR | Status: DC | PRN
  Administered 2024-05-10: 4 mg via INTRAVENOUS

## 2024-05-10 MED ORDER — LIDOCAINE 2% (20 MG/ML) 5 ML SYRINGE
INTRAMUSCULAR | Status: DC | PRN
  Administered 2024-05-10: 60 mg via INTRAVENOUS

## 2024-05-10 MED ORDER — ATROPINE SULFATE 1 MG/10ML IJ SOSY
PREFILLED_SYRINGE | INTRAMUSCULAR | Status: AC
  Filled 2024-05-10: qty 20

## 2024-05-10 MED ORDER — ATORVASTATIN CALCIUM 80 MG PO TABS
80.0000 mg | ORAL_TABLET | Freq: Every day | ORAL | Status: DC
  Filled 2024-05-10: qty 1

## 2024-05-10 MED ORDER — DEXAMETHASONE SOD PHOSPHATE PF 10 MG/ML IJ SOLN
INTRAMUSCULAR | Status: DC | PRN
  Administered 2024-05-10: 10 mg via INTRAVENOUS

## 2024-05-10 MED ORDER — FENTANYL CITRATE (PF) 100 MCG/2ML IJ SOLN
INTRAMUSCULAR | Status: DC | PRN
  Administered 2024-05-10: 100 ug via INTRAVENOUS

## 2024-05-10 MED ORDER — CHLORHEXIDINE GLUCONATE 0.12 % MT SOLN
OROMUCOSAL | Status: AC
  Administered 2024-05-10: 15 mL
  Filled 2024-05-10: qty 15

## 2024-05-10 MED ORDER — CEFAZOLIN SODIUM-DEXTROSE 2-4 GM/100ML-% IV SOLN
2.0000 g | INTRAVENOUS | Status: AC
  Administered 2024-05-10: 2 g via INTRAVENOUS

## 2024-05-10 MED ORDER — SODIUM CHLORIDE 0.9% IV SOLUTION: Freq: Once | INTRAVENOUS | Status: DC

## 2024-05-10 MED ORDER — SACUBITRIL-VALSARTAN 97-103 MG PO TABS
1.0000 | ORAL_TABLET | Freq: Two times a day (BID) | ORAL | Status: DC
  Filled 2024-05-10: qty 1

## 2024-05-10 MED ORDER — ROCURONIUM BROMIDE 10 MG/ML (PF) SYRINGE
PREFILLED_SYRINGE | INTRAVENOUS | Status: DC | PRN
  Administered 2024-05-10: 10 mg via INTRAVENOUS
  Administered 2024-05-10: 60 mg via INTRAVENOUS
  Administered 2024-05-10: 40 mg via INTRAVENOUS

## 2024-05-10 MED ORDER — NOREPINEPHRINE 4 MG/250ML-% IV SOLN
INTRAVENOUS | Status: DC | PRN
  Administered 2024-05-10: 2 ug/min via INTRAVENOUS

## 2024-05-10 MED ORDER — SODIUM CHLORIDE 0.9 % IV SOLN: 80.0000 mg | INTRAVENOUS | Status: DC

## 2024-05-10 MED ORDER — SUGAMMADEX SODIUM 200 MG/2ML IV SOLN
INTRAVENOUS | Status: DC | PRN
  Administered 2024-05-10: 200 mg via INTRAVENOUS

## 2024-05-10 MED ORDER — CHLORHEXIDINE GLUCONATE 4 % EX SOLN: 4.0000 | Freq: Once | CUTANEOUS | Status: DC

## 2024-05-10 MED ORDER — ONDANSETRON HCL 4 MG/2ML IJ SOLN: 4.0000 mg | Freq: Four times a day (QID) | INTRAMUSCULAR | Status: DC | PRN

## 2024-05-10 MED ORDER — METOPROLOL SUCCINATE ER 200 MG PO TB24
200.0000 mg | ORAL_TABLET | Freq: Once | ORAL | Status: AC
  Administered 2024-05-10: 200 mg via ORAL
  Filled 2024-05-10: qty 1

## 2024-05-10 MED ORDER — HEPARIN (PORCINE) IN NACL 1000-0.9 UT/500ML-% IV SOLN
INTRAVENOUS | Status: DC | PRN
  Administered 2024-05-10: 500 mL

## 2024-05-10 MED ORDER — LACTATED RINGERS IV SOLN: INTRAVENOUS | Status: DC | PRN

## 2024-05-10 MED ORDER — LACTATED RINGERS IV SOLN: INTRAVENOUS | Status: DC

## 2024-05-10 MED ORDER — ACETAMINOPHEN 325 MG PO TABS: 325.0000 mg | ORAL_TABLET | ORAL | Status: DC | PRN

## 2024-05-10 MED ORDER — METOPROLOL SUCCINATE ER 25 MG PO TB24
ORAL_TABLET | ORAL | Status: AC
  Filled 2024-05-10: qty 8

## 2024-05-10 MED ORDER — SODIUM CHLORIDE 0.9 % IV SOLN
INTRAVENOUS | Status: AC
  Filled 2024-05-10: qty 2

## 2024-05-10 NOTE — Care Management CC44 (Signed)
"         Condition Code 44 Documentation Completed  Patient Details  Name: Michael Melton MRN: [id number] Date of Birth: July 08, 1950   Condition Code 44 given:  Yes Patient signature on Condition Code 44 notice:  Yes Documentation of 2 MD's agreement:  Yes Code 44 added to claim:  Yes    Merilee LOISE Batty, RN 05/10/2024, 6:28 PM  "

## 2024-05-10 NOTE — Anesthesia Postprocedure Evaluation (Signed)
"   Anesthesia Post Note  Patient: Michael Melton  Procedure(s) Performed: LEAD EXTRACTION PPM GENERATOR CHANGEOUT LEAD INSERTION     Patient location during evaluation: Cath Lab Anesthesia Type: General Level of consciousness: awake Pain management: pain level controlled Vital Signs Assessment: post-procedure vital signs reviewed and stable Respiratory status: spontaneous breathing, nonlabored ventilation and respiratory function stable Cardiovascular status: blood pressure returned to baseline and stable Postop Assessment: no apparent nausea or vomiting Anesthetic complications: no   No notable events documented.  Last Vitals:  Vitals:   05/10/24 1727 05/10/24 2017  BP: 137/85 130/74  Pulse: 73 73  Resp: 18 18  Temp: 36.7 C 36.9 C  SpO2: 96% 96%    Last Pain:  Vitals:   05/10/24 2017  TempSrc: Oral  PainSc:                  Michael Melton      "

## 2024-05-10 NOTE — Care Management Obs Status (Signed)
 MEDICARE OBSERVATION STATUS NOTIFICATION   Patient Details  Name: Michael Melton MRN: [id number] Date of Birth: 08/23/1950   Medicare Observation Status Notification Given:  Yes    Merilee LOISE Batty, RN 05/10/2024, 6:28 PM

## 2024-05-10 NOTE — Interval H&P Note (Signed)
 History and Physical Interval Note:  05/10/2024 6:51 AM  Charlie MARLA Para  has presented today for surgery, with the diagnosis of lead fracture.  The various methods of treatment have been discussed with the patient and family. After consideration of risks, benefits and other options for treatment, the patient has consented to  Procedures: LEAD EXTRACTION (N/A) PPM GENERATOR CHANGEOUT (N/A) LEAD INSERTION (N/A) as a surgical intervention.  The patient's history has been reviewed, patient examined, no change in status, stable for surgery.  I have reviewed the patient's chart and labs.  Questions were answered to the patient's satisfaction.    Xarelto  LD last Monday. Reviewed risks, benefits and alternatives to LV lead extraction and reimplant. 1/200 risk of sternotomy, 3-5% risk of infection. Overnight observation, discharge after IP admission.  Donnice DELENA Primus

## 2024-05-10 NOTE — Anesthesia Preprocedure Evaluation (Addendum)
"                                    Anesthesia Evaluation  Patient identified by MRN, date of birth, ID band Patient awake    Reviewed: Allergy & Precautions, NPO status , Patient's Chart, lab work & pertinent test results  Airway Mallampati: III  TM Distance: >3 FB Neck ROM: Full    Dental no notable dental hx.    Pulmonary neg pulmonary ROS   Pulmonary exam normal        Cardiovascular hypertension, Pt. on home beta blockers and Pt. on medications + CAD, + Past MI, + Cardiac Stents and + CABG  Normal cardiovascular exam+ dysrhythmias Atrial Fibrillation + pacemaker      Neuro/Psych negative neurological ROS     GI/Hepatic negative GI ROS, Neg liver ROS,,,  Endo/Other  negative endocrine ROS    Renal/GU negative Renal ROS     Musculoskeletal negative musculoskeletal ROS (+)    Abdominal   Peds  Hematology  (+) Blood dyscrasia (Xarelto )   Anesthesia Other Findings lead fracture  Reproductive/Obstetrics                              Anesthesia Physical Anesthesia Plan  ASA: 3  Anesthesia Plan: General   Post-op Pain Management:    Induction: Intravenous  PONV Risk Score and Plan: 2 and Ondansetron , Dexamethasone  and Treatment may vary due to age or medical condition  Airway Management Planned: Oral ETT  Additional Equipment: Arterial line and TEE  Intra-op Plan:   Post-operative Plan: Extubation in OR  Informed Consent: I have reviewed the patients History and Physical, chart, labs and discussed the procedure including the risks, benefits and alternatives for the proposed anesthesia with the patient or authorized representative who has indicated his/her understanding and acceptance.     Dental advisory given  Plan Discussed with: CRNA  Anesthesia Plan Comments: (TEE for intraoperative monitoring only )         Anesthesia Quick Evaluation  "

## 2024-05-10 NOTE — Op Note (Signed)
 "  Procedure:  Extraction of chronically implanted LV/CS pacing lead (CPT 33234) Reimplant of LV/CS pacing lead (CPT [phone]) CRTP generator replacement (CPT 33231)   Pre-op  Diagnosis: LV/CS lead failure/fracture, HFrEF/NICM, CHB   Post-op Diagnosis: same   Procedure Date: 05/10/24   Attending: Adina Primus, MD, Michael Birmingham, MD    Surgical Backup: Michael Clunes, MD    Anesthesia: GA   Procedure Description: Description of the procedure: After informed consent was obtained, the patient was taken to the EP lab in the fasting state. After usual preparation draping, the anesthesia service was utilized to provide general endotracheal anesthesia and invasive arterial hemodynamic monitoring. A 7 French sheath was inserted percutaneously in the right femoral vein. TEE with trivial effusion and no TR at baseline.   At this point attention was turned to the CRTP pocket. A 5 cm incision was carried out and electrocautery utilized to dissect down to the pacemaker generator which was removed with gentle traction. The leads were freed up from the surrounding tissue with electrocautery. L subclavian vein access was obtained with a standard access needle and ultrasound guidance.  A Glidewire was advanced to the IVC without difficulty. The RA and RV leads were assessed and the RA lead had no obvious defects. The RV lead had outer insulation abrasion with noise when interacting with other wires. Silicone was applied and 2 suture sleeves were fastened over the insulation breach without issue. These were secured with silk ties (x2 for each), 2 suture sleeves.   Next attention was turned to the LV/CS lead. The sewing sleeve was freed from the lead and the silk suture was removed. A stylet was advanced down the RA and RV leads to allow for better lead stability. A stylet was advanced through the LV/CS lead and was able to be advanced into the proximal CS but was unable to pass the proximal quadripolar electrode. The  lead was cut and prepped. An E locking stylet was advanced through the LV/CS lead and encountered the same area of resistance proximal to the fourth quadripolar electrode. The E locking stylet was secured. A Fiberwire suture was used to secure the insulation and 2 half hitch notes were formed around the LV/CS lead.   Next a 13 Fr TightRail Sub-C was advanced over the LV/CS lead and was able to free the lead past several binding sites with progression to the R brachiocephalic. At this point it was exchanged for a 11 Fr TightRail without outer sheath. The LV/CS lead had retracted from the CS and was in the mid RA. It remained bound and fibrous tissue around the RA and RV leads. It was eventually freed in its entirety and removed. The RA lead had a loop in the fibrous tissue that was unable to be advanced back into the RA. All lead parameters remained stable. Trivial effusion was stable from prior. No TR was evident on TEE evaluation.   The Abbott CS guide was advanced over the Glidewire but there was proximal resistance before the venotomy access. A 9.5 Fr short sheath was advanced over the Glidewire to allow for easier access. The Abbott CS guide was then freely advanced to the mid RA. The Glidewire was then advanced into the CS and cannulated a PL branch. A selective venogram through a Renal LVI confirmed appropriate guide placement. The LV lead was advanced without difficulty. All parameters were satisfactory with LV 4 and LV 3 being more basal. The CS guide was split/remove and the LV/CS pacing lead remained  in a stable position. The lead was secured to the fascia with two 0-silk sutures. One additional suture was used for both the RA and RV leads.    The device was then connected to the leads. The pocket was irrigated with gentamicin  solution. A TYRX pouch was used. The device was affixed to the underlying fascia with a single 0-silk suture. The pocket was closed with continuous 2-0 V-Loc and 3-0 Stratafix.  Dermabond was applied over the incision.    The device was evaluated by the representative of the cardiac device manufacturer with implant parameters listed below.    The RFV access site was closed with Perclose ProStyle suture x1 with good hemostasis.   Implanted Hardware: Implant Name Type Inv. Item Serial No. Manufacturer Lot No. LRB No. Used Action  LEAD QUARTET 1458Q-86CM - [id number] Lead LEAD QUARTET 1458Q-86CM [id number] [employer]  N/A 1 Implanted  POUCH AIGIS-R ANTIBACT PPM MED - [id number] Mesh General POUCH AIGIS-R ANTIBACT PPM MED  [employer] RHYTHM [employer]NAG[employer] [id number] N/A 1 Implanted  PACEMAKER QUDR ALLR CRT [id number] - [id number] Pacemaker PACEMAKER QUDR ALLR CRT [id number] [employer]  N/A 1 Implanted  CLOSURE PERCLOSE PROSTYLE - [id number] Vascular Products CLOSURE PERCLOSE PROSTYLE  [employer] [id number] N/A 1 Implanted   Device Information: CRTP: Abbott Michael Melton MP, SN [id number], DOI 05/10/24 RA: SJM Tendril MRI LPA 1200M/52 cm, SN [id number], DOI 12/25/17 RV ICD: SJM Tendril MRI LPA1200M/58 cm, SN [id number], DOI 12/25/17 LV: SJM Quartet 1458Q/86 cm, SN [id number], DOI 05/10/24   Lead Interrogation Data:  RV Sensing Amplitude: none (CHB) RV Pace/Sense Impedance: 460 ohms  RV Capture Threshold: 1.0 V @ 0.8 ms  LV Sensing Amplitude: none (CHB) LV Pace/Sense Impedance: 1150 ohms LV Capture Threshold: 1.0 V @ 0.8 ms  Summary: Successful LV/CS pacing lead extraction and reimplant  CRTP generator replacement   Recommendations: Routine post-procedure care with bedrest for 3 hours No heparin  (IV or subcutaneous) for 48 hours. No enoxaparin (IV or subcutaneous) for 7 days.  Resume Xarelto  20 mg at bedtime on Thursday (05/13/24) PA/lateral CXR in AM   Wound check in AM Device interrogation in AM  Michael DELENA Primus, MD [facility]  Cardiac Electrophysiology        "

## 2024-05-10 NOTE — Anesthesia Procedure Notes (Signed)
 Procedure Name: Intubation Date/Time: 05/10/2024 7:52 AM  Performed by: Mannie Krystal LABOR, CRNAPre-anesthesia Checklist: Patient identified, Emergency Drugs available, Suction available and Patient being monitored Patient Re-evaluated:Patient Re-evaluated prior to induction Oxygen Delivery Method: Circle system utilized Preoxygenation: Pre-oxygenation with 100% oxygen Induction Type: IV induction Ventilation: Mask ventilation without difficulty Laryngoscope Size: Mac and 4 Grade View: Grade II Tube type: Oral Tube size: 7.5 mm Number of attempts: 1 Airway Equipment and Method: Stylet Placement Confirmation: ETT inserted through vocal cords under direct vision, positive ETCO2 and breath sounds checked- equal and bilateral Secured at: 23 cm Tube secured with: Tape Dental Injury: Teeth and Oropharynx as per pre-operative assessment

## 2024-05-10 NOTE — Transfer of Care (Signed)
 Immediate Anesthesia Transfer of Care Note  Patient: Michael Melton Para  Procedure(s) Performed: LEAD EXTRACTION PPM GENERATOR CHANGEOUT LEAD INSERTION  Patient Location: PACU  Anesthesia Type:General  Level of Consciousness: awake, alert , and oriented  Airway & Oxygen Therapy: Patient Spontanous Breathing and Patient connected to face mask oxygen  Post-op Assessment: Report given to RN and Post -op Vital signs reviewed and stable  Post vital signs: Reviewed and stable  Last Vitals:  Vitals Value Taken Time  BP    Temp    Pulse    Resp    SpO2      Last Pain:  Vitals:   05/10/24 0658  TempSrc:   PainSc: 0-No pain         Complications: No notable events documented.

## 2024-05-10 NOTE — Anesthesia Procedure Notes (Signed)
 Arterial Line Insertion Start/End1/09/2024 7:00 AM, 05/10/2024 7:05 AM Performed by: Patrisha Bernardino SQUIBB, MD, Mannie Krystal LABOR, CRNA, CRNA  Patient location: Pre-op . Preanesthetic checklist: patient identified, IV checked, site marked, risks and benefits discussed, surgical consent, monitors and equipment checked, pre-op  evaluation, timeout performed and anesthesia consent Lidocaine  1% used for infiltration Right, radial was placed Catheter size: 20 G Hand hygiene performed , maximum sterile barriers used  and Seldinger technique used Allen's test indicative of satisfactory collateral circulation Attempts: 1 Procedure performed using ultrasound to evaluate access site. Ultrasound Notes:relevant anatomy identified, ultrasound used to visualize needle entry and vessel patent under ultrasound. Following insertion, Biopatch. Post procedure assessment: normal  Patient tolerated the procedure well with no immediate complications.

## 2024-05-10 NOTE — Progress Notes (Signed)
 Brief Note  I was present and available to immediately assist with lead extraction for Mr. Bondar in conjunction with EP cardiologists, Dr. Almetta and Dr. Waddell.  Following lead extraction, there was a small, stable effusion that had been present prior to the procedure.  Con Clunes, MD Cardiothoracic Surgery Pager: [phone]

## 2024-05-11 ENCOUNTER — Encounter (HOSPITAL_COMMUNITY): Payer: Self-pay | Admitting: Student in an Organized Health Care Education/Training Program

## 2024-05-11 ENCOUNTER — Observation Stay (HOSPITAL_COMMUNITY)

## 2024-05-11 DIAGNOSIS — Z950 Presence of cardiac pacemaker: Secondary | ICD-10-CM

## 2024-05-11 LAB — TYPE AND SCREEN
ABO/RH(D): O POS
Antibody Screen: NEGATIVE
Unit division: 0
Unit division: 0
Unit division: 0
Unit division: 0

## 2024-05-11 LAB — BPAM RBC
Blood Product Expiration Date: 202601282359
Blood Product Expiration Date: 202601292359
Blood Product Expiration Date: 202601292359
Blood Product Expiration Date: 202601312359
ISSUE DATE / TIME: 202601011729
ISSUE DATE / TIME: 202601050735
ISSUE DATE / TIME: 202601050735
Unit Type and Rh: 5100
Unit Type and Rh: 5100
Unit Type and Rh: 5100
Unit Type and Rh: 5100

## 2024-05-11 MED FILL — Atropine Sulfate Soln Prefill Syr 1 MG/10ML (0.1 MG/ML): INTRAMUSCULAR | Qty: 10 | Status: AC

## 2024-05-11 MED FILL — Gentamicin Sulfate Inj 40 MG/ML: INTRAMUSCULAR | Qty: 80 | Status: AC

## 2024-05-11 NOTE — Discharge Instructions (Addendum)
 After Your ICD (Implantable Cardiac Defibrillator)   You have a Abbott ICD  If you have a Medtronic or Biotronik device, plug in your home monitor once you get home, and no manual interaction is required.   If you have an Abbott or [employer] device, plug your home monitor once you get home, sit near the device, and press the large activation button. Sit nearby until the process is complete, usually notated by lights on the monitor.   If you were set up for monitoring using an app on your phone, make sure the app remains open in the background and the Bluetooth remains on.  ACTIVITY Do not lift your arm above shoulder height for 1 week after your procedure. After 7 days, you may progress as below.  You should remove your sling 24 hours after your procedure, unless otherwise instructed by your provider.     Tuesday May 18, 2024  Wednesday May 19, 2024 Thursday May 20, 2024 Friday May 21, 2024   Do not lift, push, pull, or carry anything over 10 pounds with the affected arm until 6 weeks (Tuesday June 22, 2024 ) after your procedure.   You may drive AFTER your wound check, UNLESS you have been told otherwise by your provider.   Ask your healthcare provider when you can go back to work   INCISION/Dressing HOLD Xarelto  post implant.  Restart on 05/13/24 PM   Do not remove steri-strips or glue as below.   Monitor your defibrillator site for redness, swelling, and drainage. Call the device clinic at [phone] if you experience these symptoms or fever/chills.    If your incision is sealed with Dermabond (glue), you may shower 24 hrs after your procedure or when told by your provider. Do not remove the glue or let the shower hit directly on your site. You may wash around your site with soap and water .    If you were discharged in a sling, please do not wear this during the day more than 48 hours after your surgery unless otherwise instructed. This may increase  the risk of stiffness and soreness in your shoulder.   Avoid lotions, ointments, or perfumes over your incision until it is well-healed.  You may use a hot tub or a pool AFTER your wound check appointment if the incision is completely closed.  Your ICD is designed to protect you from life threatening heart rhythms. Because of this, you may receive a shock.   1 shock with no symptoms:  Call the office during business hours. 1 shock with symptoms (chest pain, chest pressure, dizziness, lightheadedness, shortness of breath, overall feeling unwell):  Call 911. If you experience 2 or more shocks in 24 hours:  Call 911. If you receive a shock, you should not drive for 6 months per the [place] DMV IF you receive appropriate therapy from your ICD.   ICD Alerts:  Some alerts are vibratory and others beep. These are NOT emergencies. Please call our office to let us  know. If this occurs at night or on weekends, it can wait until the next business day. Send a remote transmission.  If your device is capable of reading fluid status (for heart failure), you will be offered monthly monitoring to review this with you.   DEVICE MANAGEMENT Remote monitoring is used to monitor your ICD from home. This monitoring is scheduled every 91 days by our office. It allows us  to keep an eye on the functioning of your device to ensure it  is working properly. You will routinely see your Electrophysiologist annually (more often if necessary). This will appear as a REMOTE check on your MyChart schedule. These are automatic and there is nothing for you to manually do unless otherwise instructed.  You should receive your ID card for your new device in 4-8 weeks. Keep this card with you at all times once received. Consider wearing a medical alert bracelet or necklace.  Your ICD  may be MRI compatible. This will be discussed at your next office visit/wound check.  You should avoid contact with strong electric or magnetic fields.   Do  not use amateur (ham) radio equipment or electric (arc) welding torches. MP3 player headphones with magnets should not be used. Some devices are safe to use if held at least 12 inches (30 cm) from your defibrillator. These include power tools, lawn mowers, and speakers. If you are unsure if something is safe to use, ask your health care provider.  When using your cell phone, hold it to the ear that is on the opposite side from the defibrillator. Do not leave your cell phone in a pocket over the defibrillator.  You may safely use electric blankets, heating pads, computers, and microwave ovens.  Call the office right away if: You have chest pain. You feel more than one shock. You feel more short of breath than you have felt before. You feel more light-headed than you have felt before. Your incision starts to open up.  This information is not intended to replace advice given to you by your health care provider. Make sure you discuss any questions you have with your health care provider.

## 2024-05-11 NOTE — Care Management (Signed)
 Spoke w patient at bedside, no needs identified for home. ICM signing off

## 2024-05-11 NOTE — Discharge Summary (Signed)
 "     ELECTROPHYSIOLOGY PROCEDURE DISCHARGE SUMMARY    Patient ID: Michael Melton,  MRN: [id number], DOB/AGE: [birth year] 74 y.o.  Admit date: 05/10/2024 Discharge date: 05/11/2024  Primary Care Physician: Atilano Deward ORN, MD  Primary Cardiologist: Alvan Carrier, MD  Electrophysiologist: Dr. Almetta    Primary Diagnosis:  Chronic Systolic CHF    Secondary Diagnosis: Persistent Atrial Fibrillation  MI s/p CABG  HTN  Allergies[1]   Procedures This Admission:  Extraction of a LV/CS lead due to fracture 2.  Implantation of a Abbott CS lead on 05/11/23 by Dr. Almetta.  The patient received a Abbott Quadra Allure MP [id number]  with existing Abbott Tendril LPA1200M right atrial lead and existing Abbott Tendril LPA1200M right ventricular lead. Pt received a Abbott Quartet 1458Q LV lead. DFTs were deferred at time of implant There were no post procedure complications 3.  CXR on 05/12/23 demonstrated no pneumothorax status post device implantation.      Brief HPI:  Michael Melton is a 74 y.o. male with PMH of HFrEF, CHB s/p CRT-Ppersistent AF, MI, CABG who was followed by Dr. Waddell for  for extraction of ICD due to LV/CS lead fracture.  Past medical history includes above. Risks, benefits, and alternatives to ICD extraction were reviewed with the patient who wished to proceed.   Hospital Course:  The patient was admitted and extraction completed on 05/11/23.  The pt then underwent implantation of a Abbott CS lead with details as outlined above.  They were monitored on telemetry overnight which demonstrated appropriate pacing. RV threshold was noted to be elevated - 2V @ 1ms (prior 1.5V at 1ms) and impedence reduced at 360 ohms (from 460 ohms). Intra-op decision was to not extract the RV lead given no prior discussion and risks of extraction. This was reviewed with patient and family post procedure, will monitor RV lead impedence / threshold for now. Left chest was without hematoma or  ecchymosis.  The device was interrogated and found to be functioning normally.  CXR was obtained and demonstrated no pneumothorax status post device implantation..  Wound care, arm mobility, and restrictions were reviewed with the patient.  The patient was examined and considered stable for discharge to home.   The patient's discharge medications include Entresto  and beta blocker (Metoprol).  Anticoagulation resumption This patient should resume their Xarelto  on 05/13/24 PM   Physical Exam: Vitals:   05/10/24 2017 05/10/24 2340 05/11/24 0431 05/11/24 0906  BP: 130/74 122/63 117/70 (!) 141/77  Pulse: 73 73 72 75  Resp: 18 18 16 17   Temp: 98.4 F (36.9 C) (!) 97.3 F (36.3 C) 97.8 F (36.6 C) 98 F (36.7 C)  TempSrc: Oral Oral Oral Oral  SpO2: 96% 95% 96% 96%  Weight:      Height:        GEN- NAD. A&O x 3.  HEENT: Normocephalic, atraumatic Lungs- CTAB, normal effort.  Heart- RRR. No M/G/R.  GI- Soft, NT, ND.  Extremities- No clubbing, cyanosis, or edema Skin- Warm and dry, no rash or lesion. ICD site within normal limits - no significant edema or ecchymosis  Discharge Medications:  Allergies as of 05/11/2024       Reactions   Acetaminophen  Other (See Comments)   Hallucinations        Medication List     PAUSE taking these medications    Xarelto  20 MG Tabs tablet Wait to take this until: May 13, 2024 Evening Generic drug: rivaroxaban  TAKE 1 TABLET BY MOUTH  EVERY DAY       TAKE these medications    atorvastatin  80 MG tablet Commonly known as: LIPITOR  TAKE 1 TABLET(80 MG) BY MOUTH DAILY   metoprolol  200 MG 24 hr tablet Commonly known as: TOPROL -XL TAKE 1 TABLET(200 MG) BY MOUTH DAILY   nitroGLYCERIN  0.4 MG SL tablet Commonly known as: NITROSTAT  Place 0.4 mg under the tongue every 5 (five) minutes as needed for chest pain.   pantoprazole  40 MG tablet Commonly known as: PROTONIX  TAKE 1 TABLET BY MOUTH EVERY DAY   sacubitril -valsartan  97-103  MG Commonly known as: ENTRESTO  TAKE 1 TABLET BY MOUTH TWICE DAILY   sildenafil 20 MG tablet Commonly known as: REVATIO Take 20-100 mg by mouth as needed (30 minutes prior to sexual activity).   spironolactone  25 MG tablet Commonly known as: ALDACTONE  TAKE 1/2 TABLET BY MOUTH EVERY DAY        Disposition: Home with usual follow up as in AVS  Duration of Discharge Encounter:  APP time: 32 minutes  Signed, Daphne Barrack, NP-C, AGACNP-BC  [facility] Elect[facility]iology  05/11/2024, 10:27 AM         [1]  Allergies Allergen Reactions   Acetaminophen  Other (See Comments)    Hallucinations   "

## 2024-05-11 NOTE — Plan of Care (Signed)
   Problem: Education: Goal: Knowledge of cardiac device and self-care will improve Outcome: Progressing Goal: Ability to safely manage health related needs after discharge will improve Outcome: Progressing Goal: Individualized Educational Video(s) Outcome: Progressing   Problem: Cardiac: Goal: Ability to achieve and maintain adequate cardiopulmonary perfusion will improve Outcome: Progressing

## 2024-05-20 ENCOUNTER — Ambulatory Visit: Attending: Cardiology

## 2024-05-20 DIAGNOSIS — I442 Atrioventricular block, complete: Secondary | ICD-10-CM

## 2024-05-20 DIAGNOSIS — I4891 Unspecified atrial fibrillation: Secondary | ICD-10-CM

## 2024-05-20 NOTE — Patient Instructions (Signed)
" ° °  After Your Pacemaker   Monitor your pacemaker site for redness, swelling, and drainage. Call the device clinic at [phone] if you experience these symptoms or fever/chills.  Your incision was closed with Dermabond:  You may shower 1 day after your defibrillator implant and wash your incision with soap and water . Avoid lotions, ointments, or perfumes over your incision until it is well-healed.  You may use a hot tub or a pool after your wound check appointment if the incision is completely closed.  Do not lift, push or pull greater than 10 pounds with the affected arm until 6 weeks after your procedure. UNTIL AFTER FEBRUARY 16TH. There are no other restrictions in arm movement after your wound check appointment.  You may drive, unless driving has been restricted by your healthcare providers.  Remote monitoring is used to monitor your pacemaker from home. This monitoring is scheduled every 91 days by our office. It allows us  to keep an eye on the functioning of your device to ensure it is working properly. You will routinely see your Electrophysiologist annually (more often if necessary).  "

## 2024-05-21 LAB — CUP PACEART INCLINIC DEVICE CHECK
Battery Remaining Longevity: 37 mo
Battery Voltage: 2.99 V
Brady Statistic RA Percent Paced: 0 %
Brady Statistic RV Percent Paced: 98 %
Date Time Interrogation Session: 20260115125700
Implantable Lead Connection Status: 753985
Implantable Lead Connection Status: 753985
Implantable Lead Connection Status: 753985
Implantable Lead Implant Date: 20190822
Implantable Lead Implant Date: 20190822
Implantable Lead Implant Date: 20260105
Implantable Lead Location: 753858
Implantable Lead Location: 753859
Implantable Lead Location: 753860
Implantable Pulse Generator Implant Date: 20260105
Lead Channel Impedance Value: 1312.5 Ohm
Lead Channel Impedance Value: 1337.5 Ohm
Lead Channel Impedance Value: 375 Ohm
Lead Channel Impedance Value: 375 Ohm
Lead Channel Pacing Threshold Amplitude: 1.125 V
Lead Channel Pacing Threshold Amplitude: 1.25 V
Lead Channel Pacing Threshold Amplitude: 1.25 V
Lead Channel Pacing Threshold Amplitude: 2 V
Lead Channel Pacing Threshold Amplitude: 2 V
Lead Channel Pacing Threshold Amplitude: 2.25 V
Lead Channel Pacing Threshold Pulse Width: 0.5 ms
Lead Channel Pacing Threshold Pulse Width: 0.5 ms
Lead Channel Pacing Threshold Pulse Width: 0.5 ms
Lead Channel Pacing Threshold Pulse Width: 0.7 ms
Lead Channel Pacing Threshold Pulse Width: 1 ms
Lead Channel Pacing Threshold Pulse Width: 1 ms
Lead Channel Sensing Intrinsic Amplitude: 4.9 mV
Lead Channel Sensing Intrinsic Amplitude: 4.9 mV
Lead Channel Setting Pacing Amplitude: 3.5 V
Lead Channel Setting Pacing Amplitude: 4 V
Lead Channel Setting Pacing Pulse Width: 0.5 ms
Lead Channel Setting Pacing Pulse Width: 1 ms
Lead Channel Setting Sensing Sensitivity: 4 mV
Pulse Gen Model: 3562
Pulse Gen Serial Number: 8333396

## 2024-05-21 NOTE — Progress Notes (Signed)
 Normal CRT-P pacemaker wound check. Presenting rhythm: AF/BVP 71.  BPV: 98%.  Wound well healed.  There is noted a soft, healing hematoma present in pocket, patient is on Xarelto .  Reviewed with Dr. Inocencio in office, decision to continue Xarelto  and monitor for any concerns, patient educated and device clinic number given to call if needed.  Routine testing performed. Thresholds, sensing, and impedance consistent with implant measurements and auto/cap confirm programmed on with appropriate safety margin. No episodes. Reviewed arm restrictions to continue for 6 weeks total post op.  Pt enrolled in remote follow-up.  Patient set up with new remote monitor today in clinic, remote appts scheduled.   NOTE:  Existing RV lead repaired with silicone during procedure exhibited elevated thresholds at discharge.  RV testing today is stable at 2.0v@1 .0ms.  LV lead presents with excellent threshold at 1.0v@.59ms. Reviewed with [employer] rep today.  Given recent issue with RV lead, decision made to change from a hard programmed RV output at 4.0v@1 .0ms to turning on cap confirm for both RV and LV leads to ensure remote monitoring of leads performance.

## 2024-05-23 ENCOUNTER — Ambulatory Visit: Payer: Self-pay | Admitting: Student in an Organized Health Care Education/Training Program

## 2024-06-09 ENCOUNTER — Encounter

## 2024-06-09 LAB — CUP PACEART REMOTE DEVICE CHECK
Battery Remaining Longevity: 53 mo
Battery Remaining Percentage: 95.5 %
Battery Voltage: 2.99 V
Date Time Interrogation Session: 20260204020009
Implantable Lead Connection Status: 753985
Implantable Lead Connection Status: 753985
Implantable Lead Connection Status: 753985
Implantable Lead Implant Date: 20190822
Implantable Lead Implant Date: 20190822
Implantable Lead Implant Date: 20260105
Implantable Lead Location: 753858
Implantable Lead Location: 753859
Implantable Lead Location: 753860
Implantable Pulse Generator Implant Date: 20260105
Lead Channel Impedance Value: 1350 Ohm
Lead Channel Impedance Value: 390 Ohm
Lead Channel Pacing Threshold Amplitude: 0.875 V
Lead Channel Pacing Threshold Amplitude: 3.125 V
Lead Channel Pacing Threshold Pulse Width: 0.5 ms
Lead Channel Pacing Threshold Pulse Width: 0.7 ms
Lead Channel Sensing Intrinsic Amplitude: 5.7 mV
Lead Channel Setting Pacing Amplitude: 2 V
Lead Channel Setting Pacing Amplitude: 4.125
Lead Channel Setting Pacing Pulse Width: 0.5 ms
Lead Channel Setting Pacing Pulse Width: 0.7 ms
Lead Channel Setting Sensing Sensitivity: 4 mV
Pulse Gen Model: 3562
Pulse Gen Serial Number: 8333396

## 2024-08-11 ENCOUNTER — Ambulatory Visit: Admitting: Cardiology

## 2024-09-08 ENCOUNTER — Encounter

## 2024-12-08 ENCOUNTER — Encounter
